# Patient Record
Sex: Male | Born: 1980 | Race: Black or African American | Hispanic: No | Marital: Single | Smoking: Former smoker
Health system: Southern US, Community
[De-identification: ages and names within clinical notes are randomized; demographics above are authoritative.]

## PROBLEM LIST (undated history)

## (undated) DIAGNOSIS — I119 Hypertensive heart disease without heart failure: Secondary | ICD-10-CM

## (undated) DIAGNOSIS — I10 Essential (primary) hypertension: Secondary | ICD-10-CM

## (undated) DIAGNOSIS — M549 Dorsalgia, unspecified: Secondary | ICD-10-CM

## (undated) DIAGNOSIS — Z9889 Other specified postprocedural states: Secondary | ICD-10-CM

## (undated) DIAGNOSIS — I428 Other cardiomyopathies: Secondary | ICD-10-CM

## (undated) HISTORY — DX: Other cardiomyopathies: I42.8

## (undated) HISTORY — DX: Other specified postprocedural states: Z98.890

## (undated) HISTORY — DX: Hypertensive heart disease without heart failure: I11.9

---

## 1999-03-25 ENCOUNTER — Emergency Department (HOSPITAL_COMMUNITY): Admission: EM | Admit: 1999-03-25 | Discharge: 1999-03-25 | Payer: Self-pay | Admitting: Emergency Medicine

## 2002-02-26 ENCOUNTER — Emergency Department (HOSPITAL_COMMUNITY): Admission: EM | Admit: 2002-02-26 | Discharge: 2002-02-26 | Payer: Self-pay | Admitting: Emergency Medicine

## 2002-02-26 ENCOUNTER — Encounter: Payer: Self-pay | Admitting: Emergency Medicine

## 2002-03-05 ENCOUNTER — Emergency Department (HOSPITAL_COMMUNITY): Admission: EM | Admit: 2002-03-05 | Discharge: 2002-03-05 | Payer: Self-pay | Admitting: *Deleted

## 2007-01-06 ENCOUNTER — Emergency Department (HOSPITAL_COMMUNITY): Admission: EM | Admit: 2007-01-06 | Discharge: 2007-01-06 | Payer: Self-pay | Admitting: Family Medicine

## 2013-07-09 ENCOUNTER — Encounter (HOSPITAL_COMMUNITY): Payer: Self-pay | Admitting: Emergency Medicine

## 2013-07-09 ENCOUNTER — Emergency Department (INDEPENDENT_AMBULATORY_CARE_PROVIDER_SITE_OTHER)
Admission: EM | Admit: 2013-07-09 | Discharge: 2013-07-09 | Disposition: A | Discharge status: Home / Self Care | Payer: Self-pay | Source: Home / Self Care

## 2013-07-09 DIAGNOSIS — B86 Scabies: Secondary | ICD-10-CM

## 2013-07-09 MED ORDER — PERMETHRIN 5 % EX CREA: TOPICAL_CREAM | CUTANEOUS | Status: DC

## 2013-07-09 NOTE — ED Provider Notes (Signed)
CSN: [id number]     Arrival date & time 07/09/13  1322 History   First MD Initiated Contact with Patient 07/09/13 1728     Chief Complaint  Patient presents with  . Rash   (Consider location/radiation/quality/duration/timing/severity/associated sxs/prior Treatment) HPI Comments: 32 year old male presents with complaints of an itchy rash for possibly 4-6 weeks. It starts out with vesiculopapular lesions in the interdigital web spaces of the hands and in the feet. There are lesions and papules in the hands along the waistband line of the abdomen and in the groin. 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   History reviewed. No pertinent past medical history. History reviewed. No pertinent past surgical history. No family history on file. History  Substance Use Topics  . Smoking status: Current Every Day Smoker  . Smokeless tobacco: Not on file  . Alcohol Use: Yes    Review of Systems  Constitutional: Negative.   Skin: Positive for rash.       As per history of present illness  All other systems reviewed and are negative.    Allergies  Penicillins  Home Medications   Current Outpatient Rx  Name  Route  Sig  Dispense  Refill  . permethrin (ELIMITE) 5 % cream      Apply from chin to feet, rinse off in 8 hours. May repeat in 1 w willeek prn.   1 g   1    BP 190/104  Pulse 102  Temp(Src) 98.7 F (37.1 C) (Oral)  Resp 16  SpO2 100% Physical Exam  Nursing note and vitals reviewed. Constitutional: He appears well-developed and well-nourished. No distress.  Neck: Normal range of motion. Neck supple.  Pulmonary/Chest: Effort normal. No respiratory distress.  Musculoskeletal: He exhibits no edema.  Neurological: He is alert. He exhibits normal muscle tone.  Skin: Skin is warm and dry. Rash noted.  Papulovesicular lesions in the interdigital web spaces of the hands, along the waist where the elastic band of  the underwear touches the skin and in the thighs and scrotum. No erythema. No drainage or bleeding.  Psychiatric: He has a normal mood and affect.    ED Course  Procedures (including critical care time) Labs Review Labs Reviewed - No data to display Imaging Review No results found.    MDM   1. Scabies   2. Scabies infestation      Elimite as dir Discussed  Cleaning and treating sheets, clothes, furniture, pillow , etc. Insturctions  Hayden Rasmussen, NP 07/09/13 1742

## 2013-07-09 NOTE — ED Notes (Signed)
Generalized rash including in between fingers.  Rash itches, worse at night.  Patient reports he has had this for a month.  Patient thinks this rash is scabies

## 2013-07-11 NOTE — ED Provider Notes (Signed)
Medical screening examination/treatment/procedure(s) were performed by a resident physician or non-physician practitioner and as the supervising physician I was immediately available for consultation/collaboration.  Clementeen Graham, MD    Rodolph Bong, MD 07/11/13 [phone]

## 2013-08-16 ENCOUNTER — Ambulatory Visit: Payer: Self-pay | Admitting: Internal Medicine

## 2013-09-27 ENCOUNTER — Ambulatory Visit: Payer: Self-pay | Admitting: Internal Medicine

## 2013-10-29 DIAGNOSIS — Z0000 Encounter for general adult medical examination without abnormal findings: Secondary | ICD-10-CM | POA: Insufficient documentation

## 2013-11-12 DIAGNOSIS — I10 Essential (primary) hypertension: Secondary | ICD-10-CM | POA: Insufficient documentation

## 2013-11-15 ENCOUNTER — Encounter: Payer: Self-pay | Admitting: Medical

## 2014-05-15 ENCOUNTER — Inpatient Hospital Stay (HOSPITAL_COMMUNITY)
Admission: EM | Admit: 2014-05-15 | Discharge: 2014-05-17 | DRG: 287 | Disposition: A | Discharge status: Home / Self Care | Payer: Self-pay | Attending: Cardiovascular Disease | Admitting: Cardiovascular Disease

## 2014-05-15 ENCOUNTER — Other Ambulatory Visit: Payer: Self-pay

## 2014-05-15 ENCOUNTER — Encounter (HOSPITAL_COMMUNITY): Payer: Self-pay | Admitting: Cardiology

## 2014-05-15 ENCOUNTER — Emergency Department (HOSPITAL_COMMUNITY): Payer: Self-pay

## 2014-05-15 DIAGNOSIS — I10 Essential (primary) hypertension: Secondary | ICD-10-CM

## 2014-05-15 DIAGNOSIS — R079 Chest pain, unspecified: Secondary | ICD-10-CM

## 2014-05-15 DIAGNOSIS — I2510 Atherosclerotic heart disease of native coronary artery without angina pectoris: Secondary | ICD-10-CM | POA: Diagnosis present

## 2014-05-15 DIAGNOSIS — E876 Hypokalemia: Secondary | ICD-10-CM | POA: Diagnosis present

## 2014-05-15 DIAGNOSIS — I517 Cardiomegaly: Secondary | ICD-10-CM

## 2014-05-15 DIAGNOSIS — I43 Cardiomyopathy in diseases classified elsewhere: Secondary | ICD-10-CM | POA: Diagnosis present

## 2014-05-15 DIAGNOSIS — I2102 ST elevation (STEMI) myocardial infarction involving left anterior descending coronary artery: Secondary | ICD-10-CM

## 2014-05-15 DIAGNOSIS — I119 Hypertensive heart disease without heart failure: Principal | ICD-10-CM | POA: Diagnosis present

## 2014-05-15 DIAGNOSIS — Z9119 Patient's noncompliance with other medical treatment and regimen: Secondary | ICD-10-CM | POA: Diagnosis present

## 2014-05-15 DIAGNOSIS — I77810 Thoracic aortic ectasia: Secondary | ICD-10-CM | POA: Diagnosis present

## 2014-05-15 DIAGNOSIS — I161 Hypertensive emergency: Secondary | ICD-10-CM | POA: Diagnosis present

## 2014-05-15 HISTORY — DX: Dorsalgia, unspecified: M54.9

## 2014-05-15 HISTORY — DX: Essential (primary) hypertension: I10

## 2014-05-15 LAB — COMPREHENSIVE METABOLIC PANEL
ALT: 17 U/L (ref 0–53)
AST: 29 U/L (ref 0–37)
Albumin: 4 g/dL (ref 3.5–5.2)
Alkaline Phosphatase: 77 U/L (ref 39–117)
Anion gap: 14 (ref 5–15)
BUN: 9 mg/dL (ref 6–23)
CHLORIDE: 95 meq/L — AB (ref 96–112)
CO2: 24 mEq/L (ref 19–32)
Calcium: 9.3 mg/dL (ref 8.4–10.5)
Creatinine, Ser: 0.84 mg/dL (ref 0.50–1.35)
GFR calc Af Amer: >90 mL/min (ref >90)
GFR calc non Af Amer: >90 mL/min (ref >90)
Glucose, Bld: 113 mg/dL — ABNORMAL HIGH (ref 70–99)
Potassium: 4.4 mEq/L (ref 3.7–5.3)
Sodium: 133 mEq/L — ABNORMAL LOW (ref 137–147)
Total Bilirubin: 1.3 mg/dL — ABNORMAL HIGH (ref 0.3–1.2)
Total Protein: 9.2 g/dL — ABNORMAL HIGH (ref 6.0–8.3)

## 2014-05-15 LAB — CBC
HCT: 41.3 % (ref 39.0–52.0)
HEMOGLOBIN: 14.7 g/dL (ref 13.0–17.0)
MCH: 30.6 pg (ref 26.0–34.0)
MCHC: 35.6 g/dL (ref 30.0–36.0)
MCV: 86 fL (ref 78.0–100.0)
Platelets: 322 10*3/uL (ref 150–400)
RBC: 4.8 MIL/uL (ref 4.22–5.81)
RDW: 14.7 % (ref 11.5–15.5)
WBC: 13.2 10*3/uL — ABNORMAL HIGH (ref 4.0–10.5)

## 2014-05-15 LAB — PROTIME-INR
INR: 1.08 (ref 0.00–1.49)
Prothrombin Time: 14.2 seconds (ref 11.6–15.2)

## 2014-05-15 LAB — APTT: APTT: 37 s (ref 24–37)

## 2014-05-15 LAB — I-STAT TROPONIN, ED: Troponin i, poc: 0.06 ng/mL (ref 0.00–0.08)

## 2014-05-15 LAB — MRSA PCR SCREENING: MRSA by PCR: NEGATIVE

## 2014-05-15 MED ORDER — HEPARIN SODIUM (PORCINE) 5000 UNIT/ML IJ SOLN
INTRAMUSCULAR | Status: AC
  Filled 2014-05-15: qty 1

## 2014-05-15 MED ORDER — ONDANSETRON HCL 4 MG/2ML IJ SOLN: 4.0000 mg | Freq: Four times a day (QID) | INTRAMUSCULAR | Status: DC | PRN

## 2014-05-15 MED ORDER — ONDANSETRON HCL 4 MG/2ML IJ SOLN
INTRAMUSCULAR | Status: AC
  Filled 2014-05-15: qty 2

## 2014-05-15 MED ORDER — LABETALOL HCL 5 MG/ML IV SOLN
INTRAVENOUS | Status: AC
  Filled 2014-05-15: qty 4

## 2014-05-15 MED ORDER — LIDOCAINE HCL (PF) 1 % IJ SOLN
INTRAMUSCULAR | Status: AC
  Filled 2014-05-15: qty 30

## 2014-05-15 MED ORDER — CETYLPYRIDINIUM CHLORIDE 0.05 % MT LIQD
7.0000 mL | Freq: Two times a day (BID) | OROMUCOSAL | Status: DC
  Administered 2014-05-15 – 2014-05-17 (×4): 7 mL via OROMUCOSAL

## 2014-05-15 MED ORDER — DIAZEPAM 5 MG PO TABS
5.0000 mg | ORAL_TABLET | Freq: Three times a day (TID) | ORAL | Status: DC | PRN
  Administered 2014-05-16 – 2014-05-17 (×3): 5 mg via ORAL
  Filled 2014-05-15 (×3): qty 1

## 2014-05-15 MED ORDER — CARVEDILOL 12.5 MG PO TABS
12.5000 mg | ORAL_TABLET | Freq: Two times a day (BID) | ORAL | Status: DC
  Administered 2014-05-15 – 2014-05-16 (×2): 12.5 mg via ORAL
  Filled 2014-05-15 (×4): qty 1

## 2014-05-15 MED ORDER — FENTANYL CITRATE 0.05 MG/ML IJ SOLN
INTRAMUSCULAR | Status: AC
  Filled 2014-05-15: qty 2

## 2014-05-15 MED ORDER — HEPARIN SODIUM (PORCINE) 5000 UNIT/ML IJ SOLN
5000.0000 [IU] | Freq: Three times a day (TID) | INTRAMUSCULAR | Status: DC
  Administered 2014-05-15 – 2014-05-17 (×5): 5000 [IU] via SUBCUTANEOUS
  Filled 2014-05-15 (×8): qty 1

## 2014-05-15 MED ORDER — PROMETHAZINE HCL 25 MG/ML IJ SOLN
25.0000 mg | Freq: Once | INTRAMUSCULAR | Status: AC
  Administered 2014-05-15: 25 mg via INTRAVENOUS
  Filled 2014-05-15: qty 1

## 2014-05-15 MED ORDER — METOPROLOL TARTRATE 1 MG/ML IV SOLN: 5.0000 mg | Freq: Once | INTRAVENOUS | Status: DC

## 2014-05-15 MED ORDER — HEPARIN SODIUM (PORCINE) 1000 UNIT/ML IJ SOLN
INTRAMUSCULAR | Status: AC
  Filled 2014-05-15: qty 1

## 2014-05-15 MED ORDER — ACETAMINOPHEN 325 MG PO TABS
650.0000 mg | ORAL_TABLET | ORAL | Status: DC | PRN
  Administered 2014-05-15 – 2014-05-16 (×2): 650 mg via ORAL
  Filled 2014-05-15 (×3): qty 2

## 2014-05-15 MED ORDER — HEPARIN (PORCINE) IN NACL 2-0.9 UNIT/ML-% IJ SOLN
INTRAMUSCULAR | Status: AC
  Filled 2014-05-15: qty 1500

## 2014-05-15 MED ORDER — VERAPAMIL HCL 2.5 MG/ML IV SOLN
INTRAVENOUS | Status: AC
  Filled 2014-05-15: qty 2

## 2014-05-15 MED ORDER — INFLUENZA VAC SPLIT QUAD 0.5 ML IM SUSY
0.5000 mL | PREFILLED_SYRINGE | INTRAMUSCULAR | Status: AC
  Administered 2014-05-17: 0.5 mL via INTRAMUSCULAR
  Filled 2014-05-15: qty 0.5

## 2014-05-15 MED ORDER — SODIUM CHLORIDE 0.9 % IV SOLN
INTRAVENOUS | Status: AC
  Administered 2014-05-15: 15:00:00 via INTRAVENOUS

## 2014-05-15 MED ORDER — ONDANSETRON HCL 4 MG/2ML IJ SOLN
4.0000 mg | Freq: Once | INTRAMUSCULAR | Status: AC
  Administered 2014-05-15: 4 mg via INTRAVENOUS

## 2014-05-15 MED ORDER — METOPROLOL TARTRATE 1 MG/ML IV SOLN
INTRAVENOUS | Status: AC
  Filled 2014-05-15: qty 10

## 2014-05-15 MED ORDER — NITROGLYCERIN IN D5W 200-5 MCG/ML-% IV SOLN
0.0000 ug/min | Freq: Once | INTRAVENOUS | Status: AC
  Administered 2014-05-15: 5 ug/min via INTRAVENOUS

## 2014-05-15 MED ORDER — HEPARIN SODIUM (PORCINE) 5000 UNIT/ML IJ SOLN: 4000.0000 [IU] | Freq: Once | INTRAMUSCULAR | Status: DC

## 2014-05-15 MED ORDER — MIDAZOLAM HCL 2 MG/2ML IJ SOLN
INTRAMUSCULAR | Status: AC
  Filled 2014-05-15: qty 2

## 2014-05-15 MED ORDER — NITROGLYCERIN IN D5W 200-5 MCG/ML-% IV SOLN
2.0000 ug/min | INTRAVENOUS | Status: DC
  Administered 2014-05-15: 40 ug/min via INTRAVENOUS
  Administered 2014-05-16: 50 ug/min via INTRAVENOUS
  Filled 2014-05-15: qty 250

## 2014-05-15 MED ORDER — HYDRALAZINE HCL 20 MG/ML IJ SOLN
INTRAMUSCULAR | Status: AC
  Filled 2014-05-15: qty 1

## 2014-05-15 MED ORDER — AMLODIPINE BESYLATE 10 MG PO TABS
10.0000 mg | ORAL_TABLET | Freq: Every day | ORAL | Status: DC
  Administered 2014-05-15 – 2014-05-17 (×3): 10 mg via ORAL
  Filled 2014-05-15 (×3): qty 1

## 2014-05-15 MED ORDER — NITROGLYCERIN IN D5W 200-5 MCG/ML-% IV SOLN
INTRAVENOUS | Status: AC
  Administered 2014-05-15: 50000 ug
  Filled 2014-05-15: qty 250

## 2014-05-15 MED ORDER — NITROGLYCERIN 1 MG/10 ML FOR IR/CATH LAB
INTRA_ARTERIAL | Status: AC
  Filled 2014-05-15: qty 10

## 2014-05-15 MED ORDER — METOPROLOL TARTRATE 1 MG/ML IV SOLN
5.0000 mg | Freq: Once | INTRAVENOUS | Status: AC
  Administered 2014-05-15: 5 mg via INTRAVENOUS

## 2014-05-15 NOTE — Progress Notes (Signed)
Right radial TR band removed, radial site level 0 & pulse +3.  Gauze dressing applied and patient given instructions on movement of right arm.  Will continue to monitor. [place], Mitzi Hansen

## 2014-05-15 NOTE — H&P (Signed)
   Chief Complaint:  ST elevation MI  HPI:  This is a 33 y.o. male with a past medical history significant for the absence of major medical illnesses. He suspectss he has HBP, never treated. Woke at [facility] with severe precordial heaviness, shooting down the left arm and unable to lie flat due to orthopnea. No such complaints in the past.   PMHx:  Past Medical History  Diagnosis Date  . Hypertension   . Back pain     History reviewed. No pertinent past surgical history.  FAMHx:  History reviewed. No pertinent family history. Grandfather with heart disease but not at young age  SOCHx:   reports that he has been smoking.  He does not have any smokeless tobacco history on file. He reports that he drinks alcohol. He reports that he uses illicit drugs (Marijuana).  ALLERGIES:  Allergies  Allergen Reactions  . Penicillins     ROS: Constitutional: negative for chills, fevers, sweats and weight loss Eyes: negative Ears, nose, mouth, throat, and face: negative Respiratory: positive for dyspnea on exertion, negative for cough, hemoptysis, sputum and wheezing Cardiovascular: positive for chest pain, dyspnea and orthopnea, negative for chest pain, dyspnea, orthopnea and paroxysmal nocturnal dyspnea Gastrointestinal: negative for abdominal pain, change in bowel habits, dysphagia, melena, nausea, reflux symptoms and vomiting Genitourinary:negative Integument/breast: negative Hematologic/lymphatic: negative for bleeding, easy bruising and petechiae Musculoskeletal:negative Neurological: negative Behavioral/Psych: negative Endocrine: negative for diabetic symptoms including polydipsia, polyphagia and polyuria Allergic/Immunologic: negative  HOME MEDS:  (Not in a hospital admission)  LABS/IMAGING: No results found for this or any previous visit (from the past 48 hour(s)). No results found.   CXR shows cardiomegaly and a mildly widened mediastinum  VITALS: Blood pressure 220/146,  pulse 108, temperature 98.9 F (37.2 C), resp. rate 18, SpO2 99 %. Equal BP left and right arms EXAM:  General: Alert, oriented x3, no distress Head: no evidence of trauma, PERRL, EOMI, no exophtalmos or lid lag, no myxedema, no xanthelasma; normal ears, nose and oropharynx Neck: normal jugular venous pulsations and no hepatojugular reflux; brisk carotid pulses without delay and no carotid bruits Chest: clear to auscultation, no signs of consolidation by percussion or palpation, normal fremitus, symmetrical and full respiratory excursions Cardiovascular: normal position and quality of the apical impulse, regular rhythm, normal first heart sound and normal second heart sounds, no rubs, loud S4, no murmur Abdomen: no tenderness or distention, no masses by palpation, no abnormal pulsatility or arterial bruits, normal bowel sounds, no hepatosplenomegaly Extremities: no clubbing, cyanosis or edema; 2+ radial, ulnar and brachial pulses bilaterally; 2+ right femoral, posterior tibial and dorsalis pedis pulses; 2+ left femoral, posterior tibial and dorsalis pedis pulses; no subclavian or femoral bruits Neurological: grossly nonfocal   IMPRESSION: Possible anteroseptal STEMI - symptoms consistent with unstable angina and secondary CHF, but ECG interpretation is not as confident due to marked LVH with prominent repol changes.  Hold off anticoagulants until we know he does not have a dissection  PLAN: Emergency cardiac cath. No anticoagulants until dissection ruled out.  IV beta blockers and NTG. ASA given en route. This procedure has been fully reviewed with the patient and written informed consent has been obtained.   Thurmon Fair, MD, [facility] [phone] office [phone] pager  05/15/2014, 11:24 AM

## 2014-05-15 NOTE — ED Notes (Signed)
Pt placed into gown and on monitor  Upon arrival to room. Pts EKG given to and signed by Dr. Jodi Mourning. Pt monitored by blood pressure, pulse ox, and 12 lead.

## 2014-05-15 NOTE — ED Notes (Signed)
Pt to department via EMS- pt reports that he started having left sided chest pain that started about 7 hours again. Started in the left arm and now in his chest. Pt reports some SOB and diaphoresis. 324 ASA, 3 SL nitro. 18 LAC. Pt reports that he vomited earlier and reports ETOH/[place]. Bp-210/147 HR-106

## 2014-05-15 NOTE — Plan of Care (Signed)
Problem: Consults Goal: Nutrition Consult-if indicated Outcome: Not Applicable Date Met:  05/15/14     

## 2014-05-15 NOTE — ED Notes (Addendum)
Provider at the bedside. Code Stemi called by Dr. Jodi Mourning. Zoll pads placed to patients chest.

## 2014-05-15 NOTE — Progress Notes (Signed)
I responded to STEMI page for 33 year-old patient who was taken to the Cath Lab.  I introduced myself to mother who was with patient's friend in the ED waiting room.  I then had to attend to another call. When I returned, I met family in Cath Lab waiting area.  I checked in with doctors and asked for a time frame for when an update would come.  I returned to family and offered prayer and emotional and spiritual support.  I waited with family until the doctor came to give a report. I then accompanied family to [facility] waiting room and checked in with nurse to inform her that family was waiting to see patient.  I checked in with patient and mom later in the day.  Mom was bedside with patient who was nauseous. Mother is nearly blind and depends heavily on son. She states that they are very close.  She has had a series of her own health issues in the past year, including hypertension (the same problem as her son is having), cervical cancer, which appears to be in remission, and glaucoma. I will recommend spiritual care follow up with mother.    Minus Liberty, chaplain [phone]

## 2014-05-15 NOTE — ED Notes (Signed)
Cardiology at the bedside.

## 2014-05-15 NOTE — CV Procedure (Signed)
    Cardiac Catheterization Procedure Note  Name: Jeremy [name] MRN: [id number] DOB: 1981-02-01  Procedure: Left Heart Cath, Selective Coronary Angiography, LV angiography, Aortic root angiography  Indication: Chest pain, EKG changes concerning for anterior injury (STEMI)   Procedural Details: The right wrist was prepped, draped, and anesthetized with 1% lidocaine. Using the modified Seldinger technique, a 5/6 French Slender sheath was introduced into the right radial artery. 3 mg of verapamil was administered through the sheath, weight-based unfractionated heparin was administered intravenously. Standard Judkins catheters were used for selective coronary angiography, aortic root angiography, and left ventriculography. Catheter exchanges were performed over an exchange length guidewire. There were no immediate procedural complications. A TR band was used for radial hemostasis at the completion of the procedure.  The patient was transferred to the post catheterization recovery area for further monitoring.  Procedural Findings: Hemodynamics: AO 182/119 LV 189/18  Coronary angiography: Coronary dominance: right  Left mainstem: widely patent vessel with mild irregularity, divides into the LAD and left circumflex.  Left anterior descending (LAD): very large vessel that reaches the left ventricular apex. The vessel is patent throughout. 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 There is 25-30% stenosis associated with this. The vessel appears widely patent during diastole. The diagonal branches are patent.  Left circumflex (LCx): very large vessel supplying 2 obtuse marginal branches. No obstructive disease is noted.  Right coronary artery (RCA): large, dominant vessel with minimal luminal irregularity. The acute marginal branch, PDA, and PLA branches are all patent.  Left ventriculography: there is mild diffuse global hypokinesis with severe hypokinesis of the inferior wall.  The LVEF is estimated at 45%.  Aortic root angiogram: The aortic root is dilated. There is no aortic insufficiency or evidence of aortic dissection. Using the radiographic measurement tool, the aortic root dimension at its largest appears to be 4.5 cm in diameter.  Estimated Blood Loss: minimal  Final Conclusions:   1. Widely patent coronary arteries with minimal diffuse irregularity 2. 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 Dilated ascending aorta  Recommendations: IV nitroglycerin and initiation of oral antihypertensive medications. Aim for blood pressure of 160-180 mmHg in this patient who presents with very severe hypertension. Check 2-D echocardiogram.  Tonny Bollman MD, [facility] 05/15/2014, 2:28 PM

## 2014-05-15 NOTE — ED Notes (Signed)
Transported to the cath lab.

## 2014-05-15 NOTE — ED Notes (Signed)
Lopressor 5mg  given. Cards at the bedside

## 2014-05-15 NOTE — ED Provider Notes (Signed)
CSN: [id number]     Arrival date & time 05/15/14  1105 History   First MD Initiated Contact with Patient 05/15/14 1105     Chief Complaint  Patient presents with  . Chest Pain    Patient is a 33 y.o. male presenting with chest pain. The history is provided by the patient and the EMS personnel.  Chest Pain Pain location:  Substernal area and L chest Pain quality: aching   Pain radiates to:  L arm Pain radiates to the back: no   Pain severity:  Severe Onset quality:  Sudden Duration:  2 hours Timing:  Constant Progression:  Improving (with nitro) Chronicity:  New Context: at rest   Relieved by:  Nitroglycerin and aspirin Worsened by:  Nothing tried Ineffective treatments:  None tried Associated symptoms: diaphoresis, nausea, shortness of breath and vomiting   Associated symptoms: no abdominal pain and no fever   Nausea:    Severity:  Mild   Onset quality:  Gradual   Duration:  1 day   Timing:  Constant   Progression:  Resolved Vomiting:    Quality:  Stomach contents   Severity:  Mild   Progression:  Resolved Risk factors: hypertension (uncontrolled), male sex and smoking   Risk factors: no coronary artery disease and no prior DVT/PE     Past Medical History  Diagnosis Date  . Hypertension   . Back pain    History reviewed. No pertinent past surgical history. History reviewed. No pertinent family history. History  Substance Use Topics  . Smoking status: Current Every Day Smoker  . Smokeless tobacco: Not on file  . Alcohol Use: Yes    Review of Systems  Constitutional: Positive for diaphoresis. Negative for fever.  Respiratory: Positive for shortness of breath.   Cardiovascular: Positive for chest pain.  Gastrointestinal: Positive for nausea and vomiting. Negative for abdominal pain.  Skin: Negative for rash.  All other systems reviewed and are negative.     Allergies  Penicillins  Home Medications   Prior to Admission medications   Medication Sig  Start Date End Date Taking? Authorizing Provider  permethrin (ELIMITE) 5 % cream Apply from chin to feet, rinse off in 8 hours. May repeat in 1 w willeek prn. 07/09/13   Hayden Rasmussenavid Mabe, NP   BP 220/146 mmHg  Pulse 108  Temp(Src) 98.9 F (37.2 C)  Resp 18  SpO2 99%   Physical Exam  Constitutional: He is oriented to person, place, and time. He appears well-developed and well-nourished. No distress.  HENT:  Head: Normocephalic and atraumatic.  Right Ear: External ear normal.  Left Ear: External ear normal.  Mouth/Throat: Oropharynx is clear and moist.  Eyes: EOM are normal. Pupils are equal, round, and reactive to light.  Neck: Normal range of motion and phonation normal.  Cardiovascular: Normal rate and regular rhythm.   Pulses:      Radial pulses are 2+ on the right side, and 2+ on the left side.       Dorsalis pedis pulses are 2+ on the right side, and 2+ on the left side.  Pulmonary/Chest: Effort normal and breath sounds normal. No respiratory distress. He has no wheezes. He has no rales.  Abdominal: Soft. He exhibits no distension. There is no tenderness. There is no rebound and no guarding.  Neurological: He is alert and oriented to person, place, and time.  Skin: Skin is warm and dry. No rash noted. He is not diaphoretic.  Psychiatric: He has a normal  mood and affect.  Vitals reviewed.   ED Course  Procedures  Labs Review Labs Reviewed  CBC - Abnormal; Notable for the following:    WBC 13.2 (*)    All other components within normal limits  APTT  PROTIME-INR  COMPREHENSIVE METABOLIC PANEL  I-STAT TROPOININ, ED    Imaging Review No results found.   EKG shows STEMI in anterior leads  MDM   Final diagnoses:  Chest pain    Code STEMI called after obtaining EKG on arrival due to chest pain, diaphoresis, N/V and ST elevation.  Cardiologist arrived at the bedside immediately. Given heparin bolus and started on a drip. Started on a nitro drip. Given aspirin en route.    He remained hemodynamically acceptable in the ED and was taken emergently to the Cath Lab.   This case managed in conjunction with my attending, Dr. Jodi Mourning.     Maxine Glenn, MD 05/15/14 [phone]

## 2014-05-16 DIAGNOSIS — I517 Cardiomegaly: Secondary | ICD-10-CM

## 2014-05-16 DIAGNOSIS — I429 Cardiomyopathy, unspecified: Secondary | ICD-10-CM

## 2014-05-16 LAB — BASIC METABOLIC PANEL
Anion gap: 13 (ref 5–15)
BUN: 11 mg/dL (ref 6–23)
CALCIUM: 9 mg/dL (ref 8.4–10.5)
CO2: 25 mEq/L (ref 19–32)
Chloride: 98 mEq/L (ref 96–112)
Creatinine, Ser: 1.11 mg/dL (ref 0.50–1.35)
GFR calc Af Amer: >90 mL/min (ref >90)
GFR calc non Af Amer: 85 mL/min — ABNORMAL LOW (ref >90)
GLUCOSE: 102 mg/dL — AB (ref 70–99)
Potassium: 3.5 mEq/L — ABNORMAL LOW (ref 3.7–5.3)
SODIUM: 136 meq/L — AB (ref 137–147)

## 2014-05-16 LAB — CBC
HCT: 38.9 % — ABNORMAL LOW (ref 39.0–52.0)
HEMOGLOBIN: 13.6 g/dL (ref 13.0–17.0)
MCH: 30 pg (ref 26.0–34.0)
MCHC: 35 g/dL (ref 30.0–36.0)
MCV: 85.9 fL (ref 78.0–100.0)
PLATELETS: 307 10*3/uL (ref 150–400)
RBC: 4.53 MIL/uL (ref 4.22–5.81)
RDW: 14.7 % (ref 11.5–15.5)
WBC: 10.3 10*3/uL (ref 4.0–10.5)

## 2014-05-16 LAB — POCT I-STAT, CHEM 8
BUN: 7 mg/dL (ref 6–23)
Calcium, Ion: 1.15 mmol/L (ref 1.12–1.23)
Chloride: 99 mEq/L (ref 96–112)
Creatinine, Ser: 0.9 mg/dL (ref 0.50–1.35)
Glucose, Bld: 119 mg/dL — ABNORMAL HIGH (ref 70–99)
HCT: 50 % (ref 39.0–52.0)
Hemoglobin: 17 g/dL (ref 13.0–17.0)
Potassium: 3.7 mEq/L (ref 3.7–5.3)
SODIUM: 136 meq/L — AB (ref 137–147)
TCO2: 24 mmol/L (ref 0–100)

## 2014-05-16 MED ORDER — LISINOPRIL 10 MG PO TABS
10.0000 mg | ORAL_TABLET | Freq: Every day | ORAL | Status: DC
  Administered 2014-05-16: 10 mg via ORAL
  Filled 2014-05-16 (×2): qty 1

## 2014-05-16 MED ORDER — CARVEDILOL 12.5 MG PO TABS
12.5000 mg | ORAL_TABLET | Freq: Once | ORAL | Status: AC
  Administered 2014-05-16: 12.5 mg via ORAL
  Filled 2014-05-16: qty 1

## 2014-05-16 MED ORDER — POTASSIUM CHLORIDE CRYS ER 20 MEQ PO TBCR
40.0000 meq | EXTENDED_RELEASE_TABLET | Freq: Once | ORAL | Status: AC
  Administered 2014-05-16: 40 meq via ORAL
  Filled 2014-05-16: qty 2

## 2014-05-16 MED ORDER — CARVEDILOL 25 MG PO TABS
25.0000 mg | ORAL_TABLET | Freq: Two times a day (BID) | ORAL | Status: DC
  Administered 2014-05-16 – 2014-05-17 (×2): 25 mg via ORAL
  Filled 2014-05-16 (×4): qty 1

## 2014-05-16 NOTE — Progress Notes (Signed)
SUBJECTIVE:  Pt seen and examined in AM. No acute events overnight. Blood pressure has improved to156/81 this AM. He denies chest pain, palpitations, or dyspnea.   OBJECTIVE:   Vitals:   Filed Vitals:   05/16/14 0731 05/16/14 0800 05/16/14 0807 05/16/14 0900  BP: 159/102  173/108 156/81  Pulse: 93  91   Temp: 98.5 F (36.9 C) 98.2 F (36.8 C)    TempSrc: Oral Oral    Resp: 19     Height:      Weight:      SpO2: 92% 94%     I&O's:   Intake/Output Summary (Last 24 hours) at 05/16/14 0928 Last data filed at 05/16/14 0900  Gross per 24 hour  Intake 1794.72 ml  Output    970 ml  Net 824.72 ml   TELEMETRY: Reviewed telemetry pt in normal sinus rhythm.     PHYSICAL EXAM General: Well developed, well nourished, in no acute distress Head:   Normal cephalic and atramatic  Lungs:  Clear bilaterally to auscultation. Heart:  HRRR S1 S2  No JVD.   Abdomen: Abdomen soft and non-tender Msk:  Back normal,  Normal strength and tone for age. Extremities:  No edema.   Neuro: Alert and oriented. Psych:  Normal affect, responds appropriately   LABS: Basic Metabolic Panel:  Recent Labs  16/04/9610/01/15 1114 05/16/14 0243  NA 133* 136*  K 4.4 3.5*  CL 95* 98  CO2 24 25  GLUCOSE 113* 102*  BUN 9 11  CREATININE 0.84 1.11  CALCIUM 9.3 9.0   Liver Function Tests:  Recent Labs  05/15/14 1114  AST 29  ALT 17  ALKPHOS 77  BILITOT 1.3*  PROT 9.2*  ALBUMIN 4.0   No results for input(s): LIPASE, AMYLASE in the last 72 hours. CBC:  Recent Labs  05/15/14 1114 05/16/14 0243  WBC 13.2* 10.3  HGB 14.7 13.6  HCT 41.3 38.9*  MCV 86.0 85.9  PLT 322 307   Cardiac Enzymes: No results for input(s): CKTOTAL, CKMB, CKMBINDEX, TROPONINI in the last 72 hours. BNP: Invalid input(s): POCBNP D-Dimer: No results for input(s): DDIMER in the last 72 hours. Hemoglobin A1C: No results for input(s): HGBA1C in the last 72 hours. Fasting Lipid Panel: No results for input(s): CHOL, HDL,  LDLCALC, TRIG, CHOLHDL, LDLDIRECT in the last 72 hours. Thyroid Function Tests: No results for input(s): TSH, T4TOTAL, T3FREE, THYROIDAB in the last 72 hours.  Invalid input(s): FREET3 Anemia Panel: No results for input(s): VITAMINB12, FOLATE, FERRITIN, TIBC, IRON, RETICCTPCT in the last 72 hours. Coag Panel:   Lab Results  Component Value Date   INR 1.08 05/15/2014    RADIOLOGY: Dg Chest Portable 1 View  05/15/2014   CLINICAL DATA:  Left-sided chest pain and difficulty breathing  EXAM: PORTABLE CHEST - 1 VIEW  COMPARISON:  None.  FINDINGS: There is no edema or consolidation. Heart is enlarged with pulmonary vascularity within normal limits. No adenopathy. No pneumothorax. No bone lesions.  IMPRESSION: Cardiomegaly no edema or consolidation.   Electronically Signed   By: Bretta BangWilliam  Woodruff M.D.   On: 05/15/2014 12:08      ASSESSMENT: Pt is a 33 yo man with pmh of hypertension non-compliant with anti-hypertensive therapy who presented on 11/1 with chest pain and hypertensive emergency s/p nonobstructive cardiac catherization on 11/1 revealing hypertenisve cardiomyopathy.   No further chest pain.  PLAN:    Hypertensive Emergency - Improved to 150's/80's from 220/146 on admission (30% decrease). Currently on amlodipine 10  mg daily, carvedilol 12.5 BID, IV metoprolol 5 mg, and nitroglycerin infusion. Will increase carvedilol to 25 mg BID (to receive additional 12.5 mg dose now) and add lisinopril 10 mg daily (renal function stable) with down titration of nitro drip. Can consider adding HCTZ to regimen if hypertensive off nitro drip. Pt was noncompliant with prinzide at home since May of this year. Case management consult for medication needs (to ensure he is able to afford them). 2D-echo pending.   Otis Brace, MD  PGY-II IMTS 05/16/2014  9:28 AM  I have examined the patient and reviewed assessment and plan and discussed with patient and house staff.  Agree with above as stated.  Transfer  out of ICU when iff NTG gtt.  Stressed importance of compliance to decrease likelihood of complications.  Jocelyne Reinertsen S.

## 2014-05-16 NOTE — Progress Notes (Signed)
Utilization review complete. Mykayla Brinton RN CCM Case Mgmt phone [phone] 

## 2014-05-16 NOTE — Progress Notes (Signed)
CARE MANAGEMENT NOTE 05/16/2014  Patient:  Jeremy [name], Jeremy [name]   Account Number:  [id number]  Date Initiated:  05/16/2014  Documentation initiated by:  [facility]  Subjective/Objective Assessment:   HTN emergency     Action/Plan:   mother lives home, [place] # [phone]   Anticipated DC Date:  05/17/2014   Anticipated DC Plan:  HOME/SELF CARE      DC Planning Services  CM consult  Medication Assistance  PCP issues      Choice offered to / List presented to:             Status of service:  Completed, signed off Medicare Important Message given?   (If response is "NO", the following Medicare IM given date fields will be blank) Date Medicare IM given:   Medicare IM given by:   Date Additional Medicare IM given:   Additional Medicare IM given by:    Discharge Disposition:  HOME/SELF CARE  Per UR Regulation:  Reviewed for med. necessity/level of care/duration of stay  If discussed at Long Length of Stay Meetings, dates discussed:    Comments:  05/16/2014  4:11 PM NCM spoke to pt and appt arranged May 18, 2014 at 0900 am at [facility]. Pt can pick up his Rx from [facility] pharmacy at discounted rate. Pt works part-time. Isidoro Donning RN Case Mgmt phone [phone]  05/16/2014 1110 UR completed. Isidoro Donning RN CCM Case Mgmt phone [phone]

## 2014-05-16 NOTE — Progress Notes (Signed)
NCM spoke to pt and appt arranged May 18, 2014 at 0900 am. Pt can pick up his Rx from [facility] pharmacy at discounted rate. Pt works part-time. Isidoro Donning RN Case Mgmt phone [phone]

## 2014-05-16 NOTE — Progress Notes (Signed)
Echocardiogram 2D Echocardiogram has been performed.  Jeremy [name] 05/16/2014, 10:47 AM

## 2014-05-17 ENCOUNTER — Telehealth: Payer: Self-pay | Admitting: Physician Assistant

## 2014-05-17 ENCOUNTER — Encounter (HOSPITAL_COMMUNITY): Payer: Self-pay | Admitting: Physician Assistant

## 2014-05-17 DIAGNOSIS — R072 Precordial pain: Secondary | ICD-10-CM

## 2014-05-17 DIAGNOSIS — I519 Heart disease, unspecified: Secondary | ICD-10-CM

## 2014-05-17 LAB — BASIC METABOLIC PANEL
Anion gap: 13 (ref 5–15)
BUN: 13 mg/dL (ref 6–23)
CO2: 25 mEq/L (ref 19–32)
Calcium: 8.8 mg/dL (ref 8.4–10.5)
Chloride: 97 mEq/L (ref 96–112)
Creatinine, Ser: 1.01 mg/dL (ref 0.50–1.35)
GFR calc Af Amer: >90 mL/min (ref >90)
GLUCOSE: 89 mg/dL (ref 70–99)
POTASSIUM: 3.6 meq/L — AB (ref 3.7–5.3)
Sodium: 135 mEq/L — ABNORMAL LOW (ref 137–147)

## 2014-05-17 MED ORDER — LISINOPRIL 20 MG PO TABS
20.0000 mg | ORAL_TABLET | Freq: Every day | ORAL | Status: DC
  Administered 2014-05-17: 20 mg via ORAL
  Filled 2014-05-17: qty 1

## 2014-05-17 MED ORDER — CARVEDILOL 25 MG PO TABS: 25.0000 mg | ORAL_TABLET | Freq: Two times a day (BID) | ORAL | Status: DC

## 2014-05-17 MED ORDER — POTASSIUM CHLORIDE CRYS ER 20 MEQ PO TBCR
40.0000 meq | EXTENDED_RELEASE_TABLET | Freq: Once | ORAL | Status: AC
  Administered 2014-05-17: 40 meq via ORAL
  Filled 2014-05-17: qty 2

## 2014-05-17 MED ORDER — AMLODIPINE BESYLATE 10 MG PO TABS: 10.0000 mg | ORAL_TABLET | Freq: Every day | ORAL | Status: DC

## 2014-05-17 MED ORDER — LISINOPRIL 20 MG PO TABS: 20.0000 mg | ORAL_TABLET | Freq: Every day | ORAL | Status: DC

## 2014-05-17 NOTE — Plan of Care (Signed)
Problem: Phase II Progression Outcomes Goal: Discharge plan in place and appropriate Outcome: Completed/Met Date Met:  05/17/14 Goal: Pain controlled with appropriate interventions Outcome: Completed/Met Date Met:  05/17/14 Goal: Hemodynamically stable Outcome: Progressing Goal: Ambulates up to 600 ft. in hall x 1 Outcome: Completed/Met Date Met:  59/16/38 Goal: Complications resolved/controlled Outcome: Progressing Goal: Tolerates diet Outcome: Completed/Met Date Met:  05/17/14 Goal: Activity appropriate for discharge plan Outcome: Completed/Met Date Met:  05/17/14 Goal: Vascular site scale level 0 - I Vascular Site Scale Level 0: No bruising/bleeding/hematoma Level I (Mild): Bruising/Ecchymosis, minimal bleeding/ooozing, palpable hematoma < 3 cm Level II (Moderate): Bleeding not affecting hemodynamic parameters, pseudoaneurysm, palpable hematoma > 3 cm Level III (Severe) Bleeding which affects hemodynamic parameters or retroperitoneal hemorrhage  Outcome: Completed/Met Date Met:  05/17/14 Goal: Other Discharge Outcomes/Goals Outcome: Completed/Met Date Met:  05/17/14

## 2014-05-17 NOTE — Discharge Summary (Signed)
Discharge Summary   Patient ID: Jeremy [name] MRN: [id number], DOB/AGE: 1980-10-06 33 y.o. Admit date: 05/15/2014 D/C date:     05/17/2014  Primary Cardiologist: Dr. Amanda Cockayne  Principal Problem:   Hypertensive emergency Active Problems:   Chest pain   LV dysfunction   CAD (coronary artery disease)   Hypertension    Admission Dates: 05/15/14-05/17/14 Discharge Diagnosis: Hypertensive emergency s/p LHC with non-obst CAD and hypertenisve cardiomyopathy  HPI: Jeremy [name] is a 33 y.o. male with a history of hypertension non-compliant with anti-hypertensive therapy who presented on 11/1 with chest pain and hypertensive emergency. Initially he was thought to have ST elevation on ECG and was taken back for emergent cardiac cath. He is now s/p LHC on 11/1 revealing nonobstructive CAD and hypertenisve cardiomyopathy.  Hospital Course  Hypertensive Emergency - 220/146 on admission.  -- Currently on amlodipine 10 mg daily, carvedilol 25mg  BID, and newly started lisinopril 10 mg daily which will increase 20 mg daily (normal renal function) prior to discharge.    -- Pt to obtain meds from [facility].  -- His BP has improved this AM to 166/111. Patient very eager to leave. Will have close follow up in the clinic next week.  Left Ventricular dysfunction with G1DD - EF 35 to 40% with no signs of heart failure most likely in setting of uncontrolled hypertension.  -- Continue ACE and BB  Mild Hypokalemia - 3.6 this AM, received kdur x1 this AM. BMET tomorrow at follow up.    The patient has had an uncomplicated hospital course and is recovering well. The radial catheter site is stable. He has been seen by Dr. Eldridge Dace today and deemed ready for discharge home. All follow-up appointments have been scheduled. Discharge medications are listed below.   Discharge Vitals: Blood pressure 172/102, pulse 86, temperature 98.3 F (36.8 C), temperature source Oral, resp. rate  16, height 6' (1.829 m), weight 229 lb 4.5 oz (104 kg), SpO2 93 %.  Labs: Lab Results  Component Value Date   WBC 10.3 05/16/2014   HGB 13.6 05/16/2014   HCT 38.9* 05/16/2014   MCV 85.9 05/16/2014   PLT 307 05/16/2014    Recent Labs Lab 05/15/14 1114  05/17/14 0240  NA 133*  < > 135*  K 4.4  < > 3.6*  CL 95*  < > 97  CO2 24  < > 25  BUN 9  < > 13  CREATININE 0.84  < > 1.01  CALCIUM 9.3  < > 8.8  PROT 9.2*  --   --   BILITOT 1.3*  --   --   ALKPHOS 77  --   --   ALT 17  --   --   AST 29  --   --   GLUCOSE 113*  < > 89  < > = values in this interval not displayed. No results for input(s): CKTOTAL, CKMB, TROPONINI in the last 72 hours.   Diagnostic Studies/Procedures   Dg Chest Portable 1 View  05/15/2014   CLINICAL DATA:  Left-sided chest pain and difficulty breathing  EXAM: PORTABLE CHEST - 1 VIEW  COMPARISON:  None.  FINDINGS: There is no edema or consolidation. Heart is enlarged with pulmonary vascularity within normal limits. No adenopathy. No pneumothorax. No bone lesions.  IMPRESSION: Cardiomegaly no edema or consolidation.   Electronically Signed   By: Bretta Bang M.D.   On: 05/15/2014 12:08       Cardiac Catheterization Procedure Note  Name: Jeremy [name] MRN: [id number] DOB: [birth year] Procedure: Left Heart Cath, Selective Coronary Angiography, LV angiography, Aortic root angiography Indication: Chest pain, EKG changes concerning for anterior injury (STEMI) Procedural Details: The right wrist was prepped, draped, and anesthetized with 1% lidocaine. Using the modified Seldinger technique, a 5/6 French Slender sheath was introduced into the right radial artery. 3 mg of verapamil was administered through the sheath, weight-based unfractionated heparin was administered intravenously. Standard Judkins catheters were used for selective coronary angiography, aortic root angiography, and left ventriculography. Catheter exchanges were  performed over an exchange length guidewire. There were no immediate procedural complications. A TR band was used for radial hemostasis at the completion of the procedure. The patient was transferred to the post catheterization recovery area for further monitoring.  Procedural Findings: Hemodynamics: AO 182/119 LV 189/18 Coronary angiography: Coronary dominance: right Left mainstem: widely patent vessel with mild irregularity, divides into the LAD and left circumflex. Left anterior descending (LAD): very large vessel that reaches the left ventricular apex. The vessel is patent throughout. 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 There is 25-30% stenosis associated with this. The vessel appears widely patent during diastole. The diagonal branches are patent. Left circumflex (LCx): very large vessel supplying 2 obtuse marginal branches. No obstructive disease is noted. Right coronary artery (RCA): large, dominant vessel with minimal luminal irregularity. The acute marginal branch, PDA, and PLA branches are all patent. Left ventriculography: there is mild diffuse global hypokinesis with severe hypokinesis of the inferior wall. The LVEF is estimated at 45%. Aortic root angiogram: The aortic root is dilated. There is no aortic insufficiency or evidence of aortic dissection. Using the radiographic measurement tool, the aortic root dimension at its largest appears to be 4.5 cm in diameter. Estimated Blood Loss: minimal Final Conclusions:  1. Widely patent coronary arteries with minimal diffuse irregularity 2. 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 Dilated ascending aorta Recommendations: IV nitroglycerin and initiation of oral antihypertensive medications. Aim for blood pressure of 160-180 mmHg in this patient who presents with very severe hypertension. Check 2-D echocardiogram.    Discharge Medications     Medication List      STOP taking these medications        ibuprofen 200 MG tablet  Commonly known as:  ADVIL,MOTRIN      TAKE these medications        acetaminophen 500 MG tablet  Commonly known as:  TYLENOL  Take 1,000 mg by mouth 3 (three) times daily as needed (pain).     amLODipine 10 MG tablet  Commonly known as:  NORVASC  Take 1 tablet (10 mg total) by mouth daily.     carvedilol 25 MG tablet  Commonly known as:  COREG  Take 1 tablet (25 mg total) by mouth 2 (two) times daily with a meal.     lisinopril 20 MG tablet  Commonly known as:  PRINIVIL,ZESTRIL  Take 1 tablet (20 mg total) by mouth daily.     permethrin 5 % cream  Commonly known as:  ELIMITE  Apply from chin to feet, rinse off in 8 hours. May repeat in 1 w willeek prn.        Disposition   The patient will be discharged in stable condition to home.  Follow-up Information    Follow up with [facility]    .   Why:  appointment May 18, 2014 at 0900 am, Wednesday  Contact information:   [address] [place] [address] [phone]      Follow up with Tereso Newcomer, PA-C On 05/24/2014.   Specialty:  Physician Assistant   Why:  @ 10 am   Contact information:   [address] [place] [address] [phone]         Duration of Discharge Encounter: Greater than 30 minutes including physician and PA time.  SignedCline Crock R PA-C 05/17/2014, 12:29 PM   I have examined the patient and reviewed assessment and plan and discussed with patient.  Agree with above as stated.  Patient seen and examined. BP better yesterday. Increase lisinopril to 20 mg daily. Continue amlodipine and Coreg. Plan d/c later today. F/u tomorrow. Meds to be abtained at the [facility] center.  Stressed importance of long term BP management to avoid renal, cardiac and ophthalmologic complications.  Kathrynn Backstrom S.

## 2014-05-17 NOTE — Progress Notes (Signed)
Patient seen and examined.  BP better yesterday.  Increase lisinopril to 20 mg daily.  Continue amlodipine and Coreg.  Plan d/c later today.  F/u tomorrow.  Meds to be abtained at the [facility] wellness center.

## 2014-05-17 NOTE — Plan of Care (Signed)
Problem: Phase I Progression Outcomes Goal: Voiding-avoid urinary catheter unless indicated Outcome: Completed/Met Date Met:  05/17/14

## 2014-05-17 NOTE — Progress Notes (Signed)
Discharge education done per protocol, IV removed, pt ride at hospital to pick him up. Pt dressed. Leaving with black bag, charger, and clothes.

## 2014-05-17 NOTE — Progress Notes (Signed)
SUBJECTIVE:  Pt seen and examined in AM. No acute events overnight. He is off nitro drip since yesterday afternoon. Blood pressure yesterday 140-150s, this AM 179/106 (due for AM meds). He denies chest pain, palpitations, dyspnea, headache, lightheadedness, or LE edema.   OBJECTIVE:   Vitals:   Filed Vitals:   05/16/14 2200 05/17/14 0000 05/17/14 0400 05/17/14 0600  BP: 159/101   179/106  Pulse:      Temp:  97.8 F (36.6 C) 99 F (37.2 C)   TempSrc:  Oral Oral   Resp:  16 20   Height:      Weight:      SpO2:  93%     I&O's:    Intake/Output Summary (Last 24 hours) at 05/17/14 0233 Last data filed at 05/17/14 0400  Gross per 24 hour  Intake   1581 ml  Output   1050 ml  Net    531 ml   TELEMETRY: Reviewed telemetry pt in normal sinus rhythm.     PHYSICAL EXAM General: Well developed, well nourished, in no acute distress Head:   Normal cephalic and atramatic  Lungs:  Clear bilaterally to auscultation. Heart:  HRRR S1 S2  No JVD.   Abdomen: Abdomen soft and non-tender Msk:  Back normal,  Normal strength and tone for age. Extremities:  No edema.   Neuro: Alert and oriented. Psych:  Normal affect, responds appropriately   LABS: Basic Metabolic Panel:  Recent Labs  43/56/86 0243 05/17/14 0240  NA 136* 135*  K 3.5* 3.6*  CL 98 97  CO2 25 25  GLUCOSE 102* 89  BUN 11 13  CREATININE 1.11 1.01  CALCIUM 9.0 8.8   Liver Function Tests:  Recent Labs  05/15/14 1114  AST 29  ALT 17  ALKPHOS 77  BILITOT 1.3*  PROT 9.2*  ALBUMIN 4.0   No results for input(s): LIPASE, AMYLASE in the last 72 hours. CBC:  Recent Labs  05/15/14 1114 05/15/14 1301 05/16/14 0243  WBC 13.2*  --  10.3  HGB 14.7 17.0 13.6  HCT 41.3 50.0 38.9*  MCV 86.0  --  85.9  PLT 322  --  307   Cardiac Enzymes: No results for input(s): CKTOTAL, CKMB, CKMBINDEX, TROPONINI in the last 72 hours. BNP: Invalid input(s): POCBNP D-Dimer: No results for input(s): DDIMER in the last 72  hours. Hemoglobin A1C: No results for input(s): HGBA1C in the last 72 hours. Fasting Lipid Panel: No results for input(s): CHOL, HDL, LDLCALC, TRIG, CHOLHDL, LDLDIRECT in the last 72 hours. Thyroid Function Tests: No results for input(s): TSH, T4TOTAL, T3FREE, THYROIDAB in the last 72 hours.  Invalid input(s): FREET3 Anemia Panel: No results for input(s): VITAMINB12, FOLATE, FERRITIN, TIBC, IRON, RETICCTPCT in the last 72 hours. Coag Panel:   Lab Results  Component Value Date   INR 1.08 05/15/2014    RADIOLOGY: Dg Chest Portable 1 View  05/15/2014   CLINICAL DATA:  Left-sided chest pain and difficulty breathing  EXAM: PORTABLE CHEST - 1 VIEW  COMPARISON:  None.  FINDINGS: There is no edema or consolidation. Heart is enlarged with pulmonary vascularity within normal limits. No adenopathy. No pneumothorax. No bone lesions.  IMPRESSION: Cardiomegaly no edema or consolidation.   Electronically Signed   By: Bretta Bang M.D.   On: 05/15/2014 12:08      ASSESSMENT: Pt is a 33 yo man with pmh of hypertension non-compliant with anti-hypertensive therapy who presented on 11/1 with chest pain and hypertensive emergency s/p nonobstructive  cardiac catherization on 11/1 revealing hypertenisve cardiomyopathy with no further episodes of chest pain since admission.  PLAN:    Hypertensive Emergency - Yesterday improved to 150's/80's from 220/146 on admission. Currently on amlodipine 10 mg daily, carvedilol 25mg  BID, and newly started lisinopril 10 mg daily which will increase 20 mg daily (normal renal function). Can consider adding HCTZ to regimen as outpatient in setting of left ventricular dysfunction. Pt has follow-up appointment tomorrow. Pt was noncompliant with prinzide at home since May of this year. [facility]. Plan to discharge today after AM meds given and improvement of blood pressure.  Left Ventricular dysfunction with grade 1 diastolic dysfunction -  EF 35 to 40% with no signs of heart failure most likely in setting of uncontrolled hypertension.   Mild Hypokalemia - 3.6 this AM, received kdur x1 this AM.    Otis BraceABBANI, MARJAN, MD  PGY-II IMTS 05/17/2014  8:21 AM  I have examined the patient and reviewed assessment and plan and discussed with patient.  Agree with above as stated.  Patient seen and examined. BP better yesterday. Increase lisinopril to 20 mg daily. Continue amlodipine and Coreg. Plan d/c later today. F/u tomorrow. Med[facility] to [facility]. Consider spironolactone if he can be compliant with the lab followup requirements.  Letisia Schwalb S.

## 2014-05-17 NOTE — Telephone Encounter (Signed)
Pt discharge today 05/17/14 from hospital.  Triage to follow-up with TCM call on 05/18/14

## 2014-05-17 NOTE — Telephone Encounter (Signed)
New message     TCM appt on 05-24-14 with Lorin Picket

## 2014-05-18 ENCOUNTER — Encounter: Payer: Self-pay | Admitting: Physician Assistant

## 2014-05-18 NOTE — Telephone Encounter (Signed)
Patient contacted regarding discharge from [facility] on 05/17/14 post catheterization.   Patient understands to follow up with Tereso Newcomer 05/24/14 at 10.00 am on  at [facility]. Office. Patient understands discharge instructions? yes Patient understands medications and regiment? yes Patient understands to bring all medications to this visit? Yes  Patient states his (R) arm site of cath does not have any swelling, redness or numbness.  Still some tenderness. Verbalizes understanding to call if any excessive redness or swelling develops.

## 2014-05-20 ENCOUNTER — Encounter: Payer: Self-pay | Admitting: *Deleted

## 2014-05-24 ENCOUNTER — Encounter: Payer: Self-pay | Admitting: Physician Assistant

## 2014-05-24 ENCOUNTER — Ambulatory Visit (INDEPENDENT_AMBULATORY_CARE_PROVIDER_SITE_OTHER): Payer: Self-pay | Admitting: Physician Assistant

## 2014-05-24 VITALS — BP 140/88 | HR 73 | Ht 72.0 in | Wt 236.0 lb

## 2014-05-24 DIAGNOSIS — I119 Hypertensive heart disease without heart failure: Secondary | ICD-10-CM

## 2014-05-24 DIAGNOSIS — I2510 Atherosclerotic heart disease of native coronary artery without angina pectoris: Secondary | ICD-10-CM

## 2014-05-24 DIAGNOSIS — I428 Other cardiomyopathies: Secondary | ICD-10-CM | POA: Insufficient documentation

## 2014-05-24 DIAGNOSIS — I429 Cardiomyopathy, unspecified: Secondary | ICD-10-CM

## 2014-05-24 LAB — BASIC METABOLIC PANEL
BUN: 13 mg/dL (ref 6–23)
CALCIUM: 8.6 mg/dL (ref 8.4–10.5)
CO2: 26 mEq/L (ref 19–32)
CREATININE: 1 mg/dL (ref 0.4–1.5)
Chloride: 105 mEq/L (ref 96–112)
GFR: 114.5 mL/min (ref >60.00)
Glucose, Bld: 87 mg/dL (ref 70–99)
Potassium: 4.3 mEq/L (ref 3.5–5.1)
SODIUM: 137 meq/L (ref 135–145)

## 2014-05-24 LAB — LIPID PANEL
CHOLESTEROL: 135 mg/dL (ref 0–200)
HDL: 31.8 mg/dL — AB (ref >39.00)
LDL Cholesterol: 89 mg/dL (ref 0–99)
NonHDL: 103.2
TRIGLYCERIDES: 73 mg/dL (ref 0.0–149.0)
Total CHOL/HDL Ratio: 4
VLDL: 14.6 mg/dL (ref 0.0–40.0)

## 2014-05-24 MED ORDER — LISINOPRIL 20 MG PO TABS: 20.0000 mg | ORAL_TABLET | Freq: Two times a day (BID) | ORAL | Status: DC

## 2014-05-24 NOTE — Patient Instructions (Signed)
LAB WORK TODAY; BMET  FASTING LIPID AND BMET 2 WEEKS  Your physician has requested that you have an echocardiogram. Echocardiography is a painless test that uses sound waves to create images of your heart. It provides your doctor with information about the size and shape of your heart and how well your heart's chambers and valves are working. This procedure takes approximately one hour. There are no restrictions for this procedure.  Your physician recommends that you schedule a follow-up appointment in: 1 MONTH WITH SCOTT WEAVER, [facility]  Your physician recommends that you schedule a follow-up appointment in: 3 MONTHS WITH DR. VARANASI AFTER YOUR ECHO HAS BEEN COMPLETED  INCREASE LISINOPRIL TO 20 MG TWICE DAILY

## 2014-05-24 NOTE — Progress Notes (Signed)
**Note Jeremy-Identified via Obfuscation** Cardiology Office Note   Date:  05/24/2014   ID:  Jeremy [name], DOB [birth year], MRN [id number]  PCP:  No primary care provider on file.  Cardiologist:  Dr. Everette RankJay Varanasi      History of Present Illness: Jeremy [name] is a 33 y.o. male with a hx of untreated hypertension. He was admitted 11/1-11/3 with hypertensive emergency.  He presented to the hospital with severe chest pain and ECG changes concerning for anteroseptal STEMI. Emergent cardiac catheterization demonstrated widely patent coronary arteries with minimal diffuse irregularity and mild to moderate segmental LV systolic dysfunction. There was a  suggestion of dilated ascending aorta.  However, 2-D echocardiogram demonstrated normal aortic root and normal ascending aorta. Blood pressure was 220/146 on admission. Medications were adjusted for blood pressure control. He returns for follow-up.  The patient denies any chest pain, significant dyspnea, syncope, orthopnea, PND, edema. He continues have occasional headaches. He denies unilateral weakness, facial droop or speech difficulty. Blood pressure at home is running 144/100. He has seen some readings of 120/80.  Studies:  - LHC (11/15):  Mid LAD with intramyocardial bridge with associated 25-30% stenosis, inferior HK, EF 45%, dilated aortic root approximately 4.5 cm  - Echo (11/15):  Moderate LVH, EF 35-40%, diffuse HK, grade 1 diastolic dysfunction, trivial AI, mild to moderately dilated LAE, mild RVE, aortic root and ascending aorta normal in size   Recent Labs/Images:  05/15/2014: ALT 17 05/16/2014: Hemoglobin 13.6 05/17/2014: BUN 13; Creatinine 1.01; Potassium 3.6*; Sodium 135*   Dg Chest Portable 1 View  05/15/2014   IMPRESSION: Cardiomegaly no edema or consolidation.   Electronically Signed   By: Bretta BangWilliam  Woodruff M.D.   On: 05/15/2014 12:08     Wt Readings from Last 3 Encounters:  05/24/14 236 lb (107.049 kg)  05/15/14 229 lb 4.5 oz (104 kg)     Past  Medical History  Diagnosis Date  . Hypertensive heart disease without congestive heart failure   . Back pain   . Hx of cardiac catheterization     a. 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 NICM (nonischemic cardiomyopathy)     a. EF 35 to 40% by LHC on 05/15/14 likely due to hypertensive CM    Current Outpatient Prescriptions  Medication Sig Dispense Refill  . acetaminophen (TYLENOL) 500 MG tablet Take 1,000 mg by mouth 3 (three) times daily as needed (pain).    Marland Kitchen. amLODipine (NORVASC) 10 MG tablet Take 1 tablet (10 mg total) by mouth daily. 30 tablet 11  . carvedilol (COREG) 25 MG tablet Take 1 tablet (25 mg total) by mouth 2 (two) times daily with a meal. 60 tablet 11  . lisinopril (PRINIVIL,ZESTRIL) 20 MG tablet Take 1 tablet (20 mg total) by mouth daily. 30 tablet 11   No current facility-administered medications for this visit.     Allergies:   Ampicillin and Penicillins   Social History:  The patient  reports that he has been smoking.  He does not have any smokeless tobacco history on file. He reports that he drinks alcohol. He reports that he uses illicit drugs (Marijuana).   Family History:  The patient's family history includes Cancer in his maternal grandmother and mother; Heart attack in his maternal grandfather and mother; Heart failure in his maternal grandfather. There is no history of Stroke.   ROS:  Please see the history of present illness.  All other systems reviewed and negative.    PHYSICAL EXAM: VS:  BP 140/88 mmHg  Pulse 73  Ht 6' (1.829 m)  Wt 236 lb (107.049 kg)  BMI 32.00 kg/m2 Well nourished, well developed, in no acute distress HEENT: normal Neck:  no JVD Cardiac:  normal S1, S2;  RRR; no murmur Lungs:   clear to auscultation bilaterally, no wheezing, rhonchi or rales Abd: soft, nontender, no hepatomegaly Ext:  right wrist without  hematoma or mass; no edema Skin: warm and dry Neuro:  CNs 2-12 intact, no focal abnormalities noted  EKG:  NSR, HR 73, LVH with repol abnormality       ASSESSMENT AND PLAN:  1.  Hypertensive heart disease without heart failure:  BP much better controlled. BPs at home remain above target.  K+ was a little low in the hospital.    -  Check BMET.    -  Increase Lisinopril to 20 mg bid.    -  Check BMET in 2 weeks.    -  He will call with a record of BPs in ~ 2 weeks.    -  Consider adding Spironolactone or BiDil if BP remains uncontrolled.    -  Arrange Lipids for health maintenance. 2.  NICM (nonischemic cardiomyopathy):  Probably related to HTN.  No significant CAD noted on cath.      -  Continue beta blocker, ACEI.    -  Adjust medications as needed for BP.       -  Arrange FU echo in 3 mos.  Disposition:   FU with me in 1 month and Dr. Everette Rank 3 mos (after echo).    Signed, Brynda Rim, MHS 05/24/2014 10:44 AM    [facility] [address] [place], [place], [place]  [address] Phone: [phone]; Fax: [phone]

## 2014-05-27 ENCOUNTER — Other Ambulatory Visit (HOSPITAL_COMMUNITY): Payer: Self-pay

## 2015-03-30 ENCOUNTER — Ambulatory Visit: Payer: Self-pay | Admitting: Internal Medicine

## 2015-04-27 ENCOUNTER — Encounter: Payer: Self-pay | Admitting: Internal Medicine

## 2015-04-27 ENCOUNTER — Ambulatory Visit: Payer: Self-pay | Attending: Internal Medicine | Admitting: Internal Medicine

## 2015-04-27 VITALS — BP 123/88 | HR 62 | Temp 98.0°F | Resp 18 | Ht 73.0 in | Wt 251.1 lb

## 2015-04-27 DIAGNOSIS — F17200 Nicotine dependence, unspecified, uncomplicated: Secondary | ICD-10-CM | POA: Insufficient documentation

## 2015-04-27 DIAGNOSIS — Z79899 Other long term (current) drug therapy: Secondary | ICD-10-CM | POA: Insufficient documentation

## 2015-04-27 DIAGNOSIS — Z880 Allergy status to penicillin: Secondary | ICD-10-CM | POA: Insufficient documentation

## 2015-04-27 DIAGNOSIS — H6123 Impacted cerumen, bilateral: Secondary | ICD-10-CM | POA: Insufficient documentation

## 2015-04-27 DIAGNOSIS — H6120 Impacted cerumen, unspecified ear: Secondary | ICD-10-CM | POA: Insufficient documentation

## 2015-04-27 DIAGNOSIS — I119 Hypertensive heart disease without heart failure: Secondary | ICD-10-CM | POA: Insufficient documentation

## 2015-04-27 DIAGNOSIS — M545 Low back pain: Secondary | ICD-10-CM | POA: Insufficient documentation

## 2015-04-27 DIAGNOSIS — I429 Cardiomyopathy, unspecified: Secondary | ICD-10-CM | POA: Insufficient documentation

## 2015-04-27 LAB — COMPLETE METABOLIC PANEL WITH GFR
ALBUMIN: 4 g/dL (ref 3.6–5.1)
ALT: 14 U/L (ref 9–46)
AST: 19 U/L (ref 10–40)
Alkaline Phosphatase: 69 U/L (ref 40–115)
BILIRUBIN TOTAL: 0.5 mg/dL (ref 0.2–1.2)
BUN: 15 mg/dL (ref 7–25)
CALCIUM: 8.8 mg/dL (ref 8.6–10.3)
CO2: 27 mmol/L (ref 20–31)
CREATININE: 0.98 mg/dL (ref 0.60–1.35)
Chloride: 104 mmol/L (ref 98–110)
GFR, Est African American: >89 mL/min (ref >=60)
GLUCOSE: 85 mg/dL (ref 65–99)
POTASSIUM: 4.3 mmol/L (ref 3.5–5.3)
SODIUM: 138 mmol/L (ref 135–146)
TOTAL PROTEIN: 7.8 g/dL (ref 6.1–8.1)

## 2015-04-27 LAB — CBC WITH DIFFERENTIAL/PLATELET
BASOS PCT: 0 % (ref 0–1)
Basophils Absolute: 0 10*3/uL (ref 0.0–0.1)
Eosinophils Absolute: 0.2 10*3/uL (ref 0.0–0.7)
Eosinophils Relative: 3 % (ref 0–5)
HCT: 42.2 % (ref 39.0–52.0)
HEMOGLOBIN: 14.2 g/dL (ref 13.0–17.0)
LYMPHS ABS: 3.5 10*3/uL (ref 0.7–4.0)
Lymphocytes Relative: 43 % (ref 12–46)
MCH: 29.9 pg (ref 26.0–34.0)
MCHC: 33.6 g/dL (ref 30.0–36.0)
MCV: 88.8 fL (ref 78.0–100.0)
MONO ABS: 0.7 10*3/uL (ref 0.1–1.0)
MONOS PCT: 9 % (ref 3–12)
MPV: 9.4 fL (ref 8.6–12.4)
NEUTROS ABS: 3.6 10*3/uL (ref 1.7–7.7)
NEUTROS PCT: 45 % (ref 43–77)
Platelets: 297 10*3/uL (ref 150–400)
RBC: 4.75 MIL/uL (ref 4.22–5.81)
RDW: 14.8 % (ref 11.5–15.5)
WBC: 8.1 10*3/uL (ref 4.0–10.5)

## 2015-04-27 LAB — LIPID PANEL
Cholesterol: 121 mg/dL — ABNORMAL LOW (ref 125–200)
HDL: 42 mg/dL (ref >=40)
LDL CALC: 64 mg/dL (ref <130)
TRIGLYCERIDES: 77 mg/dL (ref <150)
Total CHOL/HDL Ratio: 2.9 Ratio (ref <=5.0)
VLDL: 15 mg/dL (ref <30)

## 2015-04-27 LAB — POCT GLYCOSYLATED HEMOGLOBIN (HGB A1C): HEMOGLOBIN A1C: 5.1

## 2015-04-27 LAB — TSH: TSH: 1.142 u[IU]/mL (ref 0.350–4.500)

## 2015-04-27 MED ORDER — AMLODIPINE BESYLATE 10 MG PO TABS: 10.0000 mg | ORAL_TABLET | Freq: Every day | ORAL | Status: DC

## 2015-04-27 MED ORDER — CARBAMIDE PEROXIDE 6.5 % OT SOLN: 5.0000 [drp] | Freq: Two times a day (BID) | OTIC | Status: DC

## 2015-04-27 MED ORDER — CARVEDILOL 12.5 MG PO TABS: 12.5000 mg | ORAL_TABLET | Freq: Two times a day (BID) | ORAL | Status: DC

## 2015-04-27 MED ORDER — LISINOPRIL 20 MG PO TABS: 20.0000 mg | ORAL_TABLET | Freq: Every day | ORAL | Status: DC

## 2015-04-27 NOTE — Patient Instructions (Signed)
Cardiomyopathy  Cardiomyopathy is a long-term (chronic) disease of the heart muscle (myocardium). Over time, the heart becomes abnormally large, thick, or stiff. This makes it harder for the heart to pump blood and can lead to heart failure.  There are several types of cardiomyopathy:  · Dilated cardiomyopathy. This type causes the ventricles become large and weak.  · Hypertrophic cardiomyopathy. This type causes the heart muscle to thicken.  · Restrictive cardiomyopathy. This type causes the heart muscle to become rigid and less elastic.  · Ischemic cardiomyopathy. This type involves narrowing arteries that cause the walls of the heart get thinner.  · Peripartum cardiomyopathy. This type occurs during pregnancy or shortly after pregnancy.  CAUSES  The cause of cardiomyopathy is often not known. In some cases, it is passed down (inherited) from a family member who also had cardiomyopathy. The disease may develop as a complication of another medical condition. These conditions can include:  · Diabetes.  · High blood pressure.  · Viral infection of the heart.  · Heart attack.  · Coronary heart disease.  RISK FACTORS  You may be more likely to develop cardiomyopathy if you:  · Have a family history of cardiomyopathy or other heart problems.  · Are overweight or obese.  · Use illegal drugs.  · Abuse alcohol.  · Have diabetes.  · Have another disease that can cause cardiomyopathy as a complication.  SIGNS AND SYMPTOMS  Often, cardiomyopathy has no signs or symptoms. If you do have symptoms, they may include:  · Shortness of breath, especially during activity.  · Fatigue.  · An irregular heartbeat (arrhythmia).  · Dizziness, light-headedness, or fainting.  · Chest pain.  · Swelling in the lower leg or ankle.  DIAGNOSIS  Your health care provider may suspect cardiomyopathy based on your symptoms and medical history. Your health care provider will also do a physical exam. Other tests done may include:  · Blood  tests.  · Imaging studies of your heart. These may be done using:    X-rays to check if your heart is enlarged.    Echocardiogram to show the size of your heart and how well it pumps.    MRI.  · A test to record the electrical activity of your heart (electrocardiogram or ECG).  · A test in which you wear a portable device (event monitor) to record your heart's electrical activity while you go about your day.  · A test to monitor your heart's activity while you exercise (stress test).    · A procedure to check the blood pressure and blood flow in your heart (cardiac catheterization).  · Injection of dye into your arteries before imaging studies are taken (angiogram).  · Removal of a sample of heart tissue (biopsy). The sample is examined for problems.  TREATMENT  Treatment depends on the type of cardiomyopathy you have and the severity of your symptoms. If you are not having any symptoms, you might not need treatment. If you need treatment, it may include:  · Lifestyle changes.    Quit smoking, if you smoke.    Maintain a healthy weight. Lose weight if directed by your health care provider.    Eat a healthy diet. Include plenty of fruits, vegetables, and whole grains.    Get regular exercise. Ask your health care provider to suggest some activities that are good for you.  · Medicine. You may need to take medicine to:    Lower your blood pressure.    Slow down   your heart rate.    Keep your heart beating in a steady rhythm.    Clear excess fluids from your body.    Prevent blood clots.  · Surgery. You may need surgery to:    Repair a defect.    Remove thickened tissue.    Implant a device to treat serious heart rhythm problems (implantable cardioverter-defibrillator or ICD).    Replace your heart (heart transplant) if all other treatments have failed (end stage).  HOME CARE INSTRUCTIONS  · Take medicines only as directed by your health care provider.  · Eat a heart-healthy diet. Work with your health care provider or a  registered dietitian to learn about healthy eating options.  · Maintain a healthy weight and stay physically active.  · Do not use any tobacco products, including cigarettes, chewing tobacco, or electronic cigarettes. If you need help quitting, ask your health care provider.  · Work closely with your health care provider to manage chronic conditions, such as diabetes and high blood pressure.  · Limit alcohol intake to no more than one drink per day for nonpregnant women and no more than two drinks per day for men. One drink equals 12 ounces of beer, 5 ounces of wine, or 1½ ounces of hard liquor.  · Try to get at least 7 hours of sleep each night.  · Find ways to manage stress.  · Keep all follow-up visits as directed by your health care provider. This is important.  SEEK MEDICAL CARE IF:  · Your symptoms get worse, even after treatment.  · You have new symptoms.  SEEK IMMEDIATE MEDICAL CARE IF:  · You have severe chest pain.  · You have shortness of breath.  · You cough up pink, bubbly material.  · You have sudden sweating.  · You feel nauseous and vomit.  · You suddenly become light-headed or dizzy.  · You feel your heart beating very fast.  · It feels like your heart is skipping beats.  These symptoms may represent a serious problem that is an emergency. Do not wait to see if the symptoms will go away. Get medical help right away. Call your local emergency services (911 in the [place] Do not drive yourself to the hospital.     This information is not intended to replace advice given to you by your health care provider. Make sure you discuss any questions you have with your health care provider.     Document Released: 09/13/2004 Document Revised: 07/22/2014 Document Reviewed: 12/09/2013  Elsevier Interactive Patient Education ©2016 [employer]

## 2015-04-27 NOTE — Progress Notes (Signed)
Patient ID: Jeremy [name], male   DOB: 11-18-1980, 34 y.o.   MRN: [id number]   Jeremy [name], is a 34 y.o. male  [id number]  [id number]  DOB - 23-Nov-1980  CC:  Chief Complaint  Patient presents with  . Establish Care       HPI: Jeremy [name] is a 34 y.o. male here today to establish medical care. Patient has history of Hypertension and Nonischemic cardiomyopathy. 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 Patient has not followed up with Cardiology since 05/24/2014. Patient states he is taking his medications differently than prescribed. Patient is taking Coreg 25 mg once per day (prescribed BID). Patient is taking lisinopril 20 mg once per day (prescribed BID). Patient has taken the self altered doses for the last 6 to 8 months and states he feels much better at these levels.  Patient states when he "was taking medication as prescribed he experienced dizziness and weakness. Patient has No headache, No chest pain, No abdominal pain - No Nausea, No new weakness tingling or numbness, No Cough - SOB.  Allergies  Allergen Reactions  . Ampicillin Hives  . Penicillins Hives   Past Medical History  Diagnosis Date  . Hypertensive heart disease without congestive heart failure   . Back pain   . Hx of cardiac catheterization     a. 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 NICM (nonischemic cardiomyopathy) (HCC)     a. EF 35 to 40% by LHC on 05/15/14 likely due to hypertensive CM  . Hypertension    Current Outpatient Prescriptions on File Prior to Visit  Medication Sig Dispense Refill  . acetaminophen (TYLENOL) 500 MG tablet Take 1,000 mg by mouth 3 (three) times daily as needed (pain).     No current  facility-administered medications on file prior to visit.   Family History  Problem Relation Age of Onset  . Heart failure Maternal Grandfather   . Stroke Neg Hx   . Heart attack Mother   . Heart attack Maternal Grandfather   . Cancer Mother   . Cancer Maternal Grandmother    Social History   Social History  . Marital Status: Single    Spouse Name: N/A  . Number of Children: N/A  . Years of Education: N/A   Occupational History  . Not on file.   Social History Main Topics  . Smoking status: Current Every Day Smoker  . Smokeless tobacco: Not on file  . Alcohol Use: Yes  . Drug Use: Yes    Special: Marijuana  . Sexual Activity: Not on file   Other Topics Concern  . Not on file   Social History Narrative    Review of Systems  Constitutional: Negative.   HENT: Negative.   Eyes: Negative.   Respiratory: Negative.   Cardiovascular: Negative.   Gastrointestinal: Negative.   Genitourinary: Negative.   Musculoskeletal: Positive for back pain.       Low back pain  Skin: Negative.   Neurological: Negative.   Endo/Heme/Allergies: Negative.   Psychiatric/Behavioral: Negative.      Objective:   Filed Vitals:   04/27/15 1140  BP: 123/88  Pulse: 62  Temp: 98 F (36.7 C)  Resp: 18   Physical Exam  Constitutional: He is  oriented to person, place, and time. He appears well-developed and well-nourished.  HENT:  Head: Normocephalic and atraumatic.  Right Ear: External ear normal.  Left Ear: External ear normal.  Nose: Nose normal.  Mouth/Throat: Oropharynx is clear and moist.  Bilateral cerumen impaction  Eyes: Conjunctivae and EOM are normal. Pupils are equal, round, and reactive to light.  Neck: Normal range of motion. Neck supple.  Cardiovascular: Normal rate, regular rhythm, normal heart sounds and intact distal pulses.   Pulmonary/Chest: Effort normal and breath sounds normal.  Abdominal: Soft. Bowel sounds are normal.  Musculoskeletal: Normal range of  motion.  Neurological: He is alert and oriented to person, place, and time. He has normal reflexes.  Skin: Skin is warm and dry.  Psychiatric: He has a normal mood and affect. His behavior is normal. Judgment and thought content normal.  Nursing note and vitals reviewed.   Lab Results  Component Value Date   WBC 10.3 05/16/2014   HGB 13.6 05/16/2014   HCT 38.9* 05/16/2014   MCV 85.9 05/16/2014   PLT 307 05/16/2014   Lab Results  Component Value Date   CREATININE 1.0 05/24/2014   BUN 13 05/24/2014   NA 137 05/24/2014   K 4.3 05/24/2014   CL 105 05/24/2014   CO2 26 05/24/2014    Lab Results  Component Value Date   HGBA1C 5.10 04/27/2015   Lipid Panel     Component Value Date/Time   CHOL 135 05/24/2014 1137   TRIG 73.0 05/24/2014 1137   HDL 31.80* 05/24/2014 1137   CHOLHDL 4 05/24/2014 1137   VLDL 14.6 05/24/2014 1137   LDLCALC 89 05/24/2014 1137       Assessment and plan:   Jeremy [name] was seen today for establish care.  Diagnoses and all orders for this visit:  Hypertensive heart disease without heart failure  -     amLODipine (NORVASC) 10 MG tablet; Take 1 tablet (10 mg total) by mouth daily. -     carvedilol (COREG) 12.5 MG tablet; Take 1 tablet (12.5 mg total) by mouth 2 (two) times daily with a meal. -     lisinopril (PRINIVIL,ZESTRIL) 20 MG tablet; Take 1 tablet (20 mg total) by mouth daily. -     CBC with Differential/Platelet -     COMPLETE METABOLIC PANEL WITH GFR -     Lipid panel -     POCT glycosylated hemoglobin (Hb A1C) -     TSH  -     Echocardiogram; Future  We discussed target BP range and blood pressure goal. Patient was advised to check BP regularly and to call us back or report to clinic if the numbers are consistently higher than 140/90. We discussed the importance of compliance with medications and DASH diet. Patient verbalized understanding of all instructions and the consequences of uncontrolled hypertensions.  Excess ear wax,  bilateral  -     carbamide peroxide (EAR WAX REMOVAL DROPS) 6.5 % otic solution; Place 5 drops into both ears 2 (two) times daily.   Return in about 6 months (around 10/26/2015), or if symptoms worsen or fail to improve, for Heart Failure and Hypertension.  Patient's back pain is relieved by otc advil and patient refused further exam and diagnostic work up for back pain at this time. The patient was given clear instructions to go to ER or return to medical center if symptoms don't improve, worsen or new problems develop. The patient verbalized understanding. The patient was told to call to get  lab results if they haven't heard anything in the next week.     Stephanie Coup, RN, BSN, AGNP, APP Student [facility] [phone] 04/27/2015, 1:05 PM

## 2015-04-27 NOTE — Progress Notes (Signed)
Patient denies pain at this time. Patient here to Establish care.  Patient has taken medications this morning, patient has not eaten.

## 2015-05-02 ENCOUNTER — Telehealth: Payer: Self-pay | Admitting: *Deleted

## 2015-05-02 NOTE — Telephone Encounter (Signed)
Patient verified DOB Patient made aware of Lab results being within normal limit. Patient had no further questions.

## 2016-07-30 ENCOUNTER — Telehealth: Payer: Self-pay | Admitting: Internal Medicine

## 2016-07-30 NOTE — Telephone Encounter (Signed)
Pt. Mother called stating that pt. Has sore throat, cough and states that pt. Has no body pain but just feels sick. Please f/u with pt.

## 2016-08-02 NOTE — Telephone Encounter (Signed)
Office visit offered for today. Pt states he is dealing with the sx's: cough, cold sx's , feels like it is breaking up. Informed to call office if we could be of any assistance.

## 2016-08-05 ENCOUNTER — Other Ambulatory Visit: Payer: Self-pay | Admitting: Internal Medicine

## 2016-08-05 DIAGNOSIS — I119 Hypertensive heart disease without heart failure: Secondary | ICD-10-CM

## 2016-09-09 ENCOUNTER — Telehealth: Payer: Self-pay | Admitting: Internal Medicine

## 2016-09-09 NOTE — Telephone Encounter (Signed)
Patient doesn't have a PCP listed and has not been seen since October 2016 - will forward to Dr. Hyman Hopes.

## 2016-09-09 NOTE — Telephone Encounter (Signed)
Patient called the office to request a month supply of lisinopril (PRINIVIL,ZESTRIL) 20 MG tablet, carvedilol (COREG) 12.5 MG tablet, amLODipine (NORVASC) 10 MG tablet. Please send it to [facility] on [address].   Thank you.

## 2016-09-11 ENCOUNTER — Other Ambulatory Visit: Payer: Self-pay | Admitting: Internal Medicine

## 2016-09-11 DIAGNOSIS — I119 Hypertensive heart disease without heart failure: Secondary | ICD-10-CM

## 2016-09-11 NOTE — Telephone Encounter (Signed)
Patient mother called to check on the status of the request for lisinopril (PRINIVIL,ZESTRIL) 20 MG tablet, carvedilol (COREG) 12.5 MG tablet, amLODipine (NORVASC) 10 MG tablet. Please send it to [facility] on [address]

## 2016-09-11 NOTE — Telephone Encounter (Signed)
This patient has not been in the office since October of 2016.

## 2016-09-16 NOTE — Telephone Encounter (Signed)
Patient needs an appointment

## 2016-09-16 NOTE — Telephone Encounter (Signed)
Patient has an appointment this week with provider. Thank you.

## 2016-09-16 NOTE — Telephone Encounter (Signed)
Patient requires an appointment to receive refills.

## 2016-09-17 ENCOUNTER — Telehealth: Payer: Self-pay | Admitting: Internal Medicine

## 2016-09-17 NOTE — Telephone Encounter (Signed)
Patient's mother called the office to request medication refill for a second time. Due to son's work schedule his appt was cancelled. I informed mother that until son is seen by provider medication will not be refilled. Mother was upset. No appt available until 3/12. Pt is out of medication.  lisinopril (PRINIVIL,ZESTRIL) 20 MG tablet  amLODipine (NORVASC) 10 MG tablet  carvedilol (COREG) 12.5 MG tablet   Thank you.

## 2016-09-18 ENCOUNTER — Ambulatory Visit: Payer: Self-pay | Admitting: Internal Medicine

## 2016-10-02 ENCOUNTER — Encounter: Payer: Self-pay | Admitting: Internal Medicine

## 2016-10-02 ENCOUNTER — Ambulatory Visit: Payer: Self-pay | Attending: Internal Medicine | Admitting: Internal Medicine

## 2016-10-02 VITALS — BP 135/86 | HR 98 | Temp 98.7°F | Resp 18 | Ht 72.0 in | Wt 269.0 lb

## 2016-10-02 DIAGNOSIS — I110 Hypertensive heart disease with heart failure: Secondary | ICD-10-CM | POA: Insufficient documentation

## 2016-10-02 DIAGNOSIS — I119 Hypertensive heart disease without heart failure: Secondary | ICD-10-CM

## 2016-10-02 DIAGNOSIS — I429 Cardiomyopathy, unspecified: Secondary | ICD-10-CM | POA: Insufficient documentation

## 2016-10-02 DIAGNOSIS — Z880 Allergy status to penicillin: Secondary | ICD-10-CM | POA: Insufficient documentation

## 2016-10-02 DIAGNOSIS — I509 Heart failure, unspecified: Secondary | ICD-10-CM | POA: Insufficient documentation

## 2016-10-02 DIAGNOSIS — F17210 Nicotine dependence, cigarettes, uncomplicated: Secondary | ICD-10-CM | POA: Insufficient documentation

## 2016-10-02 DIAGNOSIS — R51 Headache: Secondary | ICD-10-CM | POA: Insufficient documentation

## 2016-10-02 LAB — COMPLETE METABOLIC PANEL WITH GFR
ALBUMIN: 4.4 g/dL (ref 3.6–5.1)
ALT: 22 U/L (ref 9–46)
AST: 21 U/L (ref 10–40)
Alkaline Phosphatase: 73 U/L (ref 40–115)
BILIRUBIN TOTAL: 0.3 mg/dL (ref 0.2–1.2)
BUN: 14 mg/dL (ref 7–25)
CO2: 28 mmol/L (ref 20–31)
Calcium: 9.3 mg/dL (ref 8.6–10.3)
Chloride: 103 mmol/L (ref 98–110)
Creat: 0.94 mg/dL (ref 0.60–1.35)
GFR, Est African American: >89 mL/min (ref >=60)
GFR, Est Non African American: >89 mL/min (ref >=60)
GLUCOSE: 93 mg/dL (ref 65–99)
Potassium: 4.2 mmol/L (ref 3.5–5.3)
SODIUM: 139 mmol/L (ref 135–146)
Total Protein: 8.2 g/dL — ABNORMAL HIGH (ref 6.1–8.1)

## 2016-10-02 LAB — CBC WITH DIFFERENTIAL/PLATELET
BASOS ABS: 0 {cells}/uL (ref 0–200)
Basophils Relative: 0 %
EOS PCT: 3 %
Eosinophils Absolute: 243 cells/uL (ref 15–500)
HCT: 43.7 % (ref 38.5–50.0)
HEMOGLOBIN: 14.9 g/dL (ref 13.2–17.1)
LYMPHS ABS: 3969 {cells}/uL — AB (ref 850–3900)
Lymphocytes Relative: 49 %
MCH: 29.4 pg (ref 27.0–33.0)
MCHC: 34.1 g/dL (ref 32.0–36.0)
MCV: 86.4 fL (ref 80.0–100.0)
MPV: 8.9 fL (ref 7.5–12.5)
Monocytes Absolute: 405 cells/uL (ref 200–950)
Monocytes Relative: 5 %
NEUTROS ABS: 3483 {cells}/uL (ref 1500–7800)
Neutrophils Relative %: 43 %
PLATELETS: 310 10*3/uL (ref 140–400)
RBC: 5.06 MIL/uL (ref 4.20–5.80)
RDW: 14.8 % (ref 11.0–15.0)
WBC: 8.1 10*3/uL (ref 3.8–10.8)

## 2016-10-02 LAB — TSH: TSH: 1.45 m[IU]/L (ref 0.40–4.50)

## 2016-10-02 LAB — LIPID PANEL
Cholesterol: 163 mg/dL (ref <200)
HDL: 35 mg/dL — ABNORMAL LOW (ref >40)
LDL CALC: 107 mg/dL — AB (ref <100)
Total CHOL/HDL Ratio: 4.7 Ratio (ref <5.0)
Triglycerides: 104 mg/dL (ref <150)
VLDL: 21 mg/dL (ref <30)

## 2016-10-02 MED ORDER — CARVEDILOL 12.5 MG PO TABS: 12.5000 mg | ORAL_TABLET | Freq: Two times a day (BID) | ORAL | 3 refills | Status: DC

## 2016-10-02 MED ORDER — LISINOPRIL 20 MG PO TABS: 20.0000 mg | ORAL_TABLET | Freq: Every day | ORAL | 3 refills | Status: DC

## 2016-10-02 NOTE — Patient Instructions (Signed)
DASH Eating Plan DASH stands for "Dietary Approaches to Stop Hypertension." The DASH eating plan is a healthy eating plan that has been shown to reduce high blood pressure (hypertension). It may also reduce your risk for type 2 diabetes, heart disease, and stroke. The DASH eating plan may also help with weight loss. What are tips for following this plan? General guidelines   Avoid eating more than 2,300 mg (milligrams) of salt (sodium) a day. If you have hypertension, you may need to reduce your sodium intake to 1,500 mg a day.  Limit alcohol intake to no more than 1 drink a day for nonpregnant women and 2 drinks a day for men. One drink equals 12 oz of beer, 5 oz of wine, or 1 oz of hard liquor.  Work with your health care provider to maintain a healthy body weight or to lose weight. Ask what an ideal weight is for you.  Get at least 30 minutes of exercise that causes your heart to beat faster (aerobic exercise) most days of the week. Activities may include walking, swimming, or biking.  Work with your health care provider or diet and nutrition specialist (dietitian) to adjust your eating plan to your individual calorie needs. Reading food labels   Check food labels for the amount of sodium per serving. Choose foods with less than 5 percent of the Daily Value of sodium. Generally, foods with less than 300 mg of sodium per serving fit into this eating plan.  To find whole grains, look for the word "whole" as the first word in the ingredient list. Shopping   Buy products labeled as "low-sodium" or "no salt added."  Buy fresh foods. Avoid canned foods and premade or frozen meals. Cooking   Avoid adding salt when cooking. Use salt-free seasonings or herbs instead of table salt or sea salt. Check with your health care provider or pharmacist before using salt substitutes.  Do not fry foods. Cook foods using healthy methods such as baking, boiling, grilling, and broiling instead.  Cook with  heart-healthy oils, such as olive, canola, soybean, or sunflower oil. Meal planning    Eat a balanced diet that includes:  5 or more servings of fruits and vegetables each day. At each meal, try to fill half of your plate with fruits and vegetables.  Up to 6-8 servings of whole grains each day.  Less than 6 oz of lean meat, poultry, or fish each day. A 3-oz serving of meat is about the same size as a deck of cards. One egg equals 1 oz.  2 servings of low-fat dairy each day.  A serving of nuts, seeds, or beans 5 times each week.  Heart-healthy fats. Healthy fats called Omega-3 fatty acids are found in foods such as flaxseeds and coldwater fish, like sardines, salmon, and mackerel.  Limit how much you eat of the following:  Canned or prepackaged foods.  Food that is high in trans fat, such as fried foods.  Food that is high in saturated fat, such as fatty meat.  Sweets, desserts, sugary drinks, and other foods with added sugar.  Full-fat dairy products.  Do not salt foods before eating.  Try to eat at least 2 vegetarian meals each week.  Eat more home-cooked food and less restaurant, buffet, and fast food.  When eating at a restaurant, ask that your food be prepared with less salt or no salt, if possible. What foods are recommended? The items listed may not be a complete list. Talk   with your dietitian about what dietary choices are best for you. Grains  Whole-grain or whole-wheat bread. Whole-grain or whole-wheat pasta. Brown rice. Oatmeal. Quinoa. Bulgur. Whole-grain and low-sodium cereals. Pita bread. Low-fat, low-sodium crackers. Whole-wheat flour tortillas. Vegetables  Fresh or frozen vegetables (raw, steamed, roasted, or grilled). Low-sodium or reduced-sodium tomato and vegetable juice. Low-sodium or reduced-sodium tomato sauce and tomato paste. Low-sodium or reduced-sodium canned vegetables. Fruits  All fresh, dried, or frozen fruit. Canned fruit in natural juice  (without added sugar). Meat and other protein foods  Skinless chicken or turkey. Ground chicken or turkey. Pork with fat trimmed off. Fish and seafood. Egg whites. Dried beans, peas, or lentils. Unsalted nuts, nut butters, and seeds. Unsalted canned beans. Lean cuts of beef with fat trimmed off. Low-sodium, lean deli meat. Dairy  Low-fat (1%) or fat-free (skim) milk. Fat-free, low-fat, or reduced-fat cheeses. Nonfat, low-sodium ricotta or cottage cheese. Low-fat or nonfat yogurt. Low-fat, low-sodium cheese. Fats and oils  Soft margarine without trans fats. Vegetable oil. Low-fat, reduced-fat, or light mayonnaise and salad dressings (reduced-sodium). Canola, safflower, olive, soybean, and sunflower oils. Avocado. Seasoning and other foods  Herbs. Spices. Seasoning mixes without salt. Unsalted popcorn and pretzels. Fat-free sweets. What foods are not recommended? The items listed may not be a complete list. Talk with your dietitian about what dietary choices are best for you. Grains  Baked goods made with fat, such as croissants, muffins, or some breads. Dry pasta or rice meal packs. Vegetables  Creamed or fried vegetables. Vegetables in a cheese sauce. Regular canned vegetables (not low-sodium or reduced-sodium). Regular canned tomato sauce and paste (not low-sodium or reduced-sodium). Regular tomato and vegetable juice (not low-sodium or reduced-sodium). Pickles. Olives. Fruits  Canned fruit in a light or heavy syrup. Fried fruit. Fruit in cream or butter sauce. Meat and other protein foods  Fatty cuts of meat. Ribs. Fried meat. Bacon. Sausage. Bologna and other processed lunch meats. Salami. Fatback. Hotdogs. Bratwurst. Salted nuts and seeds. Canned beans with added salt. Canned or smoked fish. Whole eggs or egg yolks. Chicken or turkey with skin. Dairy  Whole or 2% milk, cream, and half-and-half. Whole or full-fat cream cheese. Whole-fat or sweetened yogurt. Full-fat cheese. Nondairy creamers.  Whipped toppings. Processed cheese and cheese spreads. Fats and oils  Butter. Stick margarine. Lard. Shortening. Ghee. Bacon fat. Tropical oils, such as coconut, palm kernel, or palm oil. Seasoning and other foods  Salted popcorn and pretzels. Onion salt, garlic salt, seasoned salt, table salt, and sea salt. Worcestershire sauce. Tartar sauce. Barbecue sauce. Teriyaki sauce. Soy sauce, including reduced-sodium. Steak sauce. Canned and packaged gravies. Fish sauce. Oyster sauce. Cocktail sauce. Horseradish that you find on the shelf. Ketchup. Mustard. Meat flavorings and tenderizers. Bouillon cubes. Hot sauce and Tabasco sauce. Premade or packaged marinades. Premade or packaged taco seasonings. Relishes. Regular salad dressings. Where to find more information:  National Heart, Lung, and Blood Institute: www.nhlbi.nih.gov  American Heart Association: www.heart.org Summary  The DASH eating plan is a healthy eating plan that has been shown to reduce high blood pressure (hypertension). It may also reduce your risk for type 2 diabetes, heart disease, and stroke.  With the DASH eating plan, you should limit salt (sodium) intake to 2,300 mg a day. If you have hypertension, you may need to reduce your sodium intake to 1,500 mg a day.  When on the DASH eating plan, aim to eat more fresh fruits and vegetables, whole grains, lean proteins, low-fat dairy, and heart-healthy fats.  Work   with your health care provider or diet and nutrition specialist (dietitian) to adjust your eating plan to your individual calorie needs. This information is not intended to replace advice given to you by your health care provider. Make sure you discuss any questions you have with your health care provider. Document Released: 06/20/2011 Document Revised: 06/24/2016 Document Reviewed: 06/24/2016 Elsevier Interactive Patient Education  2017 Else[employer] Inc. Hypertension Hypertension, commonly called high blood pressure, is when  the force of blood pumping through the arteries is too strong. The arteries are the blood vessels that carry blood from the heart throughout the body. Hypertension forces the heart to work harder to pump blood and may cause arteries to become narrow or stiff. Having untreated or uncontrolled hypertension can cause heart attacks, strokes, kidney disease, and other problems. A blood pressure reading consists of a higher number over a lower number. Ideally, your blood pressure should be below 120/80. The first ("top") number is called the systolic pressure. It is a measure of the pressure in your arteries as your heart beats. The second ("bottom") number is called the diastolic pressure. It is a measure of the pressure in your arteries as the heart relaxes. What are the causes? The cause of this condition is not known. What increases the risk? Some risk factors for high blood pressure are under your control. Others are not. Factors you can change   Smoking.  Having type 2 diabetes mellitus, high cholesterol, or both.  Not getting enough exercise or physical activity.  Being overweight.  Having too much fat, sugar, calories, or salt (sodium) in your diet.  Drinking too much alcohol. Factors that are difficult or impossible to change   Having chronic kidney disease.  Having a family history of high blood pressure.  Age. Risk increases with age.  Race. You may be at higher risk if you are African-American.  Gender. Men are at higher risk than women before age 45. After age 65, women are at higher risk than men.  Having obstructive sleep apnea.  Stress. What are the signs or symptoms? Extremely high blood pressure (hypertensive crisis) may cause:  Headache.  Anxiety.  Shortness of breath.  Nosebleed.  Nausea and vomiting.  Severe chest pain.  Jerky movements you cannot control (seizures). How is this diagnosed? This condition is diagnosed by measuring your blood pressure  while you are seated, with your arm resting on a surface. The cuff of the blood pressure monitor will be placed directly against the skin of your upper arm at the level of your heart. It should be measured at least twice using the same arm. Certain conditions can cause a difference in blood pressure between your right and left arms. Certain factors can cause blood pressure readings to be lower or higher than normal (elevated) for a short period of time:  When your blood pressure is higher when you are in a health care provider's office than when you are at home, this is called white coat hypertension. Most people with this condition do not need medicines.  When your blood pressure is higher at home than when you are in a health care provider's office, this is called masked hypertension. Most people with this condition may need medicines to control blood pressure. If you have a high blood pressure reading during one visit or you have normal blood pressure with other risk factors:  You may be asked to return on a different day to have your blood pressure checked again.  You may   be asked to monitor your blood pressure at home for 1 week or longer. If you are diagnosed with hypertension, you may have other blood or imaging tests to help your health care provider understand your overall risk for other conditions. How is this treated? This condition is treated by making healthy lifestyle changes, such as eating healthy foods, exercising more, and reducing your alcohol intake. Your health care provider may prescribe medicine if lifestyle changes are not enough to get your blood pressure under control, and if:  Your systolic blood pressure is above 130.  Your diastolic blood pressure is above 80. Your personal target blood pressure may vary depending on your medical conditions, your age, and other factors. Follow these instructions at home: Eating and drinking   Eat a diet that is high in fiber and  potassium, and low in sodium, added sugar, and fat. An example eating plan is called the DASH (Dietary Approaches to Stop Hypertension) diet. To eat this way:  Eat plenty of fresh fruits and vegetables. Try to fill half of your plate at each meal with fruits and vegetables.  Eat whole grains, such as whole wheat pasta, brown rice, or whole grain bread. Fill about one quarter of your plate with whole grains.  Eat or drink low-fat dairy products, such as skim milk or low-fat yogurt.  Avoid fatty cuts of meat, processed or cured meats, and poultry with skin. Fill about one quarter of your plate with lean proteins, such as fish, chicken without skin, beans, eggs, and tofu.  Avoid premade and processed foods. These tend to be higher in sodium, added sugar, and fat.  Reduce your daily sodium intake. Most people with hypertension should eat less than 1,500 mg of sodium a day.  Limit alcohol intake to no more than 1 drink a day for nonpregnant women and 2 drinks a day for men. One drink equals 12 oz of beer, 5 oz of wine, or 1 oz of hard liquor. Lifestyle   Work with your health care provider to maintain a healthy body weight or to lose weight. Ask what an ideal weight is for you.  Get at least 30 minutes of exercise that causes your heart to beat faster (aerobic exercise) most days of the week. Activities may include walking, swimming, or biking.  Include exercise to strengthen your muscles (resistance exercise), such as pilates or lifting weights, as part of your weekly exercise routine. Try to do these types of exercises for 30 minutes at least 3 days a week.  Do not use any products that contain nicotine or tobacco, such as cigarettes and e-cigarettes. If you need help quitting, ask your health care provider.  Monitor your blood pressure at home as told by your health care provider.  Keep all follow-up visits as told by your health care provider. This is important. Medicines   Take  over-the-counter and prescription medicines only as told by your health care provider. Follow directions carefully. Blood pressure medicines must be taken as prescribed.  Do not skip doses of blood pressure medicine. Doing this puts you at risk for problems and can make the medicine less effective.  Ask your health care provider about side effects or reactions to medicines that you should watch for. Contact a health care provider if:  You think you are having a reaction to a medicine you are taking.  You have headaches that keep coming back (recurring).  You feel dizzy.  You have swelling in your ankles.  You   have trouble with your vision. Get help right away if:  You develop a severe headache or confusion.  You have unusual weakness or numbness.  You feel faint.  You have severe pain in your chest or abdomen.  You vomit repeatedly.  You have trouble breathing. Summary  Hypertension is when the force of blood pumping through your arteries is too strong. If this condition is not controlled, it may put you at risk for serious complications.  Your personal target blood pressure may vary depending on your medical conditions, your age, and other factors. For most people, a normal blood pressure is less than 120/80.  Hypertension is treated with lifestyle changes, medicines, or a combination of both. Lifestyle changes include weight loss, eating a healthy, low-sodium diet, exercising more, and limiting alcohol. This information is not intended to replace advice given to you by your health care provider. Make sure you discuss any questions you have with your health care provider. Document Released: 07/01/2005 Document Revised: 05/29/2016 Document Reviewed: 05/29/2016 Elsevier Interactive Patient Education  2017 Elsevier Inc.  

## 2016-10-02 NOTE — Progress Notes (Signed)
Subjective:    Patient ID: Jeremy [name], male    DOB: 15-Jun-1981, 36 y.o.   MRN: [id number]  HPI Patient is a 36 year old patient with a medical history significant for HTN and Heart failure (EF 35-40% in 2015), s/p cardiac cath in 2015, was last seen in this office 2 years ago, here today to reestablish care. He reports not being able to keep his follow up appointments due to his work schedule, reports running out of his blood pressure medications a week ago, he does not take his medication as prescribes, often skips doses. He denies chest pain or SOB but reports occasional headaches- which he says goes away with rest. He smokes 4-5 sticks of cigarette daily, drinks alcohol occasionally 2-3 times a month. Denies recreational drug use. He reports feeling fine, has no complaint today.  Past Medical History:  Diagnosis Date  . Back pain   . Hx of cardiac catheterization    a. 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 Hypertension   . Hypertensive heart disease without congestive heart failure   . NICM (nonischemic cardiomyopathy) (HCC)    a. EF 35 to 40% by LHC on 05/15/14 likely due to hypertensive CM   History reviewed. No pertinent surgical history. Current Outpatient Prescriptions on File Prior to Visit  Medication Sig Dispense Refill  . acetaminophen (TYLENOL) 500 MG tablet Take 1,000 mg by mouth 3 (three) times daily as needed (pain).    Marland Kitchen amLODipine (NORVASC) 10 MG tablet Take 1 tablet (10 mg total) by mouth daily. 90 tablet 3  . carbamide peroxide (EAR WAX REMOVAL DROPS) 6.5 % otic solution Place 5 drops into both ears 2 (two) times daily. 15 mL 0  . carvedilol (COREG) 12.5 MG tablet Take 1 tablet (12.5 mg total) by mouth 2 (two) times daily with a meal. 180 tablet 3  . lisinopril (PRINIVIL,ZESTRIL) 20 MG tablet Take 1 tablet (20 mg total) by mouth daily. 90 tablet 3   No  current facility-administered medications on file prior to visit.    Allergies  Allergen Reactions  . Ampicillin Hives  . Penicillins Hives    Review of Systems  Constitutional: Negative for chills, fatigue, fever and unexpected weight change.  HENT: Negative.   Eyes: Negative for photophobia and visual disturbance.  Respiratory: Negative for chest tightness and shortness of breath.   Cardiovascular: Negative for chest pain, palpitations and leg swelling.  Gastrointestinal: Negative for abdominal pain, anal bleeding and blood in stool.  Endocrine: Negative for polydipsia, polyphagia and polyuria.  Genitourinary: Negative.   Musculoskeletal: Negative.       Objective: BP 135/86 (BP Location: Left Arm, Patient Position: Sitting, Cuff Size: Large)   Pulse 98   Temp 98.7 F (37.1 C) (Oral)   Resp 18   Ht 6' (1.829 m)   Wt 269 lb (122 kg)   SpO2 97%   BMI 36.48 kg/m   Depression screen [facility] 2/9 10/02/2016 04/27/2015  Decreased Interest 0 0  Down, Depressed, Hopeless 0 0  PHQ - 2 Score 0 0       Physical Exam  Constitutional: He is oriented to person, place, and time. He appears well-nourished. No distress.  Look older than stated age  HENT:  Head: Normocephalic and atraumatic.  Right Ear: External ear normal.  Left Ear: External ear normal.  Mouth/Throat: Oropharynx is clear and moist.  Eyes:  Conjunctivae and EOM are normal. Pupils are equal, round, and reactive to light.  Neck: Normal range of motion. Neck supple. No thyromegaly present.  Cardiovascular: Normal rate, regular rhythm and normal heart sounds.   No murmur heard. Pulmonary/Chest: Effort normal and breath sounds normal. No respiratory distress.  Abdominal: Soft. Bowel sounds are normal. There is no tenderness.  Musculoskeletal: Normal range of motion. He exhibits no edema or tenderness.  Lymphadenopathy:    He has no cervical adenopathy.  Neurological: He is alert and oriented to person, place, and time.    Skin: Skin is warm and dry.   generalize acne craters on face, no redness  Psychiatric: He has a normal mood and affect. His behavior is normal.      Assessment & Plan:  1. Hypertensive heart disease without heart failure Stable despite not complaint with meds and being out of his BP meds for a week, will discontinue Amlodipine and reevaluate blood pressure at next visit. Will order an ECHO and refer to cardiology if indicated. - carvedilol (COREG) 12.5 MG tablet; Take 1 tablet (12.5 mg total) by mouth 2 (two) times daily with a meal.  Dispense: 180 tablet; Refill: 3 - lisinopril (PRINIVIL,ZESTRIL) 20 MG tablet; Take 1 tablet (20 mg total) by mouth daily.  Dispense: 90 tablet; Refill: 3 - COMPLETE METABOLIC PANEL WITH GFR - Lipid panel - TSH - CBC with Differential/Platelet - ECHOCARDIOGRAM COMPLETE; Future  2. Tobacco cessation  [name] was counseled on the dangers of tobacco use, and was advised to quit. Reviewed strategies to maximize success, including removing cigarettes and smoking materials from environment, stress management and support of family/friends.  Labs pending Health maintenance reviewed Diet and exercise encouraged Continue all meds Follow up  in 6 months   Jeremy Boom, RN, BSN, AGNP-Student  Evaluation and management procedures were performed by me with DNP Student in attendance, note written by DNP student under my supervision and collaboration. I have reviewed the note and I agree with the management and plan.   Jeremy Lewandowsky, MD, MHA, CPE, FACP, FAAP [facility] [place], [place] [phone]   10/05/2016, 5:17 PM

## 2016-10-10 ENCOUNTER — Telehealth: Payer: Self-pay | Admitting: *Deleted

## 2016-10-10 NOTE — Telephone Encounter (Signed)
Please inform patient of labs being normal. Medical Assistant left message on patient's home and cell voicemail. Voicemail states to give a call back to [place] with [facility] at [phone].

## 2016-10-10 NOTE — Telephone Encounter (Signed)
-----   Message from Quentin Angst, MD sent at 10/08/2016 11:03 AM EDT ----- Normal Labs

## 2017-11-14 ENCOUNTER — Ambulatory Visit: Payer: Self-pay | Admitting: Family Medicine

## 2018-04-15 ENCOUNTER — Ambulatory Visit: Payer: Self-pay | Admitting: Family Medicine

## 2018-07-02 ENCOUNTER — Ambulatory Visit: Payer: Self-pay | Attending: Family Medicine | Admitting: Family Medicine

## 2018-07-02 ENCOUNTER — Encounter: Payer: Self-pay | Admitting: Family Medicine

## 2018-07-02 VITALS — BP 129/87 | HR 83 | Temp 97.9°F | Ht 72.0 in | Wt 272.6 lb

## 2018-07-02 DIAGNOSIS — Z880 Allergy status to penicillin: Secondary | ICD-10-CM | POA: Insufficient documentation

## 2018-07-02 DIAGNOSIS — I110 Hypertensive heart disease with heart failure: Secondary | ICD-10-CM | POA: Insufficient documentation

## 2018-07-02 DIAGNOSIS — I5042 Chronic combined systolic (congestive) and diastolic (congestive) heart failure: Secondary | ICD-10-CM | POA: Insufficient documentation

## 2018-07-02 DIAGNOSIS — Z114 Encounter for screening for human immunodeficiency virus [HIV]: Secondary | ICD-10-CM | POA: Insufficient documentation

## 2018-07-02 DIAGNOSIS — Z79899 Other long term (current) drug therapy: Secondary | ICD-10-CM | POA: Insufficient documentation

## 2018-07-02 DIAGNOSIS — Z720 Tobacco use: Secondary | ICD-10-CM

## 2018-07-02 MED ORDER — BUPROPION HCL ER (SR) 150 MG PO TB12: 150.0000 mg | ORAL_TABLET | Freq: Two times a day (BID) | ORAL | 3 refills | Status: DC

## 2018-07-02 MED ORDER — CARVEDILOL 12.5 MG PO TABS: 12.5000 mg | ORAL_TABLET | Freq: Two times a day (BID) | ORAL | 1 refills | Status: DC

## 2018-07-02 MED ORDER — AMLODIPINE BESYLATE 5 MG PO TABS: 5.0000 mg | ORAL_TABLET | Freq: Every day | ORAL | 1 refills | Status: DC

## 2018-07-02 MED ORDER — LISINOPRIL 20 MG PO TABS: 20.0000 mg | ORAL_TABLET | Freq: Every day | ORAL | 1 refills | Status: DC

## 2018-07-02 NOTE — Progress Notes (Signed)
Subjective:  Patient ID: Jeremy [name], male    DOB: 1980-09-17  Age: 37 y.o. MRN: [id number]  CC: Hypertension   HPI Jeremy [name] is a 37 year old male with a history of congestive heart failure with reduced ejection fraction (EF 35 to 40% from echo 05/2014), hypertension, tobacco abuse who was last seen in the clinic in 09/2016 and presents today to establish care with me. During this timeframe he ran out of his medications and has been taking his mother's medications. His chart reveals he should be on carvedilol and lisinopril however he informs me he has been taking metoprolol, lisinopril and amlodipine from his mother.  He states his blood pressure is uncontrolled on just 2 antihypertensives. Denies shortness of breath, chest pain, weight gain, pedal edema. He smokes 2 packs of cigarettes per week and is interested in quitting. Declines flu shot today and has no acute concerns today.  Past Medical History:  Diagnosis Date  . Back pain   . Hx of cardiac catheterization    a. 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 Hypertension   . Hypertensive heart disease without congestive heart failure   . NICM (nonischemic cardiomyopathy) ([place])    a. EF 35 to 40% by LHC on 05/15/14 likely due to hypertensive CM    History reviewed. No pertinent surgical history.  Allergies  Allergen Reactions  . Ampicillin Hives  . Penicillins Hives     Outpatient Medications Prior to Visit  Medication Sig Dispense Refill  . carvedilol (COREG) 12.5 MG tablet Take 1 tablet (12.5 mg total) by mouth 2 (two) times daily with a meal. 180 tablet 3  . lisinopril (PRINIVIL,ZESTRIL) 20 MG tablet Take 1 tablet (20 mg total) by mouth daily. 90 tablet 3  . metoprolol tartrate (LOPRESSOR) 25 MG tablet Take 25 mg by mouth daily.     No facility-administered medications prior to visit.      ROS Review of Systems  Constitutional: Negative for activity change and appetite change.  HENT: Negative for sinus pressure and sore throat.   Eyes: Negative for visual disturbance.  Respiratory: Negative for cough, chest tightness and shortness of breath.   Cardiovascular: Negative for chest pain and leg swelling.  Gastrointestinal: Negative for abdominal distention, abdominal pain, constipation and diarrhea.  Endocrine: Negative.   Genitourinary: Negative for dysuria.  Musculoskeletal: Negative for joint swelling and myalgias.  Skin: Negative for rash.  Allergic/Immunologic: Negative.   Neurological: Negative for weakness, light-headedness and numbness.  Psychiatric/Behavioral: Negative for dysphoric mood and suicidal ideas.    Objective:  BP 129/87   Pulse 83   Temp 97.9 F (36.6 C) (Oral)   Ht 6' (1.829 m)   Wt 272 lb 9.6 oz (123.7 kg)   SpO2 99%   BMI 36.97 kg/m   BP/Weight 07/02/2018 10/02/2016 37/04/6268  Systolic BP 485 462 703  Diastolic BP 87 86 88  Wt. (Lbs) 272.6 269 251.1  BMI 36.97 36.48 33.14      Physical Exam Constitutional:      Appearance: He is well-developed.  Cardiovascular:     Rate and Rhythm: Normal rate.     Heart sounds: Normal heart sounds. No murmur.  Pulmonary:     Effort: Pulmonary effort is normal.     Breath sounds: Normal breath sounds. No wheezing or rales.  Chest:     Chest wall: No tenderness.  Abdominal:  General: Bowel sounds are normal. There is no distension.     Palpations: Abdomen is soft. There is no mass.     Tenderness: There is no abdominal tenderness.  Musculoskeletal: Normal range of motion.  Neurological:     Mental Status: He is alert and oriented to person, place, and time.  Psychiatric:        Mood and Affect: Mood normal.        Behavior: Behavior normal.      CMP Latest Ref Rng & Units 10/02/2016 04/27/2015 05/24/2014  Glucose 65 - 99 mg/dL 93 85 87  BUN 7 - 25 mg/dL _0 Creatinine  0.60 - 1.35 mg/dL 0.94 0.98 1.0  Sodium 135 - 146 mmol/L 139 138 137  Potassium 3.5 - 5.3 mmol/L 4.2 4.3 4.3  Chloride 98 - 110 mmol/L 103 104 105  CO2 20 - 31 mmol/L _1 Calcium 8.6 - 10.3 mg/dL 9.3 8.8 8.6  Total Protein 6.1 - 8.1 g/dL 8.2(H) 7.8 -  Total Bilirubin 0.2 - 1.2 mg/dL 0.3 0.5 -  Alkaline Phos 40 - 115 U/L 73 69 -  AST 10 - 40 U/L 21 19 -  ALT 9 - 46 U/L 22 14 -    Lipid Panel     Component Value Date/Time   CHOL 163 10/02/2016 1159   TRIG 104 10/02/2016 1159   HDL 35 (L) 10/02/2016 1159   CHOLHDL 4.7 10/02/2016 1159   VLDL 21 10/02/2016 1159   LDLCALC 107 (H) 10/02/2016 1159    CMP Latest Ref Rng & Units 10/02/2016 04/27/2015 05/24/2014  Glucose 65 - 99 mg/dL 93 85 87  BUN 7 - 25 mg/dL _2 Creatinine 0.60 - 1.35 mg/dL 0.94 0.98 1.0  Sodium 135 - 146 mmol/L 139 138 137  Potassium 3.5 - 5.3 mmol/L 4.2 4.3 4.3  Chloride 98 - 110 mmol/L 103 104 105  CO2 20 - 31 mmol/L _3 Calcium 8.6 - 10.3 mg/dL 9.3 8.8 8.6  Total Protein 6.1 - 8.1 g/dL 8.2(H) 7.8 -  Total Bilirubin 0.2 - 1.2 mg/dL 0.3 0.5 -  Alkaline Phos 40 - 115 U/L 73 69 -  AST 10 - 40 U/L 21 19 -  ALT 9 - 46 U/L 22 14 -     Assessment & Plan:   1. Hypertensive heart disease with chronic combined systolic and diastolic congestive heart failure (HCC) EF 35 to 40% from echo of 05/2014 Euvolemic Referred for repeat echocardiogram on different occasions which he has not been compliant with He has no medical coverage and has been advised to apply for the [place] financial discount to facilitate this process Continue medications, fluid restriction, daily weights - carvedilol (COREG) 12.5 MG tablet; Take 1 tablet (12.5 mg total) by mouth 2 (two) times daily with a meal.  Dispense: 180 tablet; Refill: 1 - lisinopril (PRINIVIL,ZESTRIL) 20 MG tablet; Take 1 tablet (20 mg total) by mouth daily.  Dispense: 90 tablet; Refill: 1 - amLODipine (NORVASC) 5 MG tablet; Take 1 tablet (5 mg total) by  mouth daily.  Dispense: 90 tablet; Refill: 1 - CMP14+EGFR - Lipid panel   2. Screening for HIV (human immunodeficiency virus) - HIV Antibody (routine testing w rflx)  3. Tobacco abuse Spent 3 minutes counseling on cessation and he is willing to work on quitting - buPROPion (WELLBUTRIN SR) 150 MG 12 hr tablet; Take 1 tablet (150 mg total) by mouth 2 (two) times daily.  Dispense: 60 tablet; Refill: 3   Meds ordered this encounter  Medications  . carvedilol (COREG) 12.5 MG tablet    Sig: Take 1 tablet (12.5 mg total) by mouth 2 (two) times daily with a meal.    Dispense:  180 tablet    Refill:  1  . lisinopril (PRINIVIL,ZESTRIL) 20 MG tablet    Sig: Take 1 tablet (20 mg total) by mouth daily.    Dispense:  90 tablet    Refill:  1  . amLODipine (NORVASC) 5 MG tablet    Sig: Take 1 tablet (5 mg total) by mouth daily.    Dispense:  90 tablet    Refill:  1  . buPROPion (WELLBUTRIN SR) 150 MG 12 hr tablet    Sig: Take 1 tablet (150 mg total) by mouth 2 (two) times daily.    Dispense:  60 tablet    Refill:  3    Follow-up: Return in about 6 months (around 01/01/2019) for Follow-up of chronic medical conditions.   Charlott Rakes MD

## 2018-07-02 NOTE — Patient Instructions (Signed)
Steps to Quit Smoking    Smoking tobacco can be bad for your health. It can also affect almost every organ in your body. Smoking puts you and people around you at risk for many serious long-lasting (chronic) diseases. Quitting smoking is hard, but it is one of the best things that you can do for your health. It is never too late to quit.  What are the benefits of quitting smoking?  When you quit smoking, you lower your risk for getting serious diseases and conditions. They can include:  · Lung cancer or lung disease.  · Heart disease.  · Stroke.  · Heart attack.  · Not being able to have children (infertility).  · Weak bones (osteoporosis) and broken bones (fractures).  If you have coughing, wheezing, and shortness of breath, those symptoms may get better when you quit. You may also get sick less often. If you are pregnant, quitting smoking can help to lower your chances of having a baby of low birth weight.  What can I do to help me quit smoking?  Talk with your doctor about what can help you quit smoking. Some things you can do (strategies) include:  · Quitting smoking totally, instead of slowly cutting back how much you smoke over a period of time.  · Going to in-person counseling. You are more likely to quit if you go to many counseling sessions.  · Using resources and support systems, such as:  ? Online chats with a counselor.  ? Phone quitlines.  ? Printed self-help materials.  ? Support groups or group counseling.  ? Text messaging programs.  ? Mobile phone apps or applications.  · Taking medicines. Some of these medicines may have nicotine in them. If you are pregnant or breastfeeding, do not take any medicines to quit smoking unless your doctor says it is okay. Talk with your doctor about counseling or other things that can help you.  Talk with your doctor about using more than one strategy at the same time, such as taking medicines while you are also going to in-person counseling. This can help make  quitting easier.  What things can I do to make it easier to quit?  Quitting smoking might feel very hard at first, but there is a lot that you can do to make it easier. Take these steps:  · Talk to your family and friends. Ask them to support and encourage you.  · Call phone quitlines, reach out to support groups, or work with a counselor.  · Ask people who smoke to not smoke around you.  · Avoid places that make you want (trigger) to smoke, such as:  ? Bars.  ? Parties.  ? Smoke-break areas at work.  · Spend time with people who do not smoke.  · Lower the stress in your life. Stress can make you want to smoke. Try these things to help your stress:  ? Getting regular exercise.  ? Deep-breathing exercises.  ? Yoga.  ? Meditating.  ? Doing a body scan. To do this, close your eyes, focus on one area of your body at a time from head to toe, and notice which parts of your body are tense. Try to relax the muscles in those areas.  · Download or buy apps on your mobile phone or tablet that can help you stick to your quit plan. There are many free apps, such as QuitGuide from the CDC (Centers for Disease Control and Prevention). You can find more   support from smokefree.gov and other websites.  This information is not intended to replace advice given to you by your health care provider. Make sure you discuss any questions you have with your health care provider.  Document Released: 04/27/2009 Document Revised: 02/27/2016 Document Reviewed: 11/15/2014  Elsevier Interactive Patient Education © 2019 Elsevier Inc.

## 2018-07-03 LAB — HIV ANTIBODY (ROUTINE TESTING W REFLEX): HIV Screen 4th Generation wRfx: NONREACTIVE

## 2018-07-03 LAB — CMP14+EGFR
ALK PHOS: 78 IU/L (ref 39–117)
ALT: 10 IU/L (ref 0–44)
AST: 16 IU/L (ref 0–40)
Albumin/Globulin Ratio: 1.2 (ref 1.2–2.2)
Albumin: 4.6 g/dL (ref 3.5–5.5)
BUN/Creatinine Ratio: 10 (ref 9–20)
BUN: 10 mg/dL (ref 6–20)
Bilirubin Total: 0.3 mg/dL (ref 0.0–1.2)
CO2: 22 mmol/L (ref 20–29)
Calcium: 9.7 mg/dL (ref 8.7–10.2)
Chloride: 99 mmol/L (ref 96–106)
Creatinine, Ser: 1.03 mg/dL (ref 0.76–1.27)
GFR calc Af Amer: 105 mL/min/{1.73_m2} (ref >59)
GFR calc non Af Amer: 91 mL/min/{1.73_m2} (ref >59)
GLOBULIN, TOTAL: 3.7 g/dL (ref 1.5–4.5)
Glucose: 89 mg/dL (ref 65–99)
POTASSIUM: 4 mmol/L (ref 3.5–5.2)
SODIUM: 138 mmol/L (ref 134–144)
Total Protein: 8.3 g/dL (ref 6.0–8.5)

## 2018-07-03 LAB — LIPID PANEL
CHOL/HDL RATIO: 4.4 ratio (ref 0.0–5.0)
Cholesterol, Total: 175 mg/dL (ref 100–199)
HDL: 40 mg/dL (ref >39)
LDL Calculated: 112 mg/dL — ABNORMAL HIGH (ref 0–99)
TRIGLYCERIDES: 115 mg/dL (ref 0–149)
VLDL Cholesterol Cal: 23 mg/dL (ref 5–40)

## 2018-07-06 ENCOUNTER — Telehealth: Payer: Self-pay

## 2018-07-06 NOTE — Telephone Encounter (Signed)
-----   Message from Hoy Register, MD sent at 07/03/2018  1:39 PM EST ----- Kidney and liver functions are normal.  LDL (bad LDL) is slightly elevated.  Please encourage compliance with a low-cholesterol diet and if it remains elevated with his next set of labs, I will commence a cholesterol medication.

## 2018-07-06 NOTE — Telephone Encounter (Signed)
Patient was called and informed of lab results. 

## 2019-02-03 ENCOUNTER — Ambulatory Visit: Payer: Self-pay | Attending: Family Medicine | Admitting: Family Medicine

## 2019-02-03 ENCOUNTER — Other Ambulatory Visit: Payer: Self-pay

## 2019-02-03 DIAGNOSIS — I5042 Chronic combined systolic (congestive) and diastolic (congestive) heart failure: Secondary | ICD-10-CM

## 2019-02-03 DIAGNOSIS — I110 Hypertensive heart disease with heart failure: Secondary | ICD-10-CM

## 2019-02-03 DIAGNOSIS — M546 Pain in thoracic spine: Secondary | ICD-10-CM

## 2019-02-03 MED ORDER — CARVEDILOL 12.5 MG PO TABS: 12.5000 mg | ORAL_TABLET | Freq: Two times a day (BID) | ORAL | 1 refills | Status: DC

## 2019-02-03 MED ORDER — LISINOPRIL 20 MG PO TABS: 20.0000 mg | ORAL_TABLET | Freq: Every day | ORAL | 1 refills | Status: DC

## 2019-02-03 MED ORDER — AMLODIPINE BESYLATE 5 MG PO TABS: 5.0000 mg | ORAL_TABLET | Freq: Every day | ORAL | 1 refills | Status: DC

## 2019-02-03 MED ORDER — METHOCARBAMOL 500 MG PO TABS: 1000.0000 mg | ORAL_TABLET | Freq: Two times a day (BID) | ORAL | 1 refills | Status: DC | PRN

## 2019-02-03 MED ORDER — TRAMADOL HCL 50 MG PO TABS: 50.0000 mg | ORAL_TABLET | Freq: Two times a day (BID) | ORAL | 0 refills | Status: DC | PRN

## 2019-02-03 NOTE — Progress Notes (Signed)
Patient has been called and DOB has been verified. Patient has been screened and transferred to PCP to start phone visit.  Patient is having pain in his back around his shoulder blades.

## 2019-02-03 NOTE — Progress Notes (Signed)
Virtual Visit via Telephone Note  I connected with Jeremy [name], on 02/03/2019 at 9:46 AM by telephone due to the COVID-19 pandemic and verified that I am speaking with the correct person using two identifiers.   Consent: I discussed the limitations, risks, security and privacy concerns of performing an evaluation and management service by telephone and the availability of in person appointments. I also discussed with the patient that there may be a patient responsible charge related to this service. The patient expressed understanding and agreed to proceed.   Location of Patient: Home  Location of Provider: Clinic   Persons participating in Telemedicine visit: Draper Gallon Farrington-CMA Dr. Felecia Shelling     History of Present Illness: Jeremy [name] is a 38 year old male with a history of congestive heart failure with reduced ejection fraction (EF 35 to 40% from echo 05/2014), hypertension, tobacco abuse seen today for an acute visit. He complains of upper back, 10/10 radiating around his shoulder blades to his chest wall anteriorly and he had called EMS due to its severity. He has been using 800 mg of ibuprofen twice daily which relieves the pain but not the spasms. He has had similar pain in the past as he previously worked with a [employer], broadcasting/film/video.  Denies numbness, tingling or decreasing hand strength or grip. Pain is increased with movement and deep breaths, decreased with sitting.  Today he requests a refill of his chronic medications.   Past Medical History:  Diagnosis Date  . Back pain   . Hx of cardiac catheterization    a. 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 Hypertension   . Hypertensive heart disease without congestive heart failure   . NICM (nonischemic cardiomyopathy) ([place])    a. EF 35 to 40% by LHC on 05/15/14 likely due to  hypertensive CM   Allergies  Allergen Reactions  . Ampicillin Hives  . Penicillins Hives    Current Outpatient Medications on File Prior to Visit  Medication Sig Dispense Refill  . amLODipine (NORVASC) 5 MG tablet Take 1 tablet (5 mg total) by mouth daily. 90 tablet 1  . buPROPion (WELLBUTRIN SR) 150 MG 12 hr tablet Take 1 tablet (150 mg total) by mouth 2 (two) times daily. 60 tablet 3  . carvedilol (COREG) 12.5 MG tablet Take 1 tablet (12.5 mg total) by mouth 2 (two) times daily with a meal. 180 tablet 1  . lisinopril (PRINIVIL,ZESTRIL) 20 MG tablet Take 1 tablet (20 mg total) by mouth daily. 90 tablet 1   No current facility-administered medications on file prior to visit.     Observations/Objective: Awake, alert, oriented x3 In acute pain  Assessment and Plan: 1. Acute midline thoracic back pain Unknown etiology Advised to apply heat If symptoms worsen, consider imaging vs Ortho - traMADol (ULTRAM) 50 MG tablet; Take 1 tablet (50 mg total) by mouth every 12 (twelve) hours as needed.  Dispense: 30 tablet; Refill: 0 - methocarbamol (ROBAXIN) 500 MG tablet; Take 2 tablets (1,000 mg total) by mouth 2 (two) times daily as needed for muscle spasms.  Dispense: 60 tablet; Refill: 1  2. Hypertensive heart disease with chronic combined systolic and diastolic congestive heart failure (HCC) EF 35-40%, Euvolemic Due for an echo - will order at next visit - lisinopril (ZESTRIL) 20 MG tablet; Take 1 tablet (20 mg total) by mouth daily.  Dispense: 90 tablet; Refill: 1 -  carvedilol (COREG) 12.5 MG tablet; Take 1 tablet (12.5 mg total) by mouth 2 (two) times daily with a meal.  Dispense: 180 tablet; Refill: 1 - amLODipine (NORVASC) 5 MG tablet; Take 1 tablet (5 mg total) by mouth daily.  Dispense: 90 tablet; Refill: 1 - CMP14+EGFR; Future - Lipid panel; Future   Follow Up Instructions: Return if symptoms worsen or fail to improve.    I discussed the assessment and treatment plan with the  patient. The patient was provided an opportunity to ask questions and all were answered. The patient agreed with the plan and demonstrated an understanding of the instructions.   The patient was advised to call back or seek an in-person evaluation if the symptoms worsen or if the condition fails to improve as anticipated.     I provided 12 minutes total of non-face-to-face time during this encounter including median intraservice time, reviewing previous notes, labs, imaging, medications, management and patient verbalized understanding.     Charlott Rakes, MD, FAAFP. [facility] [place], [place]   02/03/2019, 9:46 AM

## 2019-02-17 ENCOUNTER — Other Ambulatory Visit: Payer: Self-pay

## 2019-07-23 ENCOUNTER — Other Ambulatory Visit: Payer: Self-pay | Admitting: Family Medicine

## 2019-07-23 DIAGNOSIS — I110 Hypertensive heart disease with heart failure: Secondary | ICD-10-CM

## 2019-09-13 ENCOUNTER — Telehealth: Payer: Self-pay | Admitting: Internal Medicine

## 2019-09-13 NOTE — Telephone Encounter (Signed)
Patient has an appointment with a provider to get refills. Patient has not been seen in over 6 months.

## 2019-09-13 NOTE — Telephone Encounter (Signed)
NEED REFILL FOR THE FOLLOWING MEDICATIONS: Lisinopril (Tab)  Carvedilol (Tab) amLODIPine Besylate (Tab)  HAS AN UP COMING APPT WITH AMY ON Thursday BUT NEEDS MEDICATION TO HOLD HIM OVER UNTIL THEN

## 2019-09-16 ENCOUNTER — Other Ambulatory Visit: Payer: Self-pay

## 2019-09-16 ENCOUNTER — Encounter: Payer: Self-pay | Admitting: Family

## 2019-09-16 ENCOUNTER — Ambulatory Visit: Payer: Self-pay | Attending: Family | Admitting: Family

## 2019-09-16 DIAGNOSIS — I5042 Chronic combined systolic (congestive) and diastolic (congestive) heart failure: Secondary | ICD-10-CM

## 2019-09-16 DIAGNOSIS — I110 Hypertensive heart disease with heart failure: Secondary | ICD-10-CM

## 2019-09-16 MED ORDER — AMLODIPINE BESYLATE 5 MG PO TABS: 5.0000 mg | ORAL_TABLET | Freq: Every day | ORAL | 2 refills | Status: DC

## 2019-09-16 MED ORDER — CARVEDILOL 12.5 MG PO TABS: 12.5000 mg | ORAL_TABLET | Freq: Two times a day (BID) | ORAL | 2 refills | Status: DC

## 2019-09-16 MED ORDER — LISINOPRIL 20 MG PO TABS: 20.0000 mg | ORAL_TABLET | Freq: Every day | ORAL | 2 refills | Status: DC

## 2019-09-16 NOTE — Patient Instructions (Addendum)
Continue blood pressure and heart failure medications. Follow-up in 2 weeks with clinical pharmacist for blood pressure check. Follow-up in 3 months with attending physician for management of chronic conditions. Apply for [facility] discount/orange card and appointment with financial counselor. Hypertension, Adult Hypertension is another name for high blood pressure. High blood pressure forces your heart to work harder to pump blood. This can cause problems over time. There are two numbers in a blood pressure reading. There is a top number (systolic) over a bottom number (diastolic). It is best to have a blood pressure that is below 120/80. Healthy choices can help lower your blood pressure, or you may need medicine to help lower it. What are the causes? The cause of this condition is not known. Some conditions may be related to high blood pressure. What increases the risk?  Smoking.  Having type 2 diabetes mellitus, high cholesterol, or both.  Not getting enough exercise or physical activity.  Being overweight.  Having too much fat, sugar, calories, or salt (sodium) in your diet.  Drinking too much alcohol.  Having long-term (chronic) kidney disease.  Having a family history of high blood pressure.  Age. Risk increases with age.  Race. You may be at higher risk if you are African American.  Gender. Men are at higher risk than women before age 47. After age 61, women are at higher risk than men.  Having obstructive sleep apnea.  Stress. What are the signs or symptoms?  High blood pressure may not cause symptoms. Very high blood pressure (hypertensive crisis) may cause: ? Headache. ? Feelings of worry or nervousness (anxiety). ? Shortness of breath. ? Nosebleed. ? A feeling of being sick to your stomach (nausea). ? Throwing up (vomiting). ? Changes in how you see. ? Very bad chest pain. ? Seizures. How is this treated?  This condition is treated by making healthy  lifestyle changes, such as: ? Eating healthy foods. ? Exercising more. ? Drinking less alcohol.  Your health care provider may prescribe medicine if lifestyle changes are not enough to get your blood pressure under control, and if: ? Your top number is above 130. ? Your bottom number is above 80.  Your personal target blood pressure may vary. Follow these instructions at home: Eating and drinking   If told, follow the DASH eating plan. To follow this plan: ? Fill one half of your plate at each meal with fruits and vegetables. ? Fill one fourth of your plate at each meal with whole grains. Whole grains include whole-wheat pasta, brown rice, and whole-grain bread. ? Eat or drink low-fat dairy products, such as skim milk or low-fat yogurt. ? Fill one fourth of your plate at each meal with low-fat (lean) proteins. Low-fat proteins include fish, chicken without skin, eggs, beans, and tofu. ? Avoid fatty meat, cured and processed meat, or chicken with skin. ? Avoid pre-made or processed food.  Eat less than 1,500 mg of salt each day.  Do not drink alcohol if: ? Your doctor tells you not to drink. ? You are pregnant, may be pregnant, or are planning to become pregnant.  If you drink alcohol: ? Limit how much you use to:  0-1 drink a day for women.  0-2 drinks a day for men. ? Be aware of how much alcohol is in your drink. In the [place] one drink equals one 12 oz bottle of beer (355 mL), one 5 oz glass of wine (148 mL), or one 1 oz glass  of hard liquor (44 mL). Lifestyle   Work with your doctor to stay at a healthy weight or to lose weight. Ask your doctor what the best weight is for you.  Get at least 30 minutes of exercise most days of the week. This may include walking, swimming, or biking.  Get at least 30 minutes of exercise that strengthens your muscles (resistance exercise) at least 3 days a week. This may include lifting weights or doing Pilates.  Do not use any products  that contain nicotine or tobacco, such as cigarettes, e-cigarettes, and chewing tobacco. If you need help quitting, ask your doctor.  Check your blood pressure at home as told by your doctor.  Keep all follow-up visits as told by your doctor. This is important. Medicines  Take over-the-counter and prescription medicines only as told by your doctor. Follow directions carefully.  Do not skip doses of blood pressure medicine. The medicine does not work as well if you skip doses. Skipping doses also puts you at risk for problems.  Ask your doctor about side effects or reactions to medicines that you should watch for. Contact a doctor if you:  Think you are having a reaction to the medicine you are taking.  Have headaches that keep coming back (recurring).  Feel dizzy.  Have swelling in your ankles.  Have trouble with your vision. Get help right away if you:  Get a very bad headache.  Start to feel mixed up (confused).  Feel weak or numb.  Feel faint.  Have very bad pain in your: ? Chest. ? Belly (abdomen).  Throw up more than once.  Have trouble breathing. Summary  Hypertension is another name for high blood pressure.  High blood pressure forces your heart to work harder to pump blood.  For most people, a normal blood pressure is less than 120/80.  Making healthy choices can help lower blood pressure. If your blood pressure does not get lower with healthy choices, you may need to take medicine. This information is not intended to replace advice given to you by your health care provider. Make sure you discuss any questions you have with your health care provider. Document Revised: 03/11/2018 Document Reviewed: 03/11/2018 Elsevier Patient Education  2020 [employer].

## 2019-09-16 NOTE — Progress Notes (Signed)
Patient ID: Jeremy [name], male    DOB: [birth year]  MRN: [id number]  CC: Hypertensive heart disease with chronic combined systolic and diastolic congestive heart failure follow-up  Subjective: Jeremy [name] is a 39 y.o. male  With history of hypertensive heart disease without congestive heart failure and nonischemic cardiomyopathy who presents for medication management and follow-up.  1. HYPERTENSION FOLLOW-UP:   Currently taking: see medication list Med Adherence: _0  Yes    _1  No ran out of medication 2 days ago Medication side effects: _2  Yes    _3  No Adherence with salt restriction: _4  Yes    _5  No Exercise: Yes _6  No _7   Back injury prevents physical activity Home Monitoring?: _8  Yes    _9  No Monitoring Frequency: _10  Yes, 2 times per week Home BP results range: _11  Yes 120/80 to 139/90 SOB? _12  Yes    _13  No Chest Pain?: _14  Yes    _15  No Leg swelling?: _16  Yes    _17  No Headaches?: _18  Yes, migraine history, not frequent Dizziness? _19  Yes    _20  No Comments: None  2. HEART FAILURE FOLLOW-UP:  Currently taking:  See med list Taking ACEI/ARB?  yes Taking beta blocker?  yes Med Adherence:  No, ran out of meds 2 days ago Medication side effects:  none Adherence with salt restriction:  Denies Shortness of breath?  no Leg swelling?  no Monitoring weight at home?  no denies  Weight change?  Yes, last visit December 2019 weight 272 pounds. Today's visit 286 pounds. Last echocardiogram: November 2015. No current plans to get echo as he does not have health insurance.  Last ejection fraction:  35 to 40% Followed by Cardiology?  Denies. No health insurance. Date last seen by Cardiology:  2015   Patient Active Problem List   Diagnosis Date Noted  . Tobacco abuse 07/02/2018  . Hypertensive heart disease without heart failure 04/27/2015  . Excess ear wax 04/27/2015  . Hypertensive heart disease without congestive heart failure   . NICM (nonischemic  cardiomyopathy) ([place])      Current Outpatient Medications on File Prior to Visit  Medication Sig Dispense Refill  . amLODipine (NORVASC) 5 MG tablet Take 1 tablet (5 mg total) by mouth daily. 90 tablet 1  . buPROPion (WELLBUTRIN SR) 150 MG 12 hr tablet Take 1 tablet (150 mg total) by mouth 2 (two) times daily. 60 tablet 3  . carvedilol (COREG) 12.5 MG tablet Take 1 tablet (12.5 mg total) by mouth 2 (two) times daily with a meal. 180 tablet 1  . lisinopril (ZESTRIL) 20 MG tablet Take 1 tablet (20 mg total) by mouth daily. 90 tablet 1  . methocarbamol (ROBAXIN) 500 MG tablet Take 2 tablets (1,000 mg total) by mouth 2 (two) times daily as needed for muscle spasms. 60 tablet 1  . traMADol (ULTRAM) 50 MG tablet Take 1 tablet (50 mg total) by mouth every 12 (twelve) hours as needed. 30 tablet 0   No current facility-administered medications on file prior to visit.    Allergies  Allergen Reactions  . Ampicillin Hives  . Penicillins Hives    Social History   Socioeconomic History  . Marital status: Single    Spouse name: Not on file  . Number of children: Not on file  . Years of education: Not on file  . Highest education level: Not on file  Occupational History  . Not on file  Tobacco Use  . Smoking status: Current Every Day Smoker  .  Smokeless tobacco: Current User  Substance and Sexual Activity  . Alcohol use: Yes  . Drug use: Yes    Types: Marijuana  . Sexual activity: Not on file  Other Topics Concern  . Not on file  Social History Narrative  . Not on file   Social Determinants of Health   Financial Resource Strain:   . Difficulty of Paying Living Expenses: Not on file  Food Insecurity:   . Worried About Charity fundraiser in the Last Year: Not on file  . Ran Out of Food in the Last Year: Not on file  Transportation Needs:   . Lack of Transportation (Medical): Not on file  . Lack of Transportation (Non-Medical): Not on file  Physical Activity:   . Days of Exercise  per Week: Not on file  . Minutes of Exercise per Session: Not on file  Stress:   . Feeling of Stress : Not on file  Social Connections:   . Frequency of Communication with Friends and Family: Not on file  . Frequency of Social Gatherings with Friends and Family: Not on file  . Attends Religious Services: Not on file  . Active Member of Clubs or Organizations: Not on file  . Attends Archivist Meetings: Not on file  . Marital Status: Not on file  Intimate Partner Violence:   . Fear of Current or Ex-Partner: Not on file  . Emotionally Abused: Not on file  . Physically Abused: Not on file  . Sexually Abused: Not on file    Family History  Problem Relation Age of Onset  . Heart attack Mother   . Cancer Mother   . Heart failure Maternal Grandfather   . Heart attack Maternal Grandfather   . Cancer Maternal Grandmother   . Stroke Neg Hx     No past surgical history on file.  ROS: Review of Systems  Respiratory: Negative for cough, shortness of breath and wheezing.   Cardiovascular: Negative for chest pain, palpitations and leg swelling.  Gastrointestinal: Negative for heartburn, nausea and vomiting.  Negative except as stated above  PHYSICAL EXAM: Vitals with BMI 09/16/2019 07/02/2018 10/02/2016  Height _0  _1  _2   Weight 286 lbs 13 oz 272 lbs 10 oz 269 lbs  BMI 38.89 34.91 79.1  Systolic 505 697 948  Diastolic 92 87 86  Pulse 71 83 98   Physical Exam General appearance - alert, well appearing, and in no distress, oriented to person, place, and time and overweight Mental status - alert, oriented to person, place, and time, normal mood, behavior, speech, dress, motor activity, and thought processes Chest - clear to auscultation, no wheezes, rales or rhonchi, symmetric air entry, no tachypnea, retractions or cyanosis Heart - normal rate, regular rhythm, normal S1, S2, no murmurs, rubs, clicks or gallops Neurological - alert, oriented, normal speech, no focal  findings or movement disorder noted, neck supple without rigidity, cranial nerves II through XII intact, funduscopic exam normal, discs flat and sharp, DTR's normal and symmetric, normal muscle tone, no tremors, strength 5/5, Romberg sign negative, normal gait and station Musculoskeletal - no joint tenderness, deformity or swelling Extremities - peripheral pulses normal, no pedal edema, no clubbing or cyanosis, no pedal edema noted  CMP Latest Ref Rng & Units 07/02/2018 10/02/2016 04/27/2015  Glucose 65 - 99 mg/dL 89 93 85  BUN 6 - 20 mg/dL _3 Creatinine 0.76 - 1.27 mg/dL 1.03 0.94 0.98  Sodium 134 - 144 mmol/L  138 139 138  Potassium 3.5 - 5.2 mmol/L 4.0 4.2 4.3  Chloride 96 - 106 mmol/L 99 103 104  CO2 20 - 29 mmol/L _0 Calcium 8.7 - 10.2 mg/dL 9.7 9.3 8.8  Total Protein 6.0 - 8.5 g/dL 8.3 8.2(H) 7.8  Total Bilirubin 0.0 - 1.2 mg/dL 0.3 0.3 0.5  Alkaline Phos 39 - 117 IU/L 78 73 69  AST 0 - 40 IU/L _1 ALT 0 - 44 IU/L _2 Lipid Panel     Component Value Date/Time   CHOL 175 07/02/2018 1139   TRIG 115 07/02/2018 1139   HDL 40 07/02/2018 1139   CHOLHDL 4.4 07/02/2018 1139   CHOLHDL 4.7 10/02/2016 1159   VLDL 21 10/02/2016 1159   LDLCALC 112 (H) 07/02/2018 1139    CBC    Component Value Date/Time   WBC 8.1 10/02/2016 1159   RBC 5.06 10/02/2016 1159   HGB 14.9 10/02/2016 1159   HCT 43.7 10/02/2016 1159   PLT 310 10/02/2016 1159   MCV 86.4 10/02/2016 1159   MCH 29.4 10/02/2016 1159   MCHC 34.1 10/02/2016 1159   RDW 14.8 10/02/2016 1159   LYMPHSABS 3,969 (H) 10/02/2016 1159   MONOABS 405 10/02/2016 1159   EOSABS 243 10/02/2016 1159   BASOSABS 0 10/02/2016 1159    ASSESSMENT AND PLAN: 1. Hypertensive heart disease with chronic combined systolic and diastolic congestive heart failure ([place]): - Lipid Panel - CBC With Differential - CMP14+EGFR - amLODipine (NORVASC) 5 MG tablet; Take 1 tablet (5 mg total) by mouth daily.  Dispense: 30 tablet;  Refill: 2 - lisinopril (ZESTRIL) 20 MG tablet; Take 1 tablet (20 mg total) by mouth daily.  Dispense: 30 tablet; Refill: 2 - carvedilol (COREG) 12.5 MG tablet; Take 1 tablet (12.5 mg total) by mouth 2 (two) times daily with a meal.  Dispense: 60 tablet; Refill: 2 -Continue medications, fluid restriction, daily weights -Check blood pressure at least once daily for the next 2 weeks. -Follow-up with clinical pharmacist in 2 weeks for blood pressure check. Brig blood pressure readings to this visit. -Follow-up with attending physician in 3 months for management of chronic disease.  -Apply for [facility] discount/orange card. -Referral for repeat echocardiogram still needed pending acquiring health insurance. -Counseled on blood pressure goal of less than 130/80, low-sodium, DASH diet, medication compliance, 150 minutes of moderate intensity exercise per week. Discussed medication compliance, adverse effects. . -Follow a Healthy Eating Plan - You can do it! . Limit sugary drinks.  Avoid sodas, sweet tea, sport or energy drinks, or fruit drinks.  Drink water, lo-fat milk, or diet drinks. . Limit snack foods.   Cut back on candy, cake, cookies, chips, ice cream.  These are a special treat, only in small amounts. . Eat plenty of vegetables.  Especially dark green, red, and orange vegetables. Aim for at least 3 servings a day. More is better! Include fruit in your daily diet.  Whole fruit is much healthier than fruit juice! . Limit "white" bread, "white" pasta, "white" rice.   Choose "100% whole grain" products, brown or wild rice. Marland Kitchen Avoid fatty meats. Try "Meatless Monday" and choose eggs or beans one day a week.  When eating meat, choose lean meats like chicken, [place], and fish.  Grill, broil, or bake meats instead of frying, and eat poultry without the skin. . Eat less salt.  Avoid frozen pizzas, frozen dinners and salty foods.  Use seasonings other than  salt in cooking.  This can help blood pressure  and keep you from swelling . Beer, wine and liquor have calories.  If you can safely drink alcohol, limit to 1 drink per day for women, 2 drinks for men  Patient was given the opportunity to ask questions.  Patient verbalized understanding of the plan and was able to repeat key elements of the plan. Patient was given clear instructions to go to Emergency Department or return to medical center if symptoms don't improve, worsen, or new problems develop.The patient verbalized understanding.   Requested Prescriptions    No prescriptions requested or ordered in this encounter    Brittnie Lewey Zachery Dauer, NP

## 2019-09-17 ENCOUNTER — Telehealth: Payer: Self-pay

## 2019-09-17 LAB — CBC WITH DIFFERENTIAL
Basophils Absolute: 0.1 10*3/uL (ref 0.0–0.2)
Basos: 1 %
EOS (ABSOLUTE): 0.2 10*3/uL (ref 0.0–0.4)
Eos: 3 %
Hematocrit: 43.8 % (ref 37.5–51.0)
Hemoglobin: 15 g/dL (ref 13.0–17.7)
Immature Grans (Abs): 0 10*3/uL (ref 0.0–0.1)
Immature Granulocytes: 0 %
Lymphocytes Absolute: 3.1 10*3/uL (ref 0.7–3.1)
Lymphs: 41 %
MCH: 29.4 pg (ref 26.6–33.0)
MCHC: 34.2 g/dL (ref 31.5–35.7)
MCV: 86 fL (ref 79–97)
Monocytes Absolute: 0.3 10*3/uL (ref 0.1–0.9)
Monocytes: 5 %
Neutrophils Absolute: 3.8 10*3/uL (ref 1.4–7.0)
Neutrophils: 50 %
RBC: 5.11 x10E6/uL (ref 4.14–5.80)
RDW: 14 % (ref 11.6–15.4)
WBC: 7.5 10*3/uL (ref 3.4–10.8)

## 2019-09-17 LAB — CMP14+EGFR
ALT: 22 IU/L (ref 0–44)
AST: 21 IU/L (ref 0–40)
Albumin/Globulin Ratio: 1.3 (ref 1.2–2.2)
Albumin: 4.5 g/dL (ref 4.0–5.0)
Alkaline Phosphatase: 85 IU/L (ref 39–117)
BUN/Creatinine Ratio: 9 (ref 9–20)
BUN: 9 mg/dL (ref 6–24)
Bilirubin Total: 0.3 mg/dL (ref 0.0–1.2)
CO2: 21 mmol/L (ref 20–29)
Calcium: 9.1 mg/dL (ref 8.7–10.2)
Chloride: 104 mmol/L (ref 96–106)
Creatinine, Ser: 0.95 mg/dL (ref 0.76–1.27)
GFR calc Af Amer: 115 mL/min/{1.73_m2} (ref >59)
GFR calc non Af Amer: 100 mL/min/{1.73_m2} (ref >59)
Globulin, Total: 3.4 g/dL (ref 1.5–4.5)
Glucose: 88 mg/dL (ref 65–99)
Potassium: 4.2 mmol/L (ref 3.5–5.2)
Sodium: 141 mmol/L (ref 134–144)
Total Protein: 7.9 g/dL (ref 6.0–8.5)

## 2019-09-17 LAB — LIPID PANEL
Chol/HDL Ratio: 4.3 ratio (ref 0.0–5.0)
Cholesterol, Total: 162 mg/dL (ref 100–199)
HDL: 38 mg/dL — ABNORMAL LOW (ref >39)
LDL Chol Calc (NIH): 103 mg/dL — ABNORMAL HIGH (ref 0–99)
Triglycerides: 113 mg/dL (ref 0–149)
VLDL Cholesterol Cal: 21 mg/dL (ref 5–40)

## 2019-09-17 NOTE — Progress Notes (Signed)
Please call patient with update. CBC and CMP normal. Lipid panel has slightly low HDL which is "good cholesterol" and slightly high LDL which is "bad cholesterol".   Counseled patient on importance of low-sodium/low-fat diet and at least 150 minutes moderate intensity exercise weekly. Follow-up with clinical pharmacist in 2 weeks for blood pressure check and attending physician in about 3 months for chronic disease management.

## 2019-09-17 NOTE — Progress Notes (Signed)
I called patient with update of lab results, CBC/CMP/lipid panel.   CBC and CMP normal.   Lipid panel has slightly low HDL which is "good cholesterol" and slightly high LDL which is "bad cholesterol". Counseled patient on importance of low-sodium/low-fat diet and at least 150 minutes moderate intensity exercise weekly. Follow-up with clinical pharmacist in 2 weeks for blood pressure check and attending physician in about 3 months.

## 2019-09-17 NOTE — Telephone Encounter (Signed)
Patient name and DOB has been verified Patient was informed of lab results. Patient had no questions.  

## 2019-09-17 NOTE — Telephone Encounter (Signed)
-----   Message from Rema Fendt, NP sent at 09/17/2019  8:02 AM EST ----- Please call patient with update. CBC and CMP normal. Lipid panel has slightly low HDL which is "good cholesterol" and slightly high LDL which is "bad cholesterol".   Counseled patient on importance of low-sodium/low-fat diet and at least 150 minutes moderate intensity exercise weekly. Follow-up with clinical pharmacist in 2 weeks for blood pressure check and attending physician in about 3 months for chronic disea se management.

## 2019-10-07 ENCOUNTER — Ambulatory Visit: Payer: Self-pay | Attending: Internal Medicine

## 2019-10-07 DIAGNOSIS — Z23 Encounter for immunization: Secondary | ICD-10-CM

## 2019-10-07 NOTE — Progress Notes (Signed)
   Covid-19 Vaccination Clinic  Name:  Jeremy [name]    MRN: [id number] DOB: 08-15-1980  10/07/2019  [name] was observed post Covid-19 immunization for 15 minutes without incident. He was provided with Vaccine Information Sheet and instruction to access the V-Safe system.   [name] was instructed to call 911 with any severe reactions post vaccine: Marland Kitchen Difficulty breathing  . Swelling of face and throat  . A fast heartbeat  . A bad rash all over body  . Dizziness and weakness   Immunizations Administered    Name Date Dose VIS Date Route   Pfizer COVID-19 Vaccine 10/07/2019 11:47 AM 0.3 mL 06/25/2019 Intramuscular   Manufacturer: [employer], [employer]   Lot: [id number]   NDC: [address]-6

## 2019-11-01 ENCOUNTER — Ambulatory Visit: Payer: Self-pay | Attending: Internal Medicine

## 2019-11-01 DIAGNOSIS — Z23 Encounter for immunization: Secondary | ICD-10-CM

## 2019-11-01 NOTE — Progress Notes (Signed)
   Covid-19 Vaccination Clinic  Name:  JLYNN LANGILLE    MRN: [id number] DOB: 1981-03-04  11/01/2019  Mr. Boyett was observed post Covid-19 immunization for 15 minutes without incident. He was provided with Vaccine Information Sheet and instruction to access the V-Safe system.   Mr. Wacha was instructed to call 911 with any severe reactions post vaccine: Marland Kitchen Difficulty breathing  . Swelling of face and throat  . A fast heartbeat  . A bad rash all over body  . Dizziness and weakness   Immunizations Administered    Name Date Dose VIS Date Route   Pfizer COVID-19 Vaccine 11/01/2019 10:51 AM 0.3 mL 09/08/2018 Intramuscular   Manufacturer: [employer], [employer]   Lot: [id number]   NDC: [address]-3

## 2019-12-24 ENCOUNTER — Telehealth: Payer: Self-pay | Admitting: Family Medicine

## 2019-12-24 DIAGNOSIS — M546 Pain in thoracic spine: Secondary | ICD-10-CM

## 2019-12-24 MED ORDER — METHOCARBAMOL 500 MG PO TABS: 1000.0000 mg | ORAL_TABLET | Freq: Two times a day (BID) | ORAL | 1 refills | Status: DC | PRN

## 2019-12-24 MED ORDER — TRAMADOL HCL 50 MG PO TABS: 50.0000 mg | ORAL_TABLET | Freq: Two times a day (BID) | ORAL | 0 refills | Status: DC | PRN

## 2019-12-24 NOTE — Telephone Encounter (Signed)
Patient was called, verified by 2 patient identifiers, informed of prescriptions being refilled, verbalized understanding and had no further questions or concerns at this time.

## 2019-12-24 NOTE — Telephone Encounter (Signed)
Patient called and requested for listed medications to be refilled and sent to [facility]. methocarbamol (ROBAXIN) 500 MG tablet [[id number]] traMADol (ULTRAM) 50 MG tablet [[id number]]

## 2019-12-24 NOTE — Telephone Encounter (Signed)
Refilled

## 2020-06-20 ENCOUNTER — Inpatient Hospital Stay: Payer: Self-pay | Attending: Hematology and Oncology

## 2020-06-20 ENCOUNTER — Other Ambulatory Visit: Payer: Self-pay

## 2020-06-20 DIAGNOSIS — Z23 Encounter for immunization: Secondary | ICD-10-CM | POA: Insufficient documentation

## 2020-06-20 NOTE — Progress Notes (Signed)
   Covid-19 Vaccination Clinic  Name:  Jeremy [name]    MRN: [id number] DOB: [birth year]  06/20/2020  Jeremy [name] was observed post Covid-19 immunization for 30 minutes without incident. He was provided with Vaccine Information Sheet and instruction to access the V-Safe system.   Jeremy [name] was instructed to call 911 with any severe reactions post vaccine: Marland Kitchen Difficulty breathing  . Swelling of face and throat  . A fast heartbeat  . A bad rash all over body  . Dizziness and weakness   Immunizations Administered    Name Date Dose VIS Date Route   Pfizer COVID-19 Vaccine 06/20/2020 12:19 PM 0.3 mL 05/03/2020 Intramuscular   Manufacturer: [employer], [employer]   Lot: [id number]   NDC: [address]-3

## 2021-01-05 ENCOUNTER — Encounter (HOSPITAL_BASED_OUTPATIENT_CLINIC_OR_DEPARTMENT_OTHER): Payer: Self-pay

## 2021-01-05 ENCOUNTER — Other Ambulatory Visit: Payer: Self-pay

## 2021-01-05 ENCOUNTER — Emergency Department (HOSPITAL_BASED_OUTPATIENT_CLINIC_OR_DEPARTMENT_OTHER): Payer: Self-pay

## 2021-01-05 ENCOUNTER — Emergency Department (HOSPITAL_BASED_OUTPATIENT_CLINIC_OR_DEPARTMENT_OTHER)
Admission: EM | Admit: 2021-01-05 | Discharge: 2021-01-05 | Disposition: A | Discharge status: Home / Self Care | Payer: Self-pay | Attending: Emergency Medicine | Admitting: Emergency Medicine

## 2021-01-05 DIAGNOSIS — Z79899 Other long term (current) drug therapy: Secondary | ICD-10-CM | POA: Insufficient documentation

## 2021-01-05 DIAGNOSIS — F17200 Nicotine dependence, unspecified, uncomplicated: Secondary | ICD-10-CM | POA: Insufficient documentation

## 2021-01-05 DIAGNOSIS — I10 Essential (primary) hypertension: Secondary | ICD-10-CM

## 2021-01-05 DIAGNOSIS — I712 Thoracic aortic aneurysm, without rupture, unspecified: Secondary | ICD-10-CM

## 2021-01-05 DIAGNOSIS — I119 Hypertensive heart disease without heart failure: Secondary | ICD-10-CM | POA: Insufficient documentation

## 2021-01-05 LAB — COMPREHENSIVE METABOLIC PANEL
ALT: 11 U/L (ref 0–44)
AST: 15 U/L (ref 15–41)
Albumin: 4 g/dL (ref 3.5–5.0)
Alkaline Phosphatase: 65 U/L (ref 38–126)
Anion gap: 9 (ref 5–15)
BUN: 14 mg/dL (ref 6–20)
CO2: 25 mmol/L (ref 22–32)
Calcium: 9.2 mg/dL (ref 8.9–10.3)
Chloride: 107 mmol/L (ref 98–111)
Creatinine, Ser: 0.9 mg/dL (ref 0.61–1.24)
GFR, Estimated: >60 mL/min (ref >60)
Glucose, Bld: 86 mg/dL (ref 70–99)
Potassium: 3.4 mmol/L — ABNORMAL LOW (ref 3.5–5.1)
Sodium: 141 mmol/L (ref 135–145)
Total Bilirubin: 0.4 mg/dL (ref 0.3–1.2)
Total Protein: 7.1 g/dL (ref 6.5–8.1)

## 2021-01-05 LAB — CBC WITH DIFFERENTIAL/PLATELET
Abs Immature Granulocytes: 0.02 10*3/uL (ref 0.00–0.07)
Basophils Absolute: 0.1 10*3/uL (ref 0.0–0.1)
Basophils Relative: 1 %
Eosinophils Absolute: 0.3 10*3/uL (ref 0.0–0.5)
Eosinophils Relative: 3 %
HCT: 39 % (ref 39.0–52.0)
Hemoglobin: 13.7 g/dL (ref 13.0–17.0)
Immature Granulocytes: 0 %
Lymphocytes Relative: 51 %
Lymphs Abs: 4.9 10*3/uL — ABNORMAL HIGH (ref 0.7–4.0)
MCH: 29.9 pg (ref 26.0–34.0)
MCHC: 35.1 g/dL (ref 30.0–36.0)
MCV: 85.2 fL (ref 80.0–100.0)
Monocytes Absolute: 0.6 10*3/uL (ref 0.1–1.0)
Monocytes Relative: 6 %
Neutro Abs: 3.8 10*3/uL (ref 1.7–7.7)
Neutrophils Relative %: 39 %
Platelets: 248 10*3/uL (ref 150–400)
RBC: 4.58 MIL/uL (ref 4.22–5.81)
RDW: 15.9 % — ABNORMAL HIGH (ref 11.5–15.5)
WBC: 9.7 10*3/uL (ref 4.0–10.5)
nRBC: 0 % (ref 0.0–0.2)

## 2021-01-05 LAB — TROPONIN I (HIGH SENSITIVITY)
Troponin I (High Sensitivity): 44 ng/L — ABNORMAL HIGH (ref <18)
Troponin I (High Sensitivity): 45 ng/L — ABNORMAL HIGH (ref <18)

## 2021-01-05 MED ORDER — KETOROLAC TROMETHAMINE 15 MG/ML IJ SOLN
15.0000 mg | Freq: Once | INTRAMUSCULAR | Status: AC
  Administered 2021-01-05: 15 mg via INTRAVENOUS
  Filled 2021-01-05: qty 1

## 2021-01-05 MED ORDER — CYCLOBENZAPRINE HCL 5 MG PO TABS
5.0000 mg | ORAL_TABLET | Freq: Once | ORAL | Status: AC
  Administered 2021-01-05: 5 mg via ORAL
  Filled 2021-01-05: qty 1

## 2021-01-05 MED ORDER — METHOCARBAMOL 500 MG PO TABS
500.0000 mg | ORAL_TABLET | Freq: Once | ORAL | Status: AC
  Administered 2021-01-05: 500 mg via ORAL
  Filled 2021-01-05: qty 1

## 2021-01-05 MED ORDER — ACETAMINOPHEN 500 MG PO TABS
1000.0000 mg | ORAL_TABLET | Freq: Once | ORAL | Status: AC
  Administered 2021-01-05: 1000 mg via ORAL
  Filled 2021-01-05: qty 2

## 2021-01-05 MED ORDER — TIZANIDINE HCL 4 MG PO TABS: 4.0000 mg | ORAL_TABLET | Freq: Four times a day (QID) | ORAL | 0 refills | Status: DC | PRN

## 2021-01-05 MED ORDER — IOHEXOL 350 MG/ML SOLN
100.0000 mL | Freq: Once | INTRAVENOUS | Status: AC | PRN
  Administered 2021-01-05: 100 mL via INTRAVENOUS

## 2021-01-05 NOTE — ED Provider Notes (Signed)
[facility] EMERGENCY DEPT Provider Note   CSN: [id number] Arrival date & time: 01/05/21  0209     History Chief Complaint  Patient presents with   Back Pain   Chest Pain    Jeremy [name] is a 40 y.o. male.   Back Pain Location:  Generalized Quality:  Aching Radiates to:  Does not radiate Pain severity:  Moderate Duration:  3 hours Timing:  Constant Chronicity:  New Relieved by:  None tried Worsened by:  Nothing Ineffective treatments:  None tried Associated symptoms: chest pain and tingling (left leg a few days ago)   Associated symptoms: no abdominal pain, no fever and no pelvic pain   Chest Pain Associated symptoms: back pain   Associated symptoms: no abdominal pain and no fever       Past Medical History:  Diagnosis Date   Back pain    Hx of cardiac catheterization    a. 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 EF 35 to 40% by LHC on 05/15/14 likely due to hypertensive CM    Patient Active Problem List   Diagnosis Date Noted   Tobacco abuse 07/02/2018   Hypertensive heart disease without heart failure 04/27/2015   Excess ear wax 04/27/2015   Hypertensive heart disease without congestive heart failure    NICM (nonischemic cardiomyopathy) (HCC)     No past surgical history on file.     Family History  Problem Relation Age of Onset   Heart attack Mother    Cancer Mother    Heart failure Maternal Grandfather    Heart attack Maternal Grandfather    Cancer Maternal Grandmother    Stroke Neg Hx     Social History   Tobacco Use   Smoking status: Every Day    Pack years: 0.00   Smokeless tobacco: Current  Substance Use Topics   Alcohol use: Yes   Drug use: Yes    Types: Marijuana    Home  Medications Prior to Admission medications   Medication Sig Start Date End Date Taking? Authorizing Provider  tiZANidine (ZANAFLEX) 4 MG tablet Take 1 tablet (4 mg total) by mouth every 6 (six) hours as needed for muscle spasms. 01/05/21  Yes Enisa Runyan, Barbara Cower, MD  amLODipine (NORVASC) 5 MG tablet Take 1 tablet (5 mg total) by mouth daily. 09/16/19   Rema Fendt, NP  carvedilol (COREG) 12.5 MG tablet Take 1 tablet (12.5 mg total) by mouth 2 (two) times daily with a meal. 09/16/19   Zonia Kief, Amy J, NP  lisinopril (ZESTRIL) 20 MG tablet Take 1 tablet (20 mg total) by mouth daily. 09/16/19   Rema Fendt, NP  traMADol (ULTRAM) 50 MG tablet Take 1 tablet (50 mg total) by mouth every 12 (twelve) hours as needed. 12/24/19   Hoy Register, MD    Allergies    Ampicillin and Penicillins  Review of Systems   Review of Systems  Constitutional:  Negative for fever.  Cardiovascular:  Positive for chest pain.  Gastrointestinal:  Negative for abdominal pain.  Genitourinary:  Negative for pelvic pain.  Musculoskeletal:  Positive for back pain.  Neurological:  Positive for tingling (left leg a few days ago).  All other systems reviewed and are negative.  Physical Exam Updated Vital Signs BP Marland Kitchen)  184/93   Pulse 61   Temp 98.1 F (36.7 C) (Oral)   Resp 16   Ht 6' (1.829 m)   Wt 117.9 kg   SpO2 98%   BMI 35.26 kg/m   Physical Exam Vitals and nursing note reviewed.  Constitutional:      Appearance: He is well-developed.  HENT:     Head: Normocephalic and atraumatic.     Nose: No congestion or rhinorrhea.     Mouth/Throat:     Mouth: Mucous membranes are moist.     Pharynx: Oropharynx is clear.  Eyes:     Pupils: Pupils are equal, round, and reactive to light.  Cardiovascular:     Rate and Rhythm: Normal rate.  Pulmonary:     Effort: Pulmonary effort is normal. No respiratory distress.  Abdominal:     General: There is no distension.  Musculoskeletal:        General: Normal range of  motion.     Cervical back: Normal range of motion.  Skin:    General: Skin is warm and dry.     Coloration: Skin is not jaundiced or pale.  Neurological:     General: No focal deficit present.     Mental Status: He is alert.    ED Results / Procedures / Treatments   Labs (all labs ordered are listed, but only abnormal results are displayed) Labs Reviewed  CBC WITH DIFFERENTIAL/PLATELET - Abnormal; Notable for the following components:      Result Value   RDW 15.9 (*)    Lymphs Abs 4.9 (*)    All other components within normal limits  COMPREHENSIVE METABOLIC PANEL - Abnormal; Notable for the following components:   Potassium 3.4 (*)    All other components within normal limits  TROPONIN I (HIGH SENSITIVITY) - Abnormal; Notable for the following components:   Troponin I (High Sensitivity) 44 (*)    All other components within normal limits  TROPONIN I (HIGH SENSITIVITY) - Abnormal; Notable for the following components:   Troponin I (High Sensitivity) 45 (*)    All other components within normal limits    EKG EKG Interpretation  Date/Time:  Friday January 05 2021 02:29:55 EDT Ventricular Rate:  78 PR Interval:  188 QRS Duration: 112 QT Interval:  376 QTC Calculation: 429 R Axis:   74 Text Interpretation: Sinus rhythm Left ventricular hypertrophy Nonspecific T abnormalities, inferior leads ST elevation, consider anterolateral injury Baseline wander in lead(s) V3 Confirmed by Marily Memos [phone]) on 01/05/2021 2:35:04 AM  Radiology CT Angio Chest/Abd/Pel for Dissection W and/or Wo Contrast  Result Date: 01/05/2021 CLINICAL DATA:  mid back pain that radiates on his chest - onset yesterday EXAM: CT ANGIOGRAPHY CHEST, ABDOMEN AND PELVIS TECHNIQUE: Non-contrast CT of the chest was initially obtained. Multidetector CT imaging through the chest, abdomen and pelvis was performed using the standard protocol during bolus administration of intravenous contrast. Multiplanar reconstructed  images and MIPs were obtained and reviewed to evaluate the vascular anatomy. CONTRAST:  OMNIPAQUE IOHEXOL 350 MG/ML SOLN COMPARISON:  None. FINDINGS: CTA CHEST FINDINGS Cardiovascular: Preferential opacification of the thoracic aorta. The ascending aorta measures enlarged in caliber on axial and coronal imaging: up to 4.6 cm. No definite fat stranding surrounding the aorta. No evidence of thoracic aortic dissection. Cardiomegaly. No pericardial effusion. The main pulmonary embolus is normal in caliber. Mediastinum/Nodes: No enlarged mediastinal, hilar, or axillary lymph nodes. Thyroid gland, trachea, and esophagus demonstrate no significant findings. Tiny hiatal hernia. Lungs/Pleura: Paraseptal emphysematous  changes at the right apex. Left base atelectasis. No pulmonary nodule. No pulmonary mass. No pleural effusion. No pneumothorax. Musculoskeletal: No chest wall abnormality. No suspicious lytic or blastic osseous lesions. No acute displaced fracture. Multilevel degenerative changes of the spine. Degenerative changes of the right sternoclavicular joint. Review of the MIP images confirms the above findings. CTA ABDOMEN AND PELVIS FINDINGS VASCULAR Aorta: Mild atherosclerotic plaque. Normal caliber aorta without aneurysm, dissection, vasculitis or significant stenosis. Celiac: Patent without evidence of aneurysm, dissection, vasculitis or significant stenosis. SMA: Patent without evidence of aneurysm, dissection, vasculitis or significant stenosis. 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 Both renal arteries are patent without evidence of aneurysm, dissection, vasculitis, fibromuscular dysplasia or significant stenosis. IMA: Patent without evidence of aneurysm, dissection, vasculitis or significant stenosis. Inflow: Mild atherosclerotic plaque. Patent without evidence of aneurysm, dissection, vasculitis or significant stenosis. Veins: No obvious venous abnormality within the  limitations of this arterial phase study. Review of the MIP images confirms the above findings. NON-VASCULAR Hepatobiliary: No focal liver abnormality. No gallstones, gallbladder wall thickening, or pericholecystic fluid. No biliary dilatation. Pancreas: No focal lesion. Normal pancreatic contour. No surrounding inflammatory changes. No main pancreatic ductal dilatation. Spleen: Normal in size without focal abnormality. Adrenals/Urinary Tract: No adrenal nodule bilaterally. Bilateral kidneys enhance symmetrically. No hydronephrosis. No hydroureter. The urinary bladder is unremarkable. Stomach/Bowel: Stomach is within normal limits. No evidence of bowel wall thickening or dilatation. Few scattered colonic diverticulosis. Appendix appears normal. Lymphatic: No lymphadenopathy. Reproductive: Prostate is unremarkable. Other: No intraperitoneal free fluid. No intraperitoneal free gas. No organized fluid collection. Musculoskeletal: No abdominal wall hernia or abnormality. No suspicious lytic or blastic osseous lesions. No acute displaced fracture. Multilevel degenerative changes of the spine. Review of the MIP images confirms the above findings. IMPRESSION: 1. Aneurysmal ascending thoracic aorta. Ascending thoracic aortic aneurysm. Recommend semi-annual imaging followup by CTA or MRA and referral to cardiothoracic surgery if not already obtained. This recommendation follows 2010 ACCF/AHA/AATS/ACR/ASA/SCA/SCAI/SIR/STS/SVM Guidelines for the Diagnosis and Management of Patients With Thoracic Aortic Disease. Circulation. 2010; 121: R945-O592. Aortic aneurysm NOS (ICD10-I71.9) 2. Aortic Atherosclerosis (ICD10-I70.0) and Emphysema (ICD10-J43.9). Mild. 3. Cardiomegaly. 4. Otherwise no acute intrathoracic abnormality. 5. No acute intra-abdominal or intrapelvic abnormality. 6. Scattered colonic diverticulosis with no acute diverticulitis. Electronically Signed   By: Tish Frederickson M.D.   On: 01/05/2021 04:20     Procedures Procedures   Medications Ordered in ED Medications  ketorolac (TORADOL) 15 MG/ML injection 15 mg (has no administration in time range)  methocarbamol (ROBAXIN) tablet 500 mg (has no administration in time range)  acetaminophen (TYLENOL) tablet 1,000 mg (1,000 mg Oral Given 01/05/21 0316)  cyclobenzaprine (FLEXERIL) tablet 5 mg (5 mg Oral Given 01/05/21 0317)  iohexol (OMNIPAQUE) 350 MG/ML injection 100 mL (100 mLs Intravenous Contrast Given 01/05/21 0355)    ED Course  I have reviewed the triage vital signs and the nursing notes.  Pertinent labs & imaging results that were available during my care of the patient were reviewed by me and considered in my medical decision making (see chart for details).    MDM Rules/Calculators/A&P                          No aortic dissection but has an aneurysm, will continue to follow with PCP. No pneumonia. Troponins slightly elevated but flat and likely related to HTN rather than ACS event. Patient with improved pain. Still some back pain but this is without red flags.  Plan for dc on symptomatic care with PCP follow up.  Final Clinical Impression(s) / ED Diagnoses Final diagnoses:  Hypertension, unspecified type  Thoracic aortic aneurysm without rupture (HCC)    Rx / DC Orders ED Discharge Orders          Ordered    tiZANidine (ZANAFLEX) 4 MG tablet  Every 6 hours PRN        01/05/21 0529             Gaynor Ferreras, Barbara Cower, MD 01/05/21 [phone]

## 2021-01-05 NOTE — ED Triage Notes (Signed)
Pt reports mid back pain that radiates on his chest - onset yesterday   PMX - HTN

## 2021-01-10 ENCOUNTER — Ambulatory Visit: Payer: Self-pay

## 2021-01-22 ENCOUNTER — Ambulatory Visit: Payer: Self-pay

## 2021-07-18 ENCOUNTER — Emergency Department (HOSPITAL_BASED_OUTPATIENT_CLINIC_OR_DEPARTMENT_OTHER): Payer: Self-pay | Admitting: Radiology

## 2021-07-18 ENCOUNTER — Emergency Department (HOSPITAL_BASED_OUTPATIENT_CLINIC_OR_DEPARTMENT_OTHER)
Admission: EM | Admit: 2021-07-18 | Discharge: 2021-07-18 | Disposition: A | Discharge status: Home / Self Care | Payer: Self-pay | Attending: Emergency Medicine | Admitting: Emergency Medicine

## 2021-07-18 ENCOUNTER — Encounter (HOSPITAL_BASED_OUTPATIENT_CLINIC_OR_DEPARTMENT_OTHER): Payer: Self-pay

## 2021-07-18 ENCOUNTER — Other Ambulatory Visit: Payer: Self-pay

## 2021-07-18 DIAGNOSIS — M899 Disorder of bone, unspecified: Secondary | ICD-10-CM | POA: Insufficient documentation

## 2021-07-18 DIAGNOSIS — S9032XA Contusion of left foot, initial encounter: Secondary | ICD-10-CM | POA: Insufficient documentation

## 2021-07-18 DIAGNOSIS — W208XXA Other cause of strike by thrown, projected or falling object, initial encounter: Secondary | ICD-10-CM | POA: Insufficient documentation

## 2021-07-18 NOTE — Discharge Instructions (Addendum)
As we discussed there is no evidence of fracture on your x-ray. Please use Tylenol or ibuprofen for pain.  You may use 600 mg ibuprofen every 6 hours or 1000 mg of Tylenol every 6 hours.  You may choose to alternate between the 2.  This would be most effective.  Not to exceed 4 g of Tylenol within 24 hours.  Not to exceed 3200 mg ibuprofen 24 hours.  Additionally I recommend that you use some compression such as an Ace wrap, keep it elevated, he can apply ice to the affected area and rest the affected foot.  If it is starting to feel better I do feel comfortable with you putting some weight on it, you can start with a postop shoe, and proceed to normal walking as you can tolerate.  The x-ray of your tibia showed:  Expansile lytic lesion in the distal tibia metaphysis with multiple  calcified internal septations. Differential considerations include  aneurysmal bone cyst, eosinophilic granuloma, and giant cell tumor,  Because it is difficult to make a diagnosis with an x-ray alone and I do recommend that you follow-up at your earliest convenience with orthopedics for further evaluation.

## 2021-07-18 NOTE — ED Triage Notes (Signed)
Pt was outside when a tree limb fell onto his L foot. Pt with pain and swelling.

## 2021-07-18 NOTE — ED Provider Notes (Signed)
[facility] EMERGENCY DEPT Provider Note   CSN: [id number] Arrival date & time: 07/18/21  1443     History  Chief Complaint  Patient presents with   Foot Injury    Jeremy [name] is a 41 y.o. male with no significant past medical history presents for evaluation of left foot pain after a tree branch fell on his left foot while he was taking the trash today.  Patient reports it is difficult to walk on the foot, there is pain and swelling around his fourth and fifth toes.  Patient has not taken anything for pain prior to arrival.  Patient denies any numbness or tingling of the affected foot.   Foot Injury     Home Medications Prior to Admission medications   Medication Sig Start Date End Date Taking? Authorizing Provider  amLODipine (NORVASC) 5 MG tablet Take 1 tablet (5 mg total) by mouth daily. 09/16/19   Rema Fendt, NP  carvedilol (COREG) 12.5 MG tablet Take 1 tablet (12.5 mg total) by mouth 2 (two) times daily with a meal. 09/16/19   Zonia Kief, Amy J, NP  lisinopril (ZESTRIL) 20 MG tablet Take 1 tablet (20 mg total) by mouth daily. 09/16/19   Rema Fendt, NP  tiZANidine (ZANAFLEX) 4 MG tablet Take 1 tablet (4 mg total) by mouth every 6 (six) hours as needed for muscle spasms. 01/05/21   Mesner, Barbara Cower, MD  traMADol (ULTRAM) 50 MG tablet Take 1 tablet (50 mg total) by mouth every 12 (twelve) hours as needed. 12/24/19   Hoy Register, MD      Allergies    Ampicillin and Penicillins    Review of Systems   Review of Systems  Musculoskeletal:  Positive for gait problem and joint swelling.  All other systems reviewed and are negative.  Physical Exam Updated Vital Signs BP (!) 149/77 (BP Location: Right Arm)    Pulse 64    Temp 97.9 F (36.6 C) (Oral)    Resp 17    Ht 6\' 1"  (1.854 m)    Wt 117.9 kg    SpO2 98%    BMI 34.30 kg/m  Physical Exam Vitals and nursing note reviewed.  Constitutional:      General: He is not in acute distress.    Appearance:  Normal appearance.  HENT:     Head: Normocephalic and atraumatic.  Eyes:     General:        Right eye: No discharge.        Left eye: No discharge.  Cardiovascular:     Rate and Rhythm: Normal rate and regular rhythm.     Pulses: Normal pulses.  Pulmonary:     Effort: Pulmonary effort is normal. No respiratory distress.  Musculoskeletal:        General: Swelling present. No deformity.     Comments: He has some significant focal tenderness and swelling, as well as a visible contusion overlying the fourth and fifth MTP on the left foot.  Patient has intact range of motion, and strength to flexion extension at the ankle, and of all toes.  Patient has some pain with weightbearing.  I do not palpate any step-off or deformity.  Physical exam does reveal a slight prominence of the tibia on the right, however it is difficult to appreciate from the outside.  Skin:    General: Skin is warm and dry.     Capillary Refill: Capillary refill takes less than 2 seconds.  Neurological:  Mental Status: He is alert and oriented to person, place, and time.  Psychiatric:        Mood and Affect: Mood normal.        Behavior: Behavior normal.    ED Results / Procedures / Treatments   Labs (all labs ordered are listed, but only abnormal results are displayed) Labs Reviewed - No data to display  EKG None  Radiology DG Tibia/Fibula Left  Result Date: 07/18/2021 CLINICAL DATA:  Distal tibial bone lesion on foot radiograph. EXAM: LEFT TIBIA AND FIBULA - 2 VIEW COMPARISON:  Foot radiograph earlier today. FINDINGS: Expansile lytic lesion in the distal tibia metaphysis with multiple calcified internal septations. Lesion measures approximately 6.2 x 3.7 x 5.1 cm. There is medial and lateral cortical thickening proximal to the lesion. No Alesia Banda in a transition. No definite periosteal reaction or bony destruction. No visualized soft tissue component. Lesion does not extend to the tibial plafond. No evidence of  pathologic fracture. Normal appearance of the proximal tibia and fibula. There is mild soft tissue edema of the lower leg. IMPRESSION: Expansile lytic lesion in the distal tibia metaphysis with multiple calcified internal septations. Differential considerations include aneurysmal bone cyst, eosinophilic granuloma, and giant cell tumor, amongst others. Recommend orthopedic referral. Further assessment with MRI (without and with IV contrast) can be performed on an elective basis. Electronically Signed   By: Narda Rutherford M.D.   On: 07/18/2021 20:19   DG Foot Complete Left  Result Date: 07/18/2021 CLINICAL DATA:  blunt trauma, Tree limb fell on pts foot, pain and swelling around entire foot EXAM: LEFT FOOT - COMPLETE 3+ VIEW COMPARISON:  None. FINDINGS: There is no evidence of fracture or dislocation. There is no evidence of arthropathy or other focal bone abnormality. Heterogeneous expansive lesion of the distal fibula. Mild subcutaneus soft tissue edema of the dorsal forefoot. IMPRESSION: 1. No acute displaced fracture or dislocation. 2. Heterogeneous expansive lesion of the distal fibula. Recommend dedicated left tibial fibular radiograph. Electronically Signed   By: Tish Frederickson M.D.   On: 07/18/2021 16:02    Procedures Procedures    Medications Ordered in ED Medications - No data to display  ED Course/ Medical Decision Making/ A&P                           Medical Decision Making    This is an overall well-appearing patient who presents for mechanical injury of the left foot after a tree branch fell onto it.  Patient reports that he is having difficulty walking on it but denies any numbness or tingling.  He has neurovascularly intact left lower extremity.  He has intact DP, PT pulses, intact motion of the toes and ankle.  He has intact distal capillary refill from the injury.  Will obtain radiographic imaging of the left foot to evaluate for fracture.  Radiographic imaging of the left  foot shows a questionable osseous lesion of the left tibia, no evidence of acute fracture in the left foot at the location of the injury.  I agree with radiologist interpretation.  Because of the questionable lesion on the tibia we obtained a dedicated tibia/fibula film.  Tibia/fibula film shows an expansile lytic lesion in the distal tibia metaphysis with multiple calcified internal septations.  They provided differential including aneurysmal bone cyst, eosinophilic granuloma, giant cell tumor, amongst others.  The recommendation was for further orthopedic evaluation.  I agree with their interpretation.  In context of  no visible fracture, but significant pain discussed with patient that injuries of the midfoot can sometimes involve occult fractures that do not present on initial radiographic imaging encouraged follow-up with orthopedics if pain does not improve despite 1 to 2 weeks of RICE, and pain control.  Will provide postop shoe, and crutches so that patient can stay off the foot for a day or 2, but encouraged weightbearing as tolerated.  Discussed with patient the recommendations for further orthopedic evaluation of the lesion in his tibia, and patient discharged in stable condition at this time. Final Clinical Impression(s) / ED Diagnoses Final diagnoses:  Contusion of left foot, initial encounter  Lytic bone lesions on xray    Rx / DC Orders ED Discharge Orders     None         [place] 07/18/21 2216    Pollyann Savoy, MD 07/18/21 2300

## 2022-09-12 IMAGING — DX DG FOOT COMPLETE 3+V*L*
3 series · 3 of 3 positions shown · non-contrast
Comparison: None.

CLINICAL DATA: blunt trauma, Tree limb fell on pts foot, pain and
swelling around entire foot

EXAM:
LEFT FOOT - COMPLETE 3+ VIEW

[foot ap]
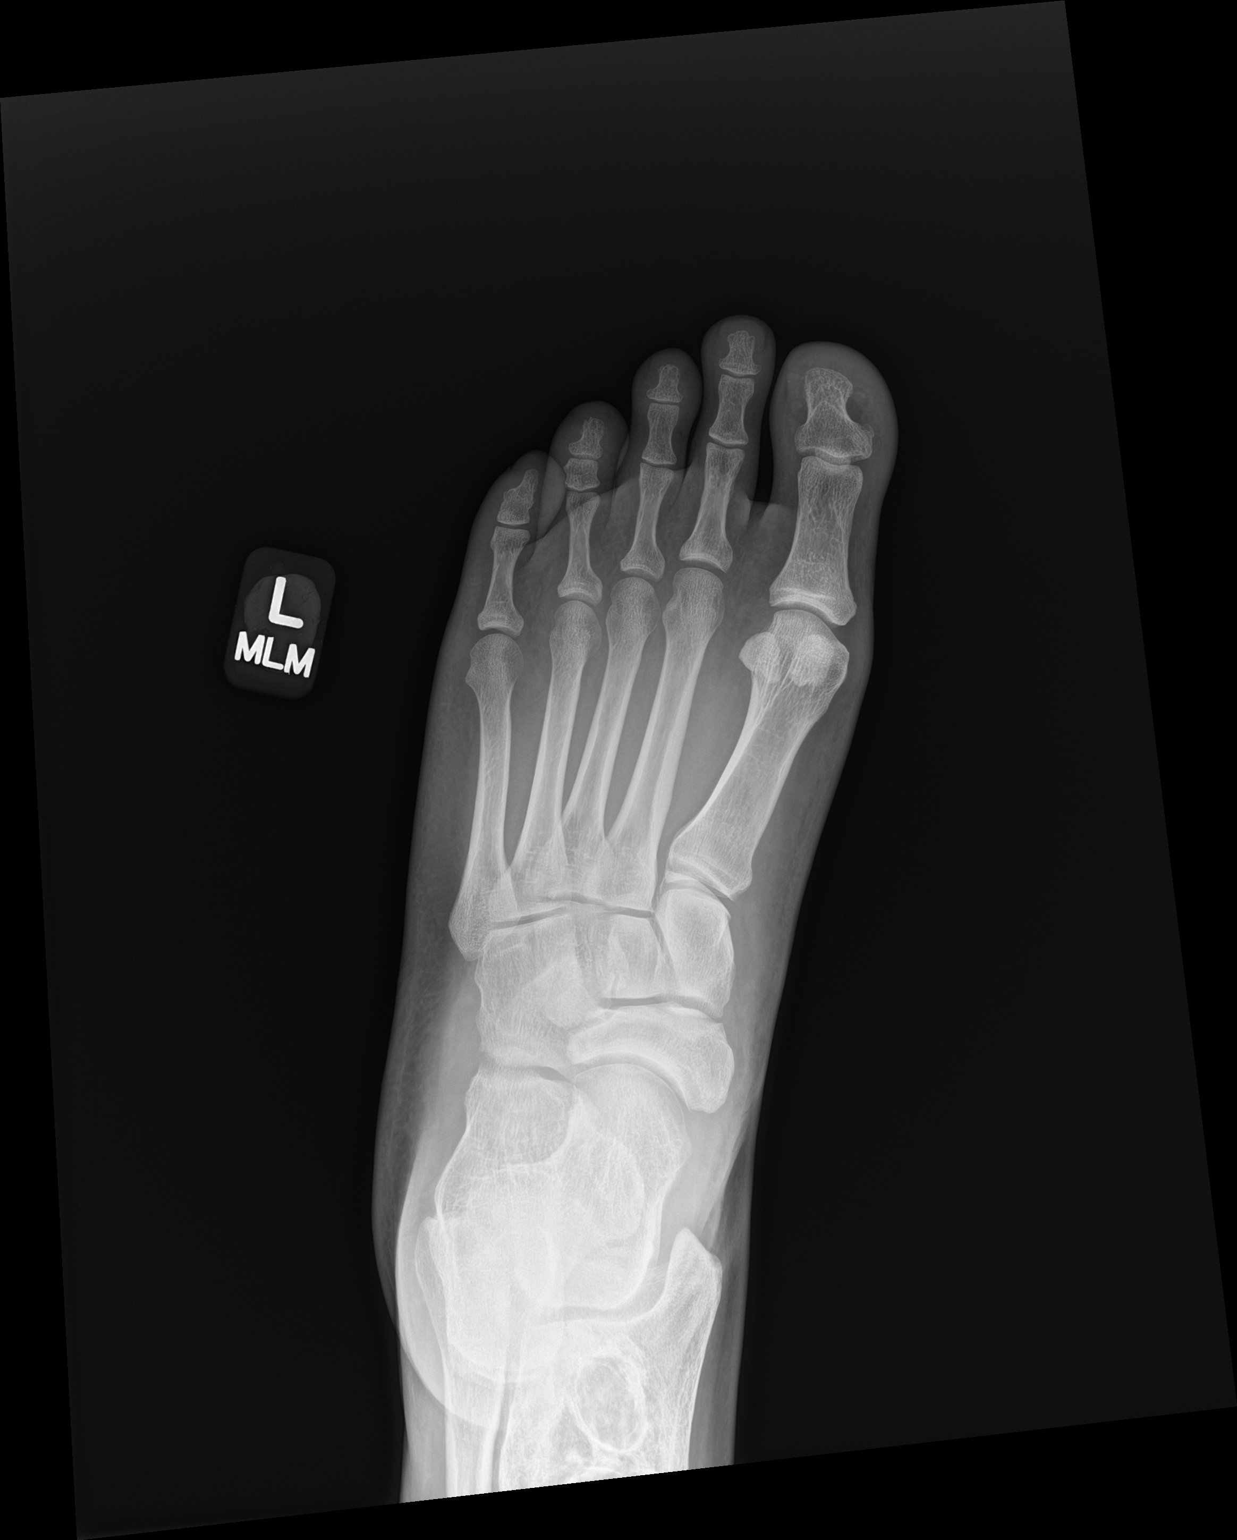

[foot obl]
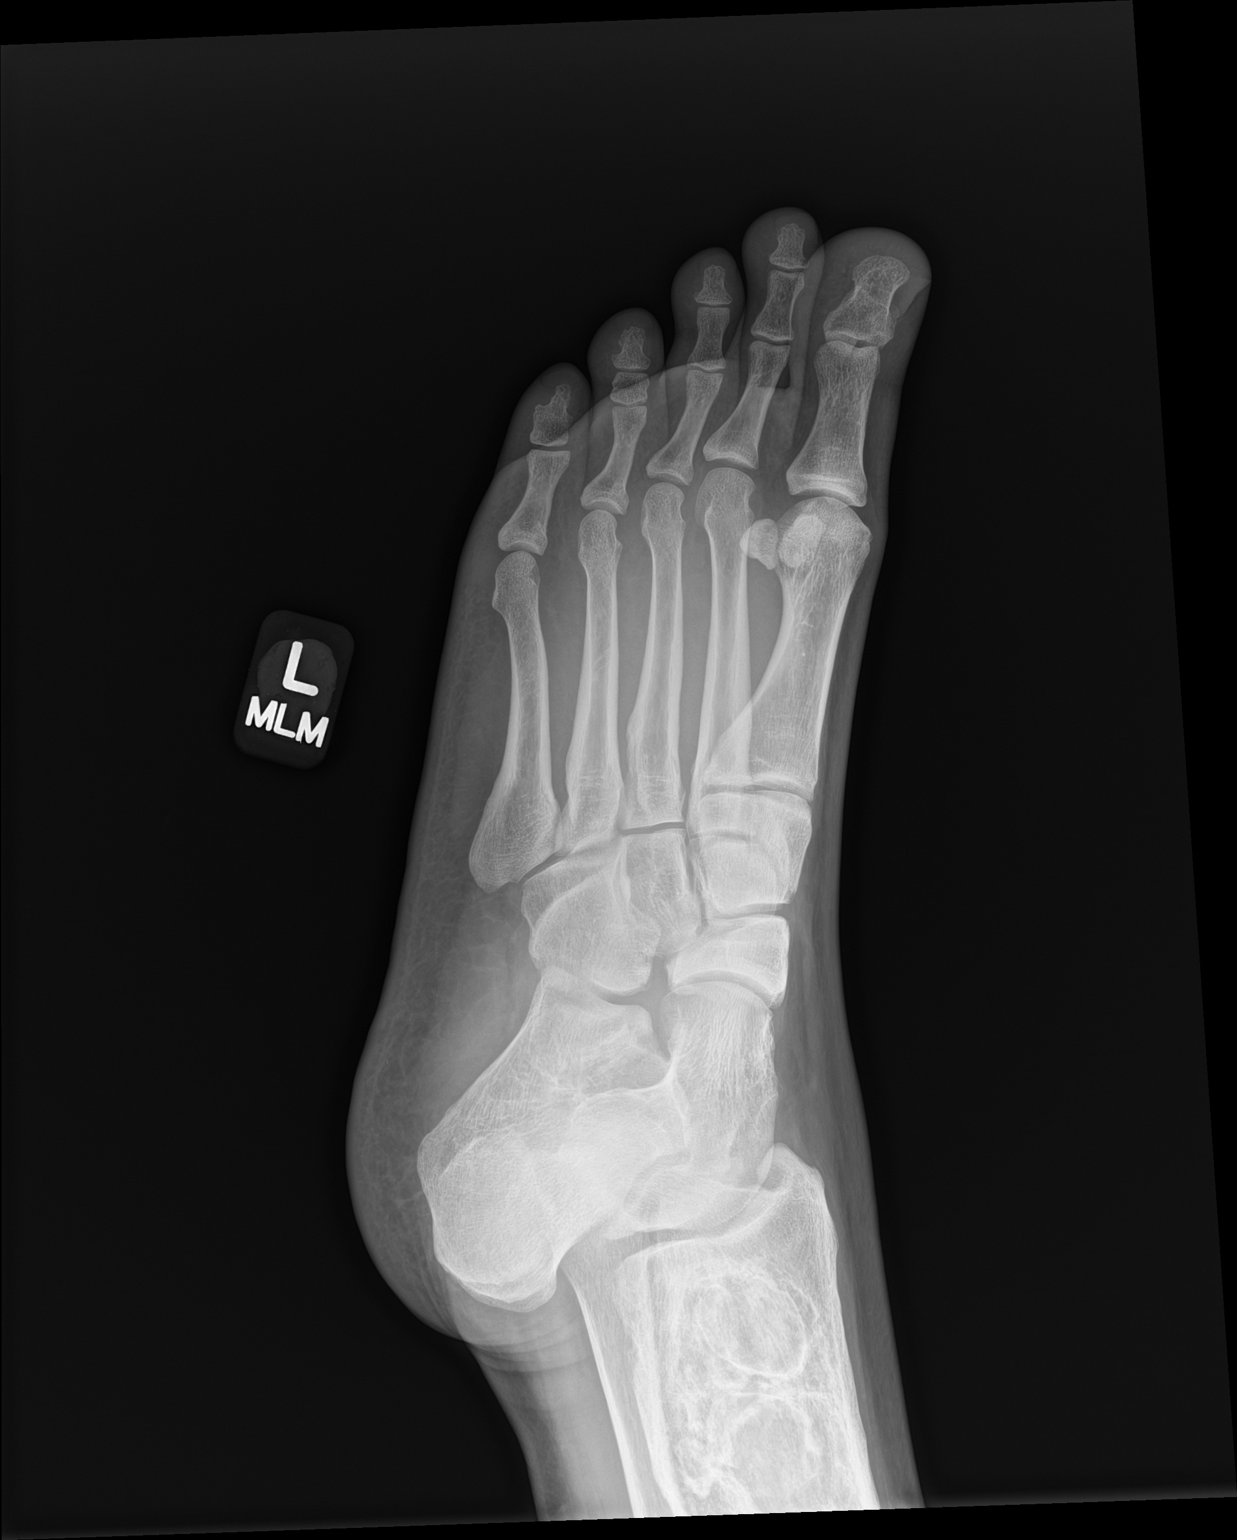

[foot lat]
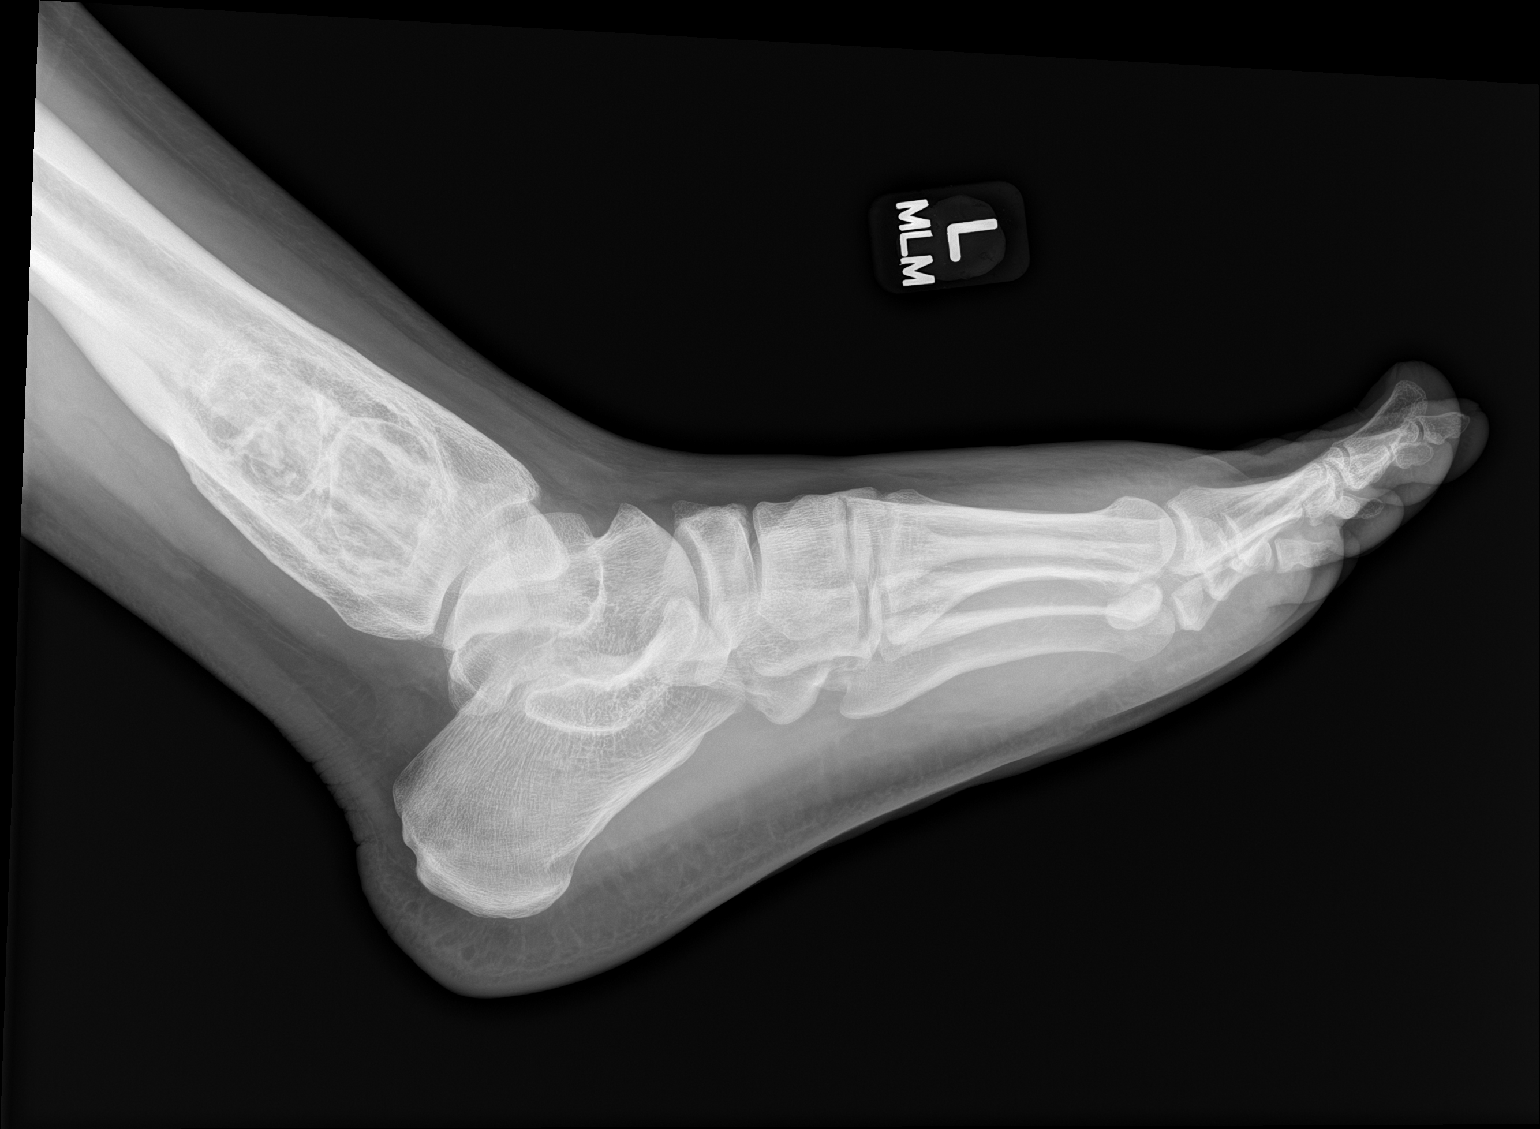

[3 of 3 positions shown; findings below may reference images not displayed]

FINDINGS: There is no evidence of fracture or dislocation. There is no
evidence of arthropathy or other focal bone abnormality.
Heterogeneous expansive lesion of the distal fibula. Mild
subcutaneus soft tissue edema of the dorsal forefoot.
IMPRESSION: 1. No acute displaced fracture or dislocation.
2. Heterogeneous expansive lesion of the distal fibula. Recommend
dedicated left tibial fibular radiograph.

## 2023-09-02 ENCOUNTER — Ambulatory Visit: Payer: Medicaid Other | Attending: Cardiology | Admitting: Cardiology

## 2023-09-03 ENCOUNTER — Encounter: Payer: Self-pay | Admitting: Cardiology

## 2023-09-29 ENCOUNTER — Emergency Department (HOSPITAL_BASED_OUTPATIENT_CLINIC_OR_DEPARTMENT_OTHER)
Admission: EM | Admit: 2023-09-29 | Discharge: 2023-09-29 | Discharge status: Against Medical Advice | Attending: Emergency Medicine | Admitting: Emergency Medicine

## 2023-09-29 ENCOUNTER — Other Ambulatory Visit: Payer: Self-pay

## 2023-09-29 DIAGNOSIS — M62830 Muscle spasm of back: Secondary | ICD-10-CM | POA: Insufficient documentation

## 2023-09-29 DIAGNOSIS — Z5321 Procedure and treatment not carried out due to patient leaving prior to being seen by health care provider: Secondary | ICD-10-CM | POA: Diagnosis not present

## 2023-09-29 NOTE — ED Triage Notes (Signed)
 Pt POV reporting upper back spasms, takes med for it daily but unaware of name. NAD noted at this time

## 2023-12-29 ENCOUNTER — Ambulatory Visit: Attending: Cardiology | Admitting: Cardiology

## 2023-12-30 ENCOUNTER — Encounter: Payer: Self-pay | Admitting: Cardiology

## 2023-12-31 NOTE — Progress Notes (Unsigned)
  Cardiology Office Note:  .   Date:  12/31/2023  ID:  Jeremy [name], DOB 06/09/1981, MRN [id number] PCP: Joaquin Mulberry, MD  [facility] Providers Cardiologist:  None { Click to update primary MD,subspecialty MD or APP then REFRESH:1}    No chief complaint on file.   Patient Profile: .     Jeremy [name] is a *** 43 y.o. male *** with a PMH notable for *** who presents here for *** at the request of Fulp, Cammie, MD.  {There is no content from the last Narrative History section.}      Jeremy [name] was last seen on ***  Subjective  Discussed the use of AI scribe software for clinical note transcription with the patient, who gave verbal consent to proceed.  History of Present Illness      Cardiovascular ROS: {roscv:310661}  ROS:  Review of Systems - {ros master:310782}    Objective    Studies Reviewed: Aaron Aas        ECHO: *** CATH: *** MONITOR: *** CT: ***  Risk Assessment/Calculations:   {Does this patient have ATRIAL FIBRILLATION?:[phone]} No BP recorded.  {Refresh Note OR Click here to enter BP  :1}***         Physical Exam:   VS:  There were no vitals taken for this visit.   Wt Readings from Last 3 Encounters:  09/29/23 260 lb (117.9 kg)  01/05/21 260 lb (117.9 kg)  09/16/19 286 lb 12.8 oz (130.1 kg)    GEN: Well nourished, well developed in no acute distress; *** NECK: No JVD; No carotid bruits CARDIAC: Normal S1, S2; RRR, no murmurs, rubs, gallops RESPIRATORY:  Clear to auscultation without rales, wheezing or rhonchi ; nonlabored, good air movement. ABDOMEN: Soft, non-tender, non-distended EXTREMITIES:  No edema; No deformity      ASSESSMENT AND PLAN: .    Problem List Items Addressed This Visit   None   Assessment and Plan Assessment & Plan        {Are you ordering a CV Procedure (e.g. stress test, cath, DCCV, TEE, etc)?   Press F2        :[id number]}   Follow-Up: No follow-ups on file.  Total time  spent: *** min spent with patient + *** min spent charting = *** min    Signed, Arleen Lacer, MD, MS Randene Bustard, M.D., M.S. Interventional Chartered certified accountant  Pager # [phone]

## 2024-01-07 ENCOUNTER — Other Ambulatory Visit: Payer: Self-pay

## 2024-01-07 ENCOUNTER — Emergency Department (HOSPITAL_BASED_OUTPATIENT_CLINIC_OR_DEPARTMENT_OTHER)

## 2024-01-07 ENCOUNTER — Emergency Department (HOSPITAL_BASED_OUTPATIENT_CLINIC_OR_DEPARTMENT_OTHER): Admitting: Radiology

## 2024-01-07 ENCOUNTER — Inpatient Hospital Stay (HOSPITAL_BASED_OUTPATIENT_CLINIC_OR_DEPARTMENT_OTHER)
Admission: EM | Admit: 2024-01-07 | Discharge: 2024-01-14 | DRG: 219 | Discharge status: Against Medical Advice | Attending: Thoracic Surgery (Cardiothoracic Vascular Surgery) | Admitting: Thoracic Surgery (Cardiothoracic Vascular Surgery)

## 2024-01-07 DIAGNOSIS — K5730 Diverticulosis of large intestine without perforation or abscess without bleeding: Secondary | ICD-10-CM | POA: Diagnosis not present

## 2024-01-07 DIAGNOSIS — I5023 Acute on chronic systolic (congestive) heart failure: Secondary | ICD-10-CM | POA: Diagnosis not present

## 2024-01-07 DIAGNOSIS — I517 Cardiomegaly: Secondary | ICD-10-CM | POA: Diagnosis not present

## 2024-01-07 DIAGNOSIS — I169 Hypertensive crisis, unspecified: Secondary | ICD-10-CM | POA: Diagnosis present

## 2024-01-07 DIAGNOSIS — R443 Hallucinations, unspecified: Secondary | ICD-10-CM | POA: Diagnosis not present

## 2024-01-07 DIAGNOSIS — R059 Cough, unspecified: Secondary | ICD-10-CM | POA: Diagnosis not present

## 2024-01-07 DIAGNOSIS — E870 Hyperosmolality and hypernatremia: Secondary | ICD-10-CM | POA: Diagnosis present

## 2024-01-07 DIAGNOSIS — J9811 Atelectasis: Secondary | ICD-10-CM | POA: Diagnosis not present

## 2024-01-07 DIAGNOSIS — I3139 Other pericardial effusion (noninflammatory): Secondary | ICD-10-CM | POA: Diagnosis present

## 2024-01-07 DIAGNOSIS — Z880 Allergy status to penicillin: Secondary | ICD-10-CM

## 2024-01-07 DIAGNOSIS — D62 Acute posthemorrhagic anemia: Secondary | ICD-10-CM | POA: Diagnosis not present

## 2024-01-07 DIAGNOSIS — D696 Thrombocytopenia, unspecified: Secondary | ICD-10-CM | POA: Diagnosis not present

## 2024-01-07 DIAGNOSIS — D72829 Elevated white blood cell count, unspecified: Secondary | ICD-10-CM | POA: Diagnosis not present

## 2024-01-07 DIAGNOSIS — N170 Acute kidney failure with tubular necrosis: Secondary | ICD-10-CM | POA: Diagnosis not present

## 2024-01-07 DIAGNOSIS — I110 Hypertensive heart disease with heart failure: Secondary | ICD-10-CM | POA: Diagnosis present

## 2024-01-07 DIAGNOSIS — E66811 Obesity, class 1: Secondary | ICD-10-CM | POA: Diagnosis present

## 2024-01-07 DIAGNOSIS — E871 Hypo-osmolality and hyponatremia: Secondary | ICD-10-CM | POA: Diagnosis not present

## 2024-01-07 DIAGNOSIS — I713 Abdominal aortic aneurysm, ruptured, unspecified: Secondary | ICD-10-CM | POA: Diagnosis present

## 2024-01-07 DIAGNOSIS — Z79899 Other long term (current) drug therapy: Secondary | ICD-10-CM

## 2024-01-07 DIAGNOSIS — D689 Coagulation defect, unspecified: Secondary | ICD-10-CM | POA: Diagnosis present

## 2024-01-07 DIAGNOSIS — I97618 Postprocedural hemorrhage and hematoma of a circulatory system organ or structure following other circulatory system procedure: Secondary | ICD-10-CM | POA: Diagnosis not present

## 2024-01-07 DIAGNOSIS — I7772 Dissection of iliac artery: Secondary | ICD-10-CM | POA: Diagnosis not present

## 2024-01-07 DIAGNOSIS — I509 Heart failure, unspecified: Secondary | ICD-10-CM | POA: Diagnosis not present

## 2024-01-07 DIAGNOSIS — E876 Hypokalemia: Secondary | ICD-10-CM | POA: Diagnosis not present

## 2024-01-07 DIAGNOSIS — I7410 Embolism and thrombosis of unspecified parts of aorta: Principal | ICD-10-CM | POA: Diagnosis present

## 2024-01-07 DIAGNOSIS — F1910 Other psychoactive substance abuse, uncomplicated: Secondary | ICD-10-CM | POA: Diagnosis present

## 2024-01-07 DIAGNOSIS — I7101 Dissection of ascending aorta: Secondary | ICD-10-CM | POA: Diagnosis present

## 2024-01-07 DIAGNOSIS — F1410 Cocaine abuse, uncomplicated: Secondary | ICD-10-CM | POA: Diagnosis present

## 2024-01-07 DIAGNOSIS — I7121 Aneurysm of the ascending aorta, without rupture: Secondary | ICD-10-CM | POA: Diagnosis not present

## 2024-01-07 DIAGNOSIS — F17210 Nicotine dependence, cigarettes, uncomplicated: Secondary | ICD-10-CM | POA: Diagnosis not present

## 2024-01-07 DIAGNOSIS — I9762 Postprocedural hemorrhage of a circulatory system organ or structure following other procedure: Secondary | ICD-10-CM | POA: Diagnosis not present

## 2024-01-07 DIAGNOSIS — I351 Nonrheumatic aortic (valve) insufficiency: Secondary | ICD-10-CM | POA: Diagnosis present

## 2024-01-07 DIAGNOSIS — J9601 Acute respiratory failure with hypoxia: Secondary | ICD-10-CM | POA: Diagnosis not present

## 2024-01-07 DIAGNOSIS — E278 Other specified disorders of adrenal gland: Secondary | ICD-10-CM | POA: Diagnosis not present

## 2024-01-07 DIAGNOSIS — R579 Shock, unspecified: Secondary | ICD-10-CM | POA: Diagnosis not present

## 2024-01-07 DIAGNOSIS — Z95828 Presence of other vascular implants and grafts: Secondary | ICD-10-CM

## 2024-01-07 DIAGNOSIS — Z6836 Body mass index (BMI) 36.0-36.9, adult: Secondary | ICD-10-CM

## 2024-01-07 DIAGNOSIS — J9602 Acute respiratory failure with hypercapnia: Secondary | ICD-10-CM | POA: Diagnosis not present

## 2024-01-07 DIAGNOSIS — I428 Other cardiomyopathies: Secondary | ICD-10-CM | POA: Diagnosis present

## 2024-01-07 DIAGNOSIS — Q2543 Congenital aneurysm of aorta: Secondary | ICD-10-CM | POA: Diagnosis not present

## 2024-01-07 DIAGNOSIS — I959 Hypotension, unspecified: Secondary | ICD-10-CM | POA: Diagnosis not present

## 2024-01-07 DIAGNOSIS — R000 Tachycardia, unspecified: Secondary | ICD-10-CM | POA: Diagnosis not present

## 2024-01-07 DIAGNOSIS — N179 Acute kidney failure, unspecified: Secondary | ICD-10-CM | POA: Diagnosis not present

## 2024-01-07 DIAGNOSIS — R570 Cardiogenic shock: Secondary | ICD-10-CM | POA: Diagnosis not present

## 2024-01-07 DIAGNOSIS — R0602 Shortness of breath: Secondary | ICD-10-CM | POA: Diagnosis not present

## 2024-01-07 DIAGNOSIS — L8990 Pressure ulcer of unspecified site, unspecified stage: Secondary | ICD-10-CM | POA: Insufficient documentation

## 2024-01-07 DIAGNOSIS — Y832 Surgical operation with anastomosis, bypass or graft as the cause of abnormal reaction of the patient, or of later complication, without mention of misadventure at the time of the procedure: Secondary | ICD-10-CM | POA: Diagnosis not present

## 2024-01-07 DIAGNOSIS — I71019 Dissection of thoracic aorta, unspecified: Secondary | ICD-10-CM | POA: Diagnosis present

## 2024-01-07 DIAGNOSIS — R160 Hepatomegaly, not elsewhere classified: Secondary | ICD-10-CM | POA: Diagnosis not present

## 2024-01-07 DIAGNOSIS — Z8249 Family history of ischemic heart disease and other diseases of the circulatory system: Secondary | ICD-10-CM

## 2024-01-07 DIAGNOSIS — R079 Chest pain, unspecified: Secondary | ICD-10-CM | POA: Diagnosis not present

## 2024-01-07 DIAGNOSIS — F17290 Nicotine dependence, other tobacco product, uncomplicated: Secondary | ICD-10-CM | POA: Diagnosis present

## 2024-01-07 DIAGNOSIS — Z5329 Procedure and treatment not carried out because of patient's decision for other reasons: Secondary | ICD-10-CM | POA: Diagnosis present

## 2024-01-07 DIAGNOSIS — Z7401 Bed confinement status: Secondary | ICD-10-CM | POA: Diagnosis not present

## 2024-01-07 DIAGNOSIS — I480 Paroxysmal atrial fibrillation: Secondary | ICD-10-CM | POA: Diagnosis present

## 2024-01-07 LAB — URINALYSIS, ROUTINE W REFLEX MICROSCOPIC
Bacteria, UA: NONE SEEN
Bilirubin Urine: NEGATIVE
Glucose, UA: NEGATIVE mg/dL
Ketones, ur: 5 mg/dL — AB
Leukocytes,Ua: NEGATIVE
Nitrite: NEGATIVE
Protein, ur: NEGATIVE mg/dL
Specific Gravity, Urine: 1.029 (ref 1.005–1.030)
pH: 6 (ref 5.0–8.0)

## 2024-01-07 LAB — BASIC METABOLIC PANEL WITH GFR
Anion gap: 12 (ref 5–15)
BUN: 15 mg/dL (ref 6–20)
CO2: 25 mmol/L (ref 22–32)
Calcium: 9.7 mg/dL (ref 8.9–10.3)
Chloride: 104 mmol/L (ref 98–111)
Creatinine, Ser: 1.03 mg/dL (ref 0.61–1.24)
GFR, Estimated: >60 mL/min
Glucose, Bld: 93 mg/dL (ref 70–99)
Potassium: 4.3 mmol/L (ref 3.5–5.1)
Sodium: 141 mmol/L (ref 135–145)

## 2024-01-07 LAB — PROTIME-INR
INR: 1 (ref 0.8–1.2)
Prothrombin Time: 13.2 s (ref 11.4–15.2)

## 2024-01-07 LAB — URINE DRUG SCREEN
Amphetamines: NOT DETECTED
Barbiturates: NOT DETECTED
Benzodiazepines: NOT DETECTED
Cocaine: DETECTED — AB
Fentanyl: NOT DETECTED
Methadone Scn, Ur: NOT DETECTED
Opiates: NOT DETECTED
Tetrahydrocannabinol: DETECTED — AB

## 2024-01-07 LAB — HEMOGLOBIN A1C
Hgb A1c MFr Bld: 4.7 % — ABNORMAL LOW (ref 4.8–5.6)
Mean Plasma Glucose: 88.19 mg/dL

## 2024-01-07 LAB — CBC
HCT: 42.9 % (ref 39.0–52.0)
Hemoglobin: 15 g/dL (ref 13.0–17.0)
MCH: 30.3 pg (ref 26.0–34.0)
MCHC: 35 g/dL (ref 30.0–36.0)
MCV: 86.7 fL (ref 80.0–100.0)
Platelets: 271 10*3/uL (ref 150–400)
RBC: 4.95 MIL/uL (ref 4.22–5.81)
RDW: 15 % (ref 11.5–15.5)
WBC: 8.5 10*3/uL (ref 4.0–10.5)
nRBC: 0 % (ref 0.0–0.2)

## 2024-01-07 LAB — TROPONIN T, HIGH SENSITIVITY
Troponin T High Sensitivity: 19 ng/L — ABNORMAL HIGH
Troponin T High Sensitivity: 20 ng/L — ABNORMAL HIGH (ref <19)

## 2024-01-07 LAB — APTT: aPTT: 30 s (ref 24–36)

## 2024-01-07 LAB — ABO/RH: ABO/RH(D): O POS

## 2024-01-07 MED ORDER — CHLORHEXIDINE GLUCONATE CLOTH 2 % EX PADS
6.0000 | MEDICATED_PAD | Freq: Once | CUTANEOUS | Status: AC
  Administered 2024-01-08: 6 via TOPICAL

## 2024-01-07 MED ORDER — PLASMA-LYTE A IV SOLN
INTRAVENOUS | Status: DC
  Filled 2024-01-07: qty 2.5

## 2024-01-07 MED ORDER — MANNITOL 20 % IV SOLN
INTRAVENOUS | Status: DC
  Filled 2024-01-07 (×2): qty 13

## 2024-01-07 MED ORDER — POTASSIUM CHLORIDE 2 MEQ/ML IV SOLN
80.0000 meq | INTRAVENOUS | Status: DC
  Filled 2024-01-07: qty 40

## 2024-01-07 MED ORDER — ACETAMINOPHEN 160 MG/5ML PO SOLN: 650.0000 mg | Freq: Four times a day (QID) | ORAL | Status: DC | PRN

## 2024-01-07 MED ORDER — CHLORHEXIDINE GLUCONATE 0.12 % MT SOLN
15.0000 mL | Freq: Once | OROMUCOSAL | Status: AC
  Administered 2024-01-08: 15 mL via OROMUCOSAL
  Filled 2024-01-07 (×2): qty 15

## 2024-01-07 MED ORDER — ESMOLOL HCL-SODIUM CHLORIDE 2000 MG/100ML IV SOLN
25.0000 ug/kg/min | INTRAVENOUS | Status: DC
  Administered 2024-01-07 (×4): 300 ug/kg/min via INTRAVENOUS
  Administered 2024-01-07: 100 ug/kg/min via INTRAVENOUS
  Administered 2024-01-07 – 2024-01-08 (×3): 300 ug/kg/min via INTRAVENOUS
  Administered 2024-01-08: 100 ug/kg/min via INTRAVENOUS
  Administered 2024-01-08 (×4): 300 ug/kg/min via INTRAVENOUS
  Filled 2024-01-07: qty 200
  Filled 2024-01-07: qty 100
  Filled 2024-01-07: qty 200
  Filled 2024-01-07: qty 100
  Filled 2024-01-07: qty 200
  Filled 2024-01-07 (×2): qty 100
  Filled 2024-01-07: qty 200

## 2024-01-07 MED ORDER — CHLORHEXIDINE GLUCONATE CLOTH 2 % EX PADS
6.0000 | MEDICATED_PAD | Freq: Once | CUTANEOUS | Status: AC
  Administered 2024-01-07: 6 via TOPICAL

## 2024-01-07 MED ORDER — DIPHENHYDRAMINE HCL 50 MG/ML IJ SOLN
12.5000 mg | Freq: Once | INTRAMUSCULAR | Status: AC
  Administered 2024-01-07: 12.5 mg via INTRAVENOUS
  Filled 2024-01-07: qty 1

## 2024-01-07 MED ORDER — PHENYLEPHRINE HCL-NACL 20-0.9 MG/250ML-% IV SOLN
30.0000 ug/min | INTRAVENOUS | Status: AC
  Administered 2024-01-08: 25 ug/min via INTRAVENOUS
  Filled 2024-01-07: qty 250

## 2024-01-07 MED ORDER — INSULIN REGULAR(HUMAN) IN NACL 100-0.9 UT/100ML-% IV SOLN
INTRAVENOUS | Status: AC
  Administered 2024-01-08: .7 [IU]/h via INTRAVENOUS
  Filled 2024-01-07: qty 100

## 2024-01-07 MED ORDER — FENTANYL CITRATE PF 50 MCG/ML IJ SOSY
50.0000 ug | PREFILLED_SYRINGE | Freq: Once | INTRAMUSCULAR | Status: AC
  Administered 2024-01-07: 50 ug via INTRAVENOUS
  Filled 2024-01-07: qty 1

## 2024-01-07 MED ORDER — CEFAZOLIN SODIUM-DEXTROSE 2-4 GM/100ML-% IV SOLN
2.0000 g | INTRAVENOUS | Status: AC
  Administered 2024-01-08: 2 g via INTRAVENOUS
  Filled 2024-01-07: qty 100

## 2024-01-07 MED ORDER — IOHEXOL 350 MG/ML SOLN
100.0000 mL | Freq: Once | INTRAVENOUS | Status: AC | PRN
  Administered 2024-01-07: 100 mL via INTRAVENOUS

## 2024-01-07 MED ORDER — MILRINONE LACTATE IN DEXTROSE 20-5 MG/100ML-% IV SOLN
0.3000 ug/kg/min | INTRAVENOUS | Status: AC
  Administered 2024-01-08: .125 ug/kg/min via INTRAVENOUS
  Filled 2024-01-07 (×2): qty 100

## 2024-01-07 MED ORDER — DEXMEDETOMIDINE HCL IN NACL 400 MCG/100ML IV SOLN
0.1000 ug/kg/h | INTRAVENOUS | Status: AC
  Administered 2024-01-08: .4 ug/kg/h via INTRAVENOUS
  Filled 2024-01-07: qty 100

## 2024-01-07 MED ORDER — ALPRAZOLAM 0.25 MG PO TABS: 0.2500 mg | ORAL_TABLET | ORAL | Status: DC | PRN

## 2024-01-07 MED ORDER — PROCHLORPERAZINE EDISYLATE 10 MG/2ML IJ SOLN
5.0000 mg | Freq: Once | INTRAMUSCULAR | Status: AC
  Administered 2024-01-07: 5 mg via INTRAVENOUS
  Filled 2024-01-07: qty 2

## 2024-01-07 MED ORDER — TIZANIDINE HCL 4 MG PO TABS: 4.0000 mg | ORAL_TABLET | Freq: Four times a day (QID) | ORAL | Status: DC | PRN

## 2024-01-07 MED ORDER — VANCOMYCIN HCL 1.5 G IV SOLR
1500.0000 mg | INTRAVENOUS | Status: AC
  Administered 2024-01-08: 1500 mg via INTRAVENOUS
  Filled 2024-01-07 (×2): qty 30

## 2024-01-07 MED ORDER — NITROGLYCERIN IN D5W 200-5 MCG/ML-% IV SOLN
2.0000 ug/min | INTRAVENOUS | Status: DC
  Filled 2024-01-07: qty 250

## 2024-01-07 MED ORDER — HEPARIN 30,000 UNITS/1000 ML (OHS) CELLSAVER SOLUTION
Status: DC
  Filled 2024-01-07: qty 1000

## 2024-01-07 MED ORDER — NOREPINEPHRINE 4 MG/250ML-% IV SOLN
0.0000 ug/min | INTRAVENOUS | Status: AC
  Administered 2024-01-08: 5 ug/min via INTRAVENOUS
  Filled 2024-01-07: qty 250

## 2024-01-07 MED ORDER — TRAMADOL HCL 50 MG PO TABS
50.0000 mg | ORAL_TABLET | Freq: Two times a day (BID) | ORAL | Status: DC | PRN
  Administered 2024-01-07: 50 mg via ORAL
  Filled 2024-01-07: qty 1

## 2024-01-07 MED ORDER — AMLODIPINE BESYLATE 5 MG PO TABS
5.0000 mg | ORAL_TABLET | Freq: Every day | ORAL | Status: DC
  Administered 2024-01-07: 5 mg via ORAL
  Filled 2024-01-07: qty 1

## 2024-01-07 MED ORDER — TRANEXAMIC ACID (OHS) BOLUS VIA INFUSION
15.0000 mg/kg | INTRAVENOUS | Status: AC
  Administered 2024-01-08: 1768.5 mg via INTRAVENOUS
  Filled 2024-01-07: qty 1769

## 2024-01-07 MED ORDER — TRANEXAMIC ACID 1000 MG/10ML IV SOLN
1.5000 mg/kg/h | INTRAVENOUS | Status: AC
  Administered 2024-01-08: 1.5 mg/kg/h via INTRAVENOUS
  Filled 2024-01-07 (×2): qty 25

## 2024-01-07 MED ORDER — EPINEPHRINE HCL 5 MG/250ML IV SOLN IN NS
0.0000 ug/min | INTRAVENOUS | Status: AC
  Administered 2024-01-08: 5 ug/min via INTRAVENOUS
  Filled 2024-01-07: qty 250

## 2024-01-07 MED ORDER — TRANEXAMIC ACID (OHS) PUMP PRIME SOLUTION
2.0000 mg/kg | INTRAVENOUS | Status: DC
  Filled 2024-01-07: qty 2.36

## 2024-01-07 MED ORDER — BISACODYL 5 MG PO TBEC
5.0000 mg | DELAYED_RELEASE_TABLET | Freq: Once | ORAL | Status: AC
  Administered 2024-01-07: 5 mg via ORAL
  Filled 2024-01-07: qty 1

## 2024-01-07 MED ORDER — ESMOLOL HCL-SODIUM CHLORIDE 2000 MG/100ML IV SOLN
INTRAVENOUS | Status: AC
  Administered 2024-01-07: 24.515 ug/kg/min via INTRAVENOUS
  Filled 2024-01-07: qty 100

## 2024-01-07 NOTE — ED Provider Notes (Signed)
 [facility] AT [facility] Provider Note   CSN: [id number] Arrival date & time: 01/07/24  1335     Patient presents with: Chest Pain   Jeremy [name] is a 43 y.o. male.   Patient states that he felt suddenly anxious short of breath chest pain just prior to coming here.  He took Xanax aspirin.  He has a history of congestive heart failure/nonischemic cardiomyopathy and hypertension.  Denies any recent surgery or travel.   Chest Pain Pain location:  Substernal area Pain quality: aching   Pain radiates to:  Does not radiate Pain severity:  Mild Onset quality:  Gradual Duration:  2 hours Progression:  Improving Chronicity:  New Context: stress (feeling anxious, took xanax and aspirin before coming. Been stressed about money. No CP with exertion or otherwise here recently. No recent surgery, travel or cancer hx.)   Worsened by:  Nothing Associated symptoms: anxiety and shortness of breath   Associated symptoms: no abdominal pain, no AICD problem, no altered mental status, no anorexia, no back pain, no claudication, no cough, no diaphoresis, no dizziness, no dysphagia, no fatigue, no fever, no headache, no heartburn, no lower extremity edema, no nausea, no near-syncope, no numbness, no orthopnea, no palpitations, no PND, no syncope, no vomiting and no weakness   Risk factors: hypertension (CHF/Nonischemic CM)        Prior to Admission medications   Medication Sig Start Date End Date Taking? Authorizing Provider  amLODipine  (NORVASC ) 5 MG tablet Take 1 tablet (5 mg total) by mouth daily. 09/16/19   Lorren Greig JINNY, NP  carvedilol  (COREG ) 12.5 MG tablet Take 1 tablet (12.5 mg total) by mouth 2 (two) times daily with a meal. 09/16/19   Lorren Greig JINNY, NP  lisinopril  (ZESTRIL ) 20 MG tablet Take 1 tablet (20 mg total) by mouth daily. 09/16/19   Lorren Greig JINNY, NP  tiZANidine  (ZANAFLEX ) 4 MG tablet Take 1 tablet (4 mg total) by mouth every 6 (six) hours as  needed for muscle spasms. 01/05/21   Mesner, Selinda, MD  traMADol  (ULTRAM ) 50 MG tablet Take 1 tablet (50 mg total) by mouth every 12 (twelve) hours as needed. 12/24/19   Newlin, Enobong, MD    Allergies: Ampicillin and Penicillins    Review of Systems  Constitutional:  Negative for diaphoresis, fatigue and fever.  HENT:  Negative for trouble swallowing.   Respiratory:  Positive for shortness of breath. Negative for cough.   Cardiovascular:  Positive for chest pain. Negative for palpitations, orthopnea, claudication, syncope, PND and near-syncope.  Gastrointestinal:  Negative for abdominal pain, anorexia, heartburn, nausea and vomiting.  Musculoskeletal:  Negative for back pain.  Neurological:  Negative for dizziness, weakness, numbness and headaches.    Updated Vital Signs BP (!) 145/62 (BP Location: Right Arm)   Pulse 69   Temp 98.3 F (36.8 C) (Oral)   Resp 18   Wt 117.9 kg   SpO2 (!) 89%   BMI 35.26 kg/m   Physical Exam Vitals and nursing note reviewed.  Constitutional:      Appearance: He is well-developed. He is not ill-appearing.     Comments: He does appear uncomfortable  HENT:     Head: Normocephalic and atraumatic.   Eyes:     Extraocular Movements: Extraocular movements intact.     Conjunctiva/sclera: Conjunctivae normal.     Pupils: Pupils are equal, round, and reactive to light.    Cardiovascular:     Rate and Rhythm: Normal rate and  regular rhythm.     Pulses:          Radial pulses are 2+ on the right side and 2+ on the left side.     Heart sounds: Normal heart sounds. No murmur heard. Pulmonary:     Effort: Pulmonary effort is normal. No respiratory distress.     Breath sounds: Normal breath sounds. No decreased breath sounds or wheezing.  Abdominal:     Palpations: Abdomen is soft.     Tenderness: There is no abdominal tenderness.   Musculoskeletal:        General: No swelling. Normal range of motion.     Cervical back: Normal range of motion and  neck supple.     Right lower leg: No edema.     Left lower leg: No edema.   Skin:    General: Skin is warm and dry.     Capillary Refill: Capillary refill takes less than 2 seconds.   Neurological:     General: No focal deficit present.     Mental Status: He is alert and oriented to person, place, and time.     Cranial Nerves: No cranial nerve deficit.     Motor: No weakness.   Psychiatric:        Mood and Affect: Mood is anxious.     (all labs ordered are listed, but only abnormal results are displayed) Labs Reviewed  TROPONIN T, HIGH SENSITIVITY - Abnormal; Notable for the following components:      Result Value   Troponin T High Sensitivity 19 (*)    All other components within normal limits  TROPONIN T, HIGH SENSITIVITY - Abnormal; Notable for the following components:   Troponin T High Sensitivity 20 (*)    All other components within normal limits  BASIC METABOLIC PANEL WITH GFR  CBC  URINE DRUG SCREEN    EKG: EKG Interpretation Date/Time:  Wednesday January 07 2024 13:41:10 EDT Ventricular Rate:  69 PR Interval:  174 QRS Duration:  120 QT Interval:  384 QTC Calculation: 411 R Axis:   91  Text Interpretation: Normal sinus rhythm Possible Left atrial enlargement Rightward axis Biventricular hypertrophy with QRS widening ST & T wave abnormality, consider inferior ischemia Abnormal ECG When compared with ECG of 05-Jan-2021 02:29, PREVIOUS ECG IS PRESENT when compared to prior, overall similar ST and T wave abnormalities No STEMI Confirmed by Ginger Barefoot ([id number]) on 01/07/2024 1:44:16 PM  Radiology: CT Angio Chest/Abd/Pel for Dissection W and/or Wo Contrast Result Date: 01/07/2024 CLINICAL DATA:  Acute aortic syndrome suspected. EXAM: CT ANGIOGRAPHY CHEST, ABDOMEN AND PELVIS TECHNIQUE: Noncontrast chest CT was performed. Multidetector CT imaging through the chest, abdomen and pelvis was performed using the standard protocol during bolus administration of intravenous  contrast. Multiplanar reconstructed images and MIPs were obtained and reviewed to evaluate the vascular anatomy. RADIATION DOSE REDUCTION: This exam was performed according to the departmental dose-optimization program which includes automated exposure control, adjustment of the mA and/or kV according to patient size and/or use of iterative reconstruction technique. CONTRAST:  OMNIPAQUE  IOHEXOL  350 MG/ML SOLN COMPARISON:  01/07/2024 and 01/05/2021. FINDINGS: CTA CHEST FINDINGS Cardiovascular: Motion artifact limits evaluation. There is intramural hematoma involving the ascending aorta and aortic arch measuring up to 2 cm in thickness. There is ascending aortic aneurysm measuring 6.7 x 6.6 cm. Aortic arch and descending aorta appear normal in size. Origin of the great vessels appears within normal limits. The heart appears enlarged, particularly the left ventricle. There is no  pericardial effusion. Mediastinum/Nodes: Posterior right thyroid nodule present measuring 18 mm. No enlarged lymph nodes identified. Esophagus within normal limits. Lungs/Pleura: Mild emphysema. There is a band of atelectasis in the left lower lobe. The lungs are otherwise clear. No pleural effusion or pneumothorax. Musculoskeletal: No chest wall abnormality. No acute or significant osseous findings. Review of the MIP images confirms the above findings. CTA ABDOMEN AND PELVIS FINDINGS VASCULAR Aorta: Normal caliber aorta without aneurysm, dissection, vasculitis or significant stenosis. There are minimal calcified plaques in the aorta. Celiac: Patent without evidence of aneurysm, dissection, vasculitis or significant stenosis. SMA: Patent without evidence of aneurysm, dissection, vasculitis or significant stenosis. Renals: Both renal arteries are patent without evidence of aneurysm, dissection, vasculitis, fibromuscular dysplasia or significant stenosis. Accessory right renal artery present. IMA: Patent without evidence of aneurysm,  dissection, vasculitis or significant stenosis. Inflow: There is a small focal dissection in the left common iliac artery. No evidence for aneurysm. External and internal iliac arteries are within normal limits on the left. Right common iliac, external iliac and internal iliac arteries are within normal limits. Veins: No obvious venous abnormality within the limitations of this arterial phase study. Review of the MIP images confirms the above findings. NON-VASCULAR Hepatobiliary: Liver is mildly enlarged. No focal liver abnormality is seen. No gallstones, gallbladder wall thickening, or biliary dilatation. Pancreas: Unremarkable. No pancreatic ductal dilatation or surrounding inflammatory changes. Spleen: Normal in size without focal abnormality. Adrenals/Urinary Tract: There is an indeterminate left adrenal nodule measuring 2 0.4 x 2.0 cm. Right adrenal gland is within normal limits. Bilateral kidneys appear within normal limits. The bladder is within normal limits. Stomach/Bowel: Stomach is within normal limits. Appendix appears normal. No evidence of bowel wall thickening, distention, or inflammatory changes. Sigmoid and descending colon diverticulosis present. Lymphatic: No enlarged lymph nodes are seen. Reproductive: Prostate is unremarkable. Other: There is a small fat containing umbilical hernia. No abdominopelvic ascites. Musculoskeletal: No acute or significant osseous findings. Review of the MIP images confirms the above findings. IMPRESSION: 1. Ascending aortic aneurysm measuring 6.7 cm with intramural hematoma measuring up to 2 cm. Findings compatible with acute aortic syndrome. Recommend urgent cardiothoracic surgery consultation. 2. 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 No evidence for aneurysm. 3. Cardiomegaly. 4. Mild hepatomegaly. 5. Indeterminate left adrenal nodule measuring 2 cm. Recommend nonemergent adrenal protocol CT or MRI. 6. Colonic diverticulosis. Aortic Atherosclerosis  (ICD10-I70.0) and Emphysema (ICD10-J43.9). Electronically Signed   By: Greig Pique M.D.   On: 01/07/2024 17:11   CT Angio Chest PE W and/or Wo Contrast Result Date: 01/07/2024 CLINICAL DATA:  Shortness of breath. High probability for pulmonary embolism. EXAM: CT ANGIOGRAPHY CHEST WITH CONTRAST TECHNIQUE: Multidetector CT imaging of the chest was performed using the standard protocol during bolus administration of intravenous contrast. Multiplanar CT image reconstructions and MIPs were obtained to evaluate the vascular anatomy. RADIATION DOSE REDUCTION: This exam was performed according to the departmental dose-optimization program which includes automated exposure control, adjustment of the mA and/or kV according to patient size and/or use of iterative reconstruction technique. CONTRAST:  OMNIPAQUE  IOHEXOL  350 MG/ML SOLN COMPARISON:  CT angiogram chest abdomen and pelvis 01/05/2021. FINDINGS: Cardiovascular: Ascending aorta measures 6.1 x 6.2 cm and appears slightly increased in size when compared to the prior study. There is some indistinctness and haziness surrounding the ascending aorta which was not seen on the prior examination. The left ventricle is significantly dilated. The heart is mildly. No pericardial effusion. There is adequate opacification of the  pulmonary arteries to the segmental level. There is no evidence for pulmonary embolism clear Mediastinum/Nodes: Mild emphysema present. There is atelectasis in the left lower lobe. The lungs are otherwise clear. There is no pleural effusion or pneumothorax. Lungs/Pleura: Lungs are clear. No pleural effusion or pneumothorax. Upper Abdomen: No acute abnormality. Musculoskeletal: No chest wall abnormality. No acute or significant osseous findings. Review of the MIP images confirms the above findings. IMPRESSION: 1. No evidence for pulmonary embolism. 2. Ascending aortic aneurysm measuring 6.2 cm, increased in size when compared to the prior study.  There is some indistinctness and haziness surrounding the ascending aorta which was not seen on the prior examination. Findings may be related to acute aortitis or other acute aortic pathology. Recommend further evaluation with CT angiogram chest dissection protocol and cardiothoracic surgery consultation. 3. Significant dilatation of the left ventricle. 4. Mild emphysema. Emphysema (ICD10-J43.9). These results were called by telephone at the time of interpretation on 01/07/2024 at 4:25 pm to provider Runa Whittingham , who verbally acknowledged these results. Electronically Signed   By: Greig Pique M.D.   On: 01/07/2024 16:27   DG Chest 2 View Result Date: 01/07/2024 CLINICAL DATA:  Chest pain. EXAM: CHEST - 2 VIEW COMPARISON:  May 15, 2014 FINDINGS: The cardiac silhouette is mildly enlarged and unchanged in size. No acute infiltrate, pleural effusion or pneumothorax is identified. The visualized skeletal structures are unremarkable. IMPRESSION: Stable cardiomegaly without active cardiopulmonary disease. Electronically Signed   By: Suzen Dials M.D.   On: 01/07/2024 14:12     Procedures   Medications Ordered in the ED  esmolol (BREVIBLOC) 2000 mg / 100 mL (20 mg/mL) infusion (100 mcg/kg/min  120.2 kg Intravenous New Bag/Given 01/07/24 1708)  prochlorperazine (COMPAZINE) injection 5 mg (5 mg Intravenous Given 01/07/24 1555)  diphenhydrAMINE (BENADRYL) injection 12.5 mg (12.5 mg Intravenous Given 01/07/24 1554)  iohexol  (OMNIPAQUE ) 350 MG/ML injection 100 mL (100 mLs Intravenous Contrast Given 01/07/24 1605)  fentaNYL  (SUBLIMAZE ) injection 50 mcg (50 mcg Intravenous Given 01/07/24 1656)  iohexol  (OMNIPAQUE ) 350 MG/ML injection 100 mL (100 mLs Intravenous Contrast Given 01/07/24 1637)    Clinical Course as of 01/07/24 1720  Wed Jan 07, 2024  1343 EKG 12-Lead [AA]    Clinical Course User Index [AA] Hildegard Loge, PA-C                                 Medical Decision Making Amount and/or  Complexity of Data Reviewed Labs: ordered. Radiology: ordered.  Risk Prescription drug management.   Jeremy [name] is here with chest pain.  History of cardiomyopathy.  He does endorse anxiety took Xanax before coming here.  Denies any recent surgery or travel.  Vital signs are unremarkable.  EKG shows sinus rhythm.  No major changes from previous EKG. overall differential diagnosis includes ACS, PE seems like he does have a history of aneurysm on prior imaging and could be a dissection, could be anxiety related process.  Will get CBC CMP troponin chest x-ray PE scan.  His vitals are unremarkable.  Is not hypoxic.  Troponin is 19 and 20.  Lab work is otherwise unremarkable.  PE scan showed no evidence of PE but radiology did call and said there is increased size of his ascending aortic aneurysm of 6.2 cm.  This is increased in size when compared to prior study.  There is some indistinctness and haziness around the ascending aorta which is not  seen on prior examination.  Findings may be related to acute aortitis or other aortic pathology.  Radiology is recommending a CT dissection.  I immediately called CT to come get him back and activated a code medical.  Overall per my review I do see what looks like a thrombus in the ascending aorta.  I talked with Dr. Kerrin with CT surgery on the phone.  He recommends ED to ED transport to [facility] for evaluation.  He is going to talk with radiology.  He is on esmolol.  Does admit to cocaine use but has not used in several days.  Blood pressures improved on esmolol.  I talked with Dr. Serene with vascular surgery regarding small focal dissection in the left common iliac artery.  He will evaluate the patient as well.  I have made Dr. Elnor in Dr. Randol aware of patient's transfer to [facility] ED.  Family and patient have been updated.  This chart was dictated using voice recognition software.  Despite best efforts to proofread,  errors can occur  which can change the documentation meaning.      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

## 2024-01-07 NOTE — Progress Notes (Signed)
 eLink Physician-Brief Progress Note Patient Name: MARQUIE ADERHOLD DOB: 1981-03-01 MRN: [id number]   Date of Service  01/07/2024  HPI/Events of Note  Patient admitted due to chest pain secondary to uncontrolled hypertension and aortic dissection, he has a history of aortic aneurysm. Surgery is planned.  eICU Interventions  New Patient Evaluation.        Skylie Hiott U Wandra Babin 01/07/2024, 9:18 PM

## 2024-01-07 NOTE — ED Provider Notes (Signed)
  Provider Note MRN:  [id number]  Arrival date & time: 01/07/24    ED Course and Medical Decision Making  Received [facility]  See note from prior team for complete details, in brief:  43 year old male history of polysubstance abuse initially presented to ER secondary to anxiety and chest pain. He is asymptomatic at this time. He underwent CTA of his chest with concern for acute aortic syndrome, aortic thrombus. CT surgery and vascular surgery were both consulted.  Clinical Course as of 01/07/24 1819  Wed Jan 07, 2024  1343 EKG 12-Lead [AA]  1747 Spoke with Dr Kerrin, he will come see at bedside  Also spoke with Dr. Serene [SG]    Clinical Course User Index [AA] Hildegard Loge, PA-C [SG] Elnor Jayson LABOR, DO    Spoke w/ Dr Kerrin Will admit  Plan surgery for tomorrow    .Critical Care  Performed by: Elnor Jayson LABOR, DO Authorized by: Elnor Jayson LABOR, DO   Critical care provider statement:    Critical care time (minutes):  30   Critical care time was exclusive of:  Separately billable procedures and treating other patients   Critical care was necessary to treat or prevent imminent or life-threatening deterioration of the following conditions: acute aortic syndrome.   Critical care was time spent personally by me on the following activities:  Development of treatment plan with patient or surrogate, discussions with consultants, evaluation of patient's response to treatment, examination of patient, ordering and review of laboratory studies, ordering and review of radiographic studies, ordering and performing treatments and interventions, pulse oximetry, re-evaluation of patient's condition, review of old charts and obtaining history from patient or surrogate   Care discussed with: admitting provider     Final Clinical Impressions(s) / ED Diagnoses     ICD-10-CM   1. Aortic thrombus (HCC)  I74.10     2. 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

## 2024-01-07 NOTE — H&P (Signed)
 Jeremy [name] is an 43 y.o. male.   Chief Complaint: CP HPI: 43 yo man with known ascending aneurysm  presented with CP and SOB.  [name] is a 43 yo man with a history of hypertension and nonischemic cardiomyopathy.  Sudden onset of CP and SOB this afternoon.  Went to [facility] and found to be profoundly hypertensive.  Pain resolved with BP control.  CT showed enlarged ascending aorta, no PE.  Repeat CT showed intramural hematoma of distal ascending/ proximal arch.  No dissection.  Recent cocaine use.  Currently pain free.  Past Medical History:  Diagnosis Date   Back pain    Hx of cardiac catheterization    a. 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 EF 35 to 40% by LHC on 05/15/14 likely due to hypertensive CM    No past surgical history on file.  Family History  Problem Relation Age of Onset   Heart attack Mother    Cancer Mother    Heart failure Maternal Grandfather    Heart attack Maternal Grandfather    Cancer Maternal Grandmother    Stroke Neg Hx    Social History:  reports that he has been smoking. He uses smokeless tobacco. He reports current alcohol use. He reports current drug use. Drug: Marijuana.  Allergies:  Allergies  Allergen Reactions   Ampicillin Hives   Penicillins Hives    (Not in a hospital admission)   Results for orders placed or performed during the hospital encounter of 01/07/24 (from the past 48 hours)  Basic metabolic panel     Status: None   Collection Time: 01/07/24  1:48 PM  Result Value Ref Range   Sodium 141 135 - 145 mmol/L   Potassium 4.3 3.5 - 5.1 mmol/L    Comment: HEMOLYSIS AT THIS LEVEL MAY AFFECT RESULT SLIGHT HEMOLYSIS    Chloride 104 98 - 111 mmol/L   CO2 25 22 - 32 mmol/L   Glucose,  Bld 93 70 - 99 mg/dL    Comment: Glucose reference range applies only to samples taken after fasting for at least 8 hours.   BUN 15 6 - 20 mg/dL   Creatinine, Ser 8.96 0.61 - 1.24 mg/dL   Calcium 9.7 8.9 - 89.6 mg/dL   GFR, Estimated >39 >39 mL/min    Comment: (NOTE) Calculated using the CKD-EPI Creatinine Equation (2021)    Anion gap 12 5 - 15    Comment: Performed at [facility], [address], [place], [place] [address]  CBC     Status: None   Collection Time: 01/07/24  1:48 PM  Result Value Ref Range   WBC 8.5 4.0 - 10.5 K/uL   RBC 4.95 4.22 - 5.81 MIL/uL   Hemoglobin 15.0 13.0 - 17.0 g/dL   HCT 57.0 60.9 - 47.9 %   MCV 86.7 80.0 - 100.0 fL   MCH 30.3 26.0 - 34.0 pg   MCHC 35.0 30.0 - 36.0 g/dL   RDW 84.9 88.4 - 84.4 %   Platelets 271 150 - 400 K/uL   nRBC 0.0 0.0 - 0.2 %    Comment: Performed at [facility], [address], [place], [place] [address]  Troponin T, High Sensitivity     Status: Abnormal  Collection Time: 01/07/24  1:48 PM  Result Value Ref Range   Troponin T High Sensitivity 19 (H) <19 ng/L    Comment: Performed at [facility], [address], [place], [place] [address]  Troponin T, High Sensitivity     Status: Abnormal   Collection Time: 01/07/24  3:45 PM  Result Value Ref Range   Troponin T High Sensitivity 20 (H) <19 ng/L    Comment: (NOTE) Biotin concentrations > 1000 ng/mL falsely decrease TnT results.  Serial cardiac troponin measurements are suggested.  Refer to the Links section for chest pain algorithms and additional  guidance. Performed at [facility], [address] [place], [place] [address]   Urine Drug Screen     Status: Abnormal   Collection Time: 01/07/24  4:35 PM  Result Value Ref Range   Opiates NONE DETECTED NONE DETECTED   Cocaine DETECTED (A) NONE DETECTED   Benzodiazepines NONE DETECTED NONE DETECTED   Amphetamines NONE DETECTED NONE  DETECTED   Tetrahydrocannabinol DETECTED (A) NONE DETECTED   Barbiturates NONE DETECTED NONE DETECTED   Methadone Scn, Ur NONE DETECTED NONE DETECTED   Fentanyl  NONE DETECTED NONE DETECTED    Comment: (NOTE) Drug Screen for Medical Purposes only. If confirmation is needed for any purpose, notify lab within 5 days. Drug Class                 Cutoff (ng/mL) Amphetamine and metabolites 1000 Barbiturate and metabolites 200 Benzodiazepine              200 Opiates and metabolites     300 Cocaine and metabolites     300 THC                         50 Fentanyl                     5 Methadone                   300  Trazodone is metabolized in vivo to several metabolites,  including pharmacologically active m-CPP, which is excreted in the  urine.  Immunoassay screens for amphetamines and MDMA have potential  cross-reactivity with these compounds and may provide false positive  result.  Performed at [facility], [address] [place], [place] [address]    CT Angio Chest/Abd/Pel for Dissection W and/or Wo Contrast Result Date: 01/07/2024 CLINICAL DATA:  Acute aortic syndrome suspected. EXAM: CT ANGIOGRAPHY CHEST, ABDOMEN AND PELVIS TECHNIQUE: Noncontrast chest CT was performed. Multidetector CT imaging through the chest, abdomen and pelvis was performed using the standard protocol during bolus administration of intravenous contrast. Multiplanar reconstructed images and MIPs were obtained and reviewed to evaluate the vascular anatomy. RADIATION DOSE REDUCTION: This exam was performed according to the departmental dose-optimization program which includes automated exposure control, adjustment of the mA and/or kV according to patient size and/or use of iterative reconstruction technique. CONTRAST:  OMNIPAQUE  IOHEXOL  350 MG/ML SOLN COMPARISON:  01/07/2024 and 01/05/2021. FINDINGS: CTA CHEST FINDINGS Cardiovascular: Motion artifact limits evaluation. There is intramural  hematoma involving the ascending aorta and aortic arch measuring up to 2 cm in thickness. There is ascending aortic aneurysm measuring 6.7 x 6.6 cm. Aortic arch and descending aorta appear normal in size. Origin of the great vessels appears within normal limits. The heart appears enlarged, particularly the left ventricle. There is no pericardial effusion. Mediastinum/Nodes: Posterior right thyroid nodule present measuring 18 mm.  No enlarged lymph nodes identified. Esophagus within normal limits. Lungs/Pleura: Mild emphysema. There is a band of atelectasis in the left lower lobe. The lungs are otherwise clear. No pleural effusion or pneumothorax. Musculoskeletal: No chest wall abnormality. No acute or significant osseous findings. Review of the MIP images confirms the above findings. CTA ABDOMEN AND PELVIS FINDINGS VASCULAR Aorta: Normal caliber aorta without aneurysm, dissection, vasculitis or significant stenosis. There are minimal calcified plaques in the aorta. Celiac: Patent without evidence of aneurysm, dissection, vasculitis or significant stenosis. SMA: Patent without evidence of aneurysm, dissection, vasculitis or significant stenosis. Renals: Both renal arteries are patent without evidence of aneurysm, dissection, vasculitis, fibromuscular dysplasia or significant stenosis. Accessory right renal artery present. IMA: Patent without evidence of aneurysm, dissection, vasculitis or significant stenosis. Inflow: There is a small focal dissection in the left common iliac artery. No evidence for aneurysm. External and internal iliac arteries are within normal limits on the left. Right common iliac, external iliac and internal iliac arteries are within normal limits. Veins: No obvious venous abnormality within the limitations of this arterial phase study. Review of the MIP images confirms the above findings. NON-VASCULAR Hepatobiliary: Liver is mildly enlarged. No focal liver abnormality is seen. No gallstones,  gallbladder wall thickening, or biliary dilatation. Pancreas: Unremarkable. No pancreatic ductal dilatation or surrounding inflammatory changes. Spleen: Normal in size without focal abnormality. Adrenals/Urinary Tract: There is an indeterminate left adrenal nodule measuring 2 0.4 x 2.0 cm. Right adrenal gland is within normal limits. Bilateral kidneys appear within normal limits. The bladder is within normal limits. Stomach/Bowel: Stomach is within normal limits. Appendix appears normal. No evidence of bowel wall thickening, distention, or inflammatory changes. Sigmoid and descending colon diverticulosis present. Lymphatic: No enlarged lymph nodes are seen. Reproductive: Prostate is unremarkable. Other: There is a small fat containing umbilical hernia. No abdominopelvic ascites. Musculoskeletal: No acute or significant osseous findings. Review of the MIP images confirms the above findings. IMPRESSION: 1. Ascending aortic aneurysm measuring 6.7 cm with intramural hematoma measuring up to 2 cm. Findings compatible with acute aortic syndrome. Recommend urgent cardiothoracic surgery consultation. 2. 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 No evidence for aneurysm. 3. Cardiomegaly. 4. Mild hepatomegaly. 5. Indeterminate left adrenal nodule measuring 2 cm. Recommend nonemergent adrenal protocol CT or MRI. 6. Colonic diverticulosis. Aortic Atherosclerosis (ICD10-I70.0) and Emphysema (ICD10-J43.9). Electronically Signed   By: Greig Pique M.D.   On: 01/07/2024 17:11   CT Angio Chest PE W and/or Wo Contrast Result Date: 01/07/2024 CLINICAL DATA:  Shortness of breath. High probability for pulmonary embolism. EXAM: CT ANGIOGRAPHY CHEST WITH CONTRAST TECHNIQUE: Multidetector CT imaging of the chest was performed using the standard protocol during bolus administration of intravenous contrast. Multiplanar CT image reconstructions and MIPs were obtained to evaluate the vascular anatomy. RADIATION DOSE  REDUCTION: This exam was performed according to the departmental dose-optimization program which includes automated exposure control, adjustment of the mA and/or kV according to patient size and/or use of iterative reconstruction technique. CONTRAST:  OMNIPAQUE  IOHEXOL  350 MG/ML SOLN COMPARISON:  CT angiogram chest abdomen and pelvis 01/05/2021. FINDINGS: Cardiovascular: Ascending aorta measures 6.1 x 6.2 cm and appears slightly increased in size when compared to the prior study. There is some indistinctness and haziness surrounding the ascending aorta which was not seen on the prior examination. The left ventricle is significantly dilated. The heart is mildly. No pericardial effusion. There is adequate opacification of the pulmonary arteries to the segmental level. There is no evidence for  pulmonary embolism clear Mediastinum/Nodes: Mild emphysema present. There is atelectasis in the left lower lobe. The lungs are otherwise clear. There is no pleural effusion or pneumothorax. Lungs/Pleura: Lungs are clear. No pleural effusion or pneumothorax. Upper Abdomen: No acute abnormality. Musculoskeletal: No chest wall abnormality. No acute or significant osseous findings. Review of the MIP images confirms the above findings. IMPRESSION: 1. No evidence for pulmonary embolism. 2. Ascending aortic aneurysm measuring 6.2 cm, increased in size when compared to the prior study. There is some indistinctness and haziness surrounding the ascending aorta which was not seen on the prior examination. Findings may be related to acute aortitis or other acute aortic pathology. Recommend further evaluation with CT angiogram chest dissection protocol and cardiothoracic surgery consultation. 3. Significant dilatation of the left ventricle. 4. Mild emphysema. Emphysema (ICD10-J43.9). These results were called by telephone at the time of interpretation on 01/07/2024 at 4:25 pm to provider ADAM CURATOLO , who verbally acknowledged these  results. Electronically Signed   By: Greig Pique M.D.   On: 01/07/2024 16:27   DG Chest 2 View Result Date: 01/07/2024 CLINICAL DATA:  Chest pain. EXAM: CHEST - 2 VIEW COMPARISON:  May 15, 2014 FINDINGS: The cardiac silhouette is mildly enlarged and unchanged in size. No acute infiltrate, pleural effusion or pneumothorax is identified. The visualized skeletal structures are unremarkable. IMPRESSION: Stable cardiomegaly without active cardiopulmonary disease. Electronically Signed   By: Suzen Dials M.D.   On: 01/07/2024 14:12    Review of Systems  HENT:  Positive for dental problem.   Respiratory:  Positive for shortness of breath.   Cardiovascular:  Positive for chest pain. Negative for leg swelling.  Neurological:  Negative for weakness.    Blood pressure (!) 127/58, pulse 63, temperature 98.2 F (36.8 C), temperature source Oral, resp. rate 17, weight 117.9 kg, SpO2 99%. Physical Exam Constitutional:      Appearance: He is well-developed.  HENT:     Head: Normocephalic and atraumatic.     Comments: Poor dentition with missing teeth  Eyes:     Extraocular Movements: Extraocular movements intact.     Pupils: Pupils are equal, round, and reactive to light.    Cardiovascular:     Rate and Rhythm: Normal rate and regular rhythm.     Pulses:          Carotid pulses are 3+ on the right side and 3+ on the left side.      Radial pulses are 3+ on the right side and 3+ on the left side.       Dorsalis pedis pulses are 3+ on the right side and 3+ on the left side.       Posterior tibial pulses are 3+ on the right side and 3+ on the left side.     Heart sounds: Murmur (systolic and diastolic) heard.     Systolic murmur is present with a grade of 2/6.     Diastolic murmur is present with a grade of 2/4.  Pulmonary:     Effort: Pulmonary effort is normal.     Breath sounds: Normal breath sounds.  Abdominal:     General: There is no distension.     Palpations: Abdomen is soft.    Musculoskeletal:        General: No swelling.     Cervical back: Neck supple.   Skin:    General: Skin is warm and dry.     Capillary Refill: Capillary refill takes less than 2 seconds.  Comments: Furuncles chest wall   Neurological:     General: No focal deficit present.     Mental Status: He is alert and oriented to person, place, and time.     Cranial Nerves: No cranial nerve deficit.     Motor: No weakness.      Assessment/Plan Schneur Crowson is a 43 year old gentleman with a history of hypertension, nonischemic cardiomyopathy, cocaine abuse, and known ascending aneurysm from a CT in 2022.  Has had recent cocaine use.  Presented to the [facility] with chest pain and shortness of breath.  Sudden onset this afternoon.  Initial CT done with PE protocol showed enlarged aorta.  Repeat CT using dissection protocol showed aortic intramural hematoma in the distal ascending and proximal arch.  No dissection flap noted.  There is a very limited dissection in the common iliac, but no flow limitation.  He had immediate relief of pain with blood pressure control.  Now is in sinus rhythm with no chest pain or shortness of breath.  On exam has prominent pulse pressures and a diastolic murmur.  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  If acute I would expect him to be symptomatic.  Type I aortic intramural hematoma in setting of known aneurysm.  Needs surgical repair.  Is currently stable with well-controlled blood pressure and asymptomatic.  Will plan urgent repair first case tomorrow.  Will plan to replace aortic root through the arch.  Will likely need circulatory arrest.  The arch itself is not aneurysmal and if a tear is noted proximal to that would not have to do complex arch repair.  Will assess aortic valve intraoperatively to see if it might be repaired or whether replacement will be necessary.  I discussed the general  nature of the procedure, including the need for general anesthesia, the incisions to be used, the use of cardiopulmonary bypass, the use of circulatory arrest, and the use of temporary pacemaker wires and drainage tubes postoperatively with Mr. Ptacek and his mother.  We discussed the expected hospital stay, overall recovery and short and long term outcomes. I informed them of the indications, risks, benefits and alternatives.   He understands the risks include, but are not limited to death, stroke, other neurologic injury, MI, DVT/PE, bleeding, possible need for transfusion, infections, cardiac arrhythmias, possible need for aortic valve replacement, possible heart block requiring pacemaker placement, as well as other organ system dysfunction including respiratory, renal, or GI complications.    We did discuss tissue versus mechanical valves.  Given that he is only 43 years old particularly in the setting of hypertension he would not do well with a tissue valve.  I do have some concerns about compliance with the mechanical valve but he is agreeable to to mechanical valve should it be necessary.  Obviously will preserve his native valve with possible.  [name], MD 01/07/2024, 6:05 PM

## 2024-01-07 NOTE — ED Notes (Signed)
 Patient arrives from [facility] on 125 mcg/kg/min of esmolol, patient alert and oriented, endorses slight chest pain 2/10.

## 2024-01-07 NOTE — ED Triage Notes (Signed)
 C/o central CP w/ SHOB x 1 hr.  Hx of HTN and CAD. Took xanax and 162mg  asa pta.

## 2024-01-07 NOTE — ED Notes (Signed)
 Zachary at [facility] called for emergent transport

## 2024-01-07 NOTE — ED Notes (Signed)
 Goal SBP on Esmolol 90s per EDP verbal order

## 2024-01-07 NOTE — Consult Note (Signed)
[facility]  Patient name: Jeremy [name] MRN: [id number] DOB: [birth year] Sex: male   REQUESTING PROVIDER:    ER   REASON FOR CONSULT:    Iliac artery dissection  HISTORY OF PRESENT ILLNESS:   Jeremy [name] is a 43 y.o. male, who presented to the [facility] emergency department with chest pain.  He has a known ascending aortic aneurysm.  CT scan showed progressive aneurysmal change of his ascending aorta with intramural hematoma.  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  He denies any claudication symptoms.  His chest pain has resolved.  The patient recently used cocaine.  He has a history of hypertension and was very hypertensive upon arrival.  He has known nonischemic cardiomyopathy.  He underwent cardiac catheterization in 2015.  At that time ejection fraction was 35-40%.  He is a current smoker.  PAST MEDICAL HISTORY    Past Medical History:  Diagnosis Date   Back pain    Hx of cardiac catheterization    a. 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 EF 35 to 40% by LHC on 05/15/14 likely due to hypertensive CM     FAMILY HISTORY   Family History  Problem Relation Age of Onset   Heart attack Mother    Cancer Mother    Heart failure Maternal Grandfather    Heart attack Maternal Grandfather    Cancer Maternal Grandmother    Stroke Neg Hx     SOCIAL HISTORY:   Social History   Socioeconomic History   Marital status: Single    Spouse name: Not on file   Number of children: Not on file   Years of education: Not on file   Highest education level: Not on file  Occupational History   Not on file  Tobacco Use   Smoking status:  Every Day   Smokeless tobacco: Current  Vaping Use   Vaping status: Some Days  Substance and Sexual Activity   Alcohol use: Yes   Drug use: Yes    Types: Marijuana   Sexual activity: Not on file  Other Topics Concern   Not on file  Social History Narrative   Not on file   Social Drivers of Health   Financial Resource Strain: Not on file  Food Insecurity: Not on file  Transportation Needs: Not on file  Physical Activity: Not on file  Stress: Not on file  Social Connections: Not on file  Intimate Partner Violence: Not on file    ALLERGIES:    Allergies  Allergen Reactions   Penicillins Hives   Principen [Ampicillin] Hives    CURRENT MEDICATIONS:    Current Facility-Administered Medications  Medication Dose Route Frequency Provider Last Rate Last Admin   acetaminophen  (TYLENOL ) 160 MG/5ML solution 650 mg  650 mg Oral Q6H PRN Hendrickson, Steven C, MD       ALPRAZolam (XANAX) tablet 0.25-0.5 mg  0.25-0.5 mg Oral Q4H PRN Kerrin Elspeth BROCKS, MD       amLODipine  (NORVASC ) tablet 5 mg  5 mg Oral Daily Hendrickson, Steven C, MD   5 mg at 01/07/24 1926   [START ON 01/08/2024] ceFAZolin (ANCEF) IVPB 2g/100 mL premix  2 g Intravenous To OR Kerrin,  Elspeth BROCKS, MD       [START ON 01/08/2024] ceFAZolin (ANCEF) IVPB 2g/100 mL premix  2 g Intravenous To OR Kerrin Elspeth BROCKS, MD       [START ON 01/08/2024] chlorhexidine (PERIDEX) 0.12 % solution 15 mL  15 mL Mouth/Throat Once Kerrin Elspeth BROCKS, MD       Chlorhexidine Gluconate Cloth 2 % PADS 6 each  6 each Topical Once Hendrickson, Steven C, MD       [START ON 01/08/2024] dexmedetomidine (PRECEDEX) 400 MCG/100ML (4 mcg/mL) infusion  0.1-0.7 mcg/kg/hr Intravenous To OR Kerrin Elspeth BROCKS, MD       [START ON 01/08/2024] EPINEPHrine (ADRENALIN) 5 mg in NS 250 mL (0.02 mg/mL) premix infusion  0-10 mcg/min Intravenous To OR Kerrin Elspeth BROCKS, MD       esmolol (BREVIBLOC) 2000 mg / 100 mL (20 mg/mL) infusion  25-300 mcg/kg/min  Intravenous Continuous Curatolo, Adam, DO 108.2 mL/hr at 01/07/24 2159 300 mcg/kg/min at 01/07/24 2159   [START ON 01/08/2024] heparin  30,000 units/NS 1000 mL solution for CELLSAVER   Other To OR Kerrin Elspeth BROCKS, MD       [START ON 01/08/2024] heparin  sodium (porcine) 2,500 Units, papaverine 30 mg in electrolyte-A (PLASMALYTE-A PH 7.4) 500 mL irrigation   Irrigation To OR Kerrin Elspeth BROCKS, MD       [START ON 01/08/2024] insulin regular, human (MYXREDLIN) 100 units/ 100 mL infusion   Intravenous To OR Kerrin Elspeth BROCKS, MD       [START ON 01/08/2024] [facility] Blood Cardioplegia vial (lidocaine /magnesium/mannitol 0.26g-4g-6.4g)   Intracoronary To OR Kerrin Elspeth BROCKS, MD       [START ON 01/08/2024] milrinone (PRIMACOR) 20 MG/100 ML (0.2 mg/mL) infusion  0.3 mcg/kg/min Intravenous To OR Kerrin Elspeth BROCKS, MD       [START ON 01/08/2024] nitroGLYCERIN  50 mg in dextrose 5 % 250 mL (0.2 mg/mL) infusion  2-200 mcg/min Intravenous To OR Kerrin Elspeth BROCKS, MD       [START ON 01/08/2024] norepinephrine (LEVOPHED) 4mg  in (0.016 mg/mL) premix infusion  0-40 mcg/min Intravenous To OR Kerrin Elspeth BROCKS, MD       [START ON 01/08/2024] phenylephrine (NEO-SYNEPHRINE) 20mg /NS 250mL premix infusion  30-200 mcg/min Intravenous To OR Kerrin Elspeth BROCKS, MD       [START ON 01/08/2024] potassium chloride  injection 80 mEq  80 mEq Other To OR Kerrin Elspeth BROCKS, MD       tiZANidine  (ZANAFLEX ) tablet 4 mg  4 mg Oral Q6H PRN Kerrin Elspeth BROCKS, MD       traMADol  (ULTRAM ) tablet 50 mg  50 mg Oral Q12H PRN Kerrin Elspeth BROCKS, MD   50 mg at 01/07/24 1925   [START ON 01/08/2024] tranexamic acid (CYKLOKAPRON) 2,500 mg in sodium chloride  0.9 % 250 mL (10 mg/mL) infusion  1.5 mg/kg/hr Intravenous To OR Kerrin Elspeth BROCKS, MD       [START ON 01/08/2024] tranexamic acid (CYKLOKAPRON) bolus via infusion - over 30 minutes 1,768.5 mg  15 mg/kg Intravenous To OR Kerrin Elspeth BROCKS, MD       [START  ON 01/08/2024] tranexamic acid (CYKLOKAPRON) pump prime solution 236 mg  2 mg/kg Intracatheter To OR Kerrin Elspeth BROCKS, MD       [START ON 01/08/2024] Vancomycin (VANCOCIN) 1,500 mg in sodium chloride  0.9 % 500 mL IVPB  1,500 mg Intravenous To OR Kerrin Elspeth BROCKS, MD        REVIEW OF SYSTEMS:   [X]  denotes positive finding, [ ]  denotes negative finding  Cardiac  Comments:  Chest pain or chest pressure: x   Shortness of breath upon exertion:    Short of breath when lying flat:    Irregular heart rhythm:        Vascular    Pain in calf, thigh, or hip brought on by ambulation:    Pain in feet at night that wakes you up from your sleep:     Blood clot in your veins:    Leg swelling:         Pulmonary    Oxygen at home:    Productive cough:     Wheezing:         Neurologic    Sudden weakness in arms or legs:     Sudden numbness in arms or legs:     Sudden onset of difficulty speaking or slurred speech:    Temporary loss of vision in one eye:     Problems with dizziness:         Gastrointestinal    Blood in stool:      Vomited blood:         Genitourinary    Burning when urinating:     Blood in urine:        Psychiatric    Major depression:         Hematologic    Bleeding problems:    Problems with blood clotting too easily:        Skin    Rashes or ulcers:        Constitutional    Fever or chills:     PHYSICAL EXAM:   Vitals:   01/07/24 1915 01/07/24 1926 01/07/24 2004 01/07/24 2100  BP: (!) 129/43 (!) 129/43    Pulse: 65     Resp: 17     Temp:   98.8 F (37.1 C)   TempSrc:   Oral   SpO2: 98%     Weight:    109.5 kg  Height:    6' 1 (1.854 m)    GENERAL: The patient is a well-nourished male, in no acute distress. The vital signs are documented above. CARDIAC: There is a regular rate and rhythm.  VASCULAR: Palpable femoral and pedal pulses bilaterally PULMONARY: Nonlabored respirations ABDOMEN: Soft and non-tender  MUSCULOSKELETAL: There are  no major deformities or cyanosis. NEUROLOGIC: No focal weakness or paresthesias are detected. SKIN: There are no ulcers or rashes noted. PSYCHIATRIC: The patient has a normal affect.  STUDIES:   I have reviewed the CT angiogram with the following findings:  1. Ascending aortic aneurysm measuring 6.7 cm with intramural hematoma measuring up to 2 cm. Findings compatible with acute aortic syndrome. Recommend urgent cardiothoracic surgery consultation. 2. 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 No evidence for aneurysm. 3. Cardiomegaly. 4. Mild hepatomegaly. 5. Indeterminate left adrenal nodule measuring 2 cm. Recommend nonemergent adrenal protocol CT or MRI. 6. Colonic diverticulosis. ASSESSMENT and PLAN   Left common iliac dissection.  This was not seen on his CT scan several years ago.  It is a very focal lesion.  He does not have any signs of vascular compromise to either extremity.  This is an incidental finding.  It can be followed as an outpatient with repeat imaging.  No vascular intervention is recommended at this time.  He is scheduled for a standing aortic repair by Dr. Kerrin tomorrow   Malvina Serene CLORE, MD, FACS [facility] [phone] Pager [phone])  [phone]  

## 2024-01-07 NOTE — ED Notes (Signed)
 Pt had run of v-tach, strip captured and EDP notified

## 2024-01-07 NOTE — ED Notes (Signed)
 Paged Dr. Serene, vasc surg for consult [phone]

## 2024-01-08 ENCOUNTER — Inpatient Hospital Stay (HOSPITAL_COMMUNITY)

## 2024-01-08 ENCOUNTER — Inpatient Hospital Stay (HOSPITAL_COMMUNITY): Payer: Self-pay | Admitting: Certified Registered Nurse Anesthetist

## 2024-01-08 ENCOUNTER — Encounter (HOSPITAL_COMMUNITY)
Admission: EM | Discharge status: Against Medical Advice | Payer: Self-pay | Source: Home / Self Care | Attending: Thoracic Surgery (Cardiothoracic Vascular Surgery)

## 2024-01-08 ENCOUNTER — Inpatient Hospital Stay (HOSPITAL_COMMUNITY): Admitting: Anesthesiology

## 2024-01-08 ENCOUNTER — Other Ambulatory Visit: Payer: Self-pay

## 2024-01-08 ENCOUNTER — Encounter (HOSPITAL_COMMUNITY): Payer: Self-pay | Admitting: Thoracic Surgery (Cardiothoracic Vascular Surgery)

## 2024-01-08 DIAGNOSIS — I509 Heart failure, unspecified: Secondary | ICD-10-CM | POA: Diagnosis not present

## 2024-01-08 DIAGNOSIS — I7100 Dissection of unspecified site of aorta: Secondary | ICD-10-CM

## 2024-01-08 DIAGNOSIS — I97618 Postprocedural hemorrhage and hematoma of a circulatory system organ or structure following other circulatory system procedure: Secondary | ICD-10-CM

## 2024-01-08 DIAGNOSIS — I110 Hypertensive heart disease with heart failure: Secondary | ICD-10-CM | POA: Diagnosis not present

## 2024-01-08 DIAGNOSIS — F17210 Nicotine dependence, cigarettes, uncomplicated: Secondary | ICD-10-CM

## 2024-01-08 DIAGNOSIS — I71019 Dissection of thoracic aorta, unspecified: Secondary | ICD-10-CM | POA: Diagnosis not present

## 2024-01-08 DIAGNOSIS — Z95828 Presence of other vascular implants and grafts: Secondary | ICD-10-CM

## 2024-01-08 DIAGNOSIS — R579 Shock, unspecified: Secondary | ICD-10-CM

## 2024-01-08 DIAGNOSIS — I7101 Dissection of ascending aorta: Secondary | ICD-10-CM

## 2024-01-08 HISTORY — PX: THORACIC AORTIC ANEURYSM REPAIR: SHX799

## 2024-01-08 HISTORY — PX: INTRAOPERATIVE TRANSESOPHAGEAL ECHOCARDIOGRAM: SHX5062

## 2024-01-08 HISTORY — PX: EXPLORATION POST OPERATIVE OPEN HEART: SHX5061

## 2024-01-08 LAB — POCT I-STAT EG7
Acid-Base Excess: 1 mmol/L (ref 0.0–2.0)
Bicarbonate: 27 mmol/L (ref 20.0–28.0)
Calcium, Ion: 1.05 mmol/L — ABNORMAL LOW (ref 1.15–1.40)
HCT: 35 % — ABNORMAL LOW (ref 39.0–52.0)
Hemoglobin: 11.9 g/dL — ABNORMAL LOW (ref 13.0–17.0)
O2 Saturation: 73 %
Potassium: 3.7 mmol/L (ref 3.5–5.1)
Sodium: 138 mmol/L (ref 135–145)
TCO2: 28 mmol/L (ref 22–32)
pCO2, Ven: 46.2 mmHg (ref 44–60)
pH, Ven: 7.375 (ref 7.25–7.43)
pO2, Ven: 40 mmHg (ref 32–45)

## 2024-01-08 LAB — POCT I-STAT, CHEM 8
BUN: 10 mg/dL (ref 6–20)
BUN: 10 mg/dL (ref 6–20)
BUN: 11 mg/dL (ref 6–20)
BUN: 14 mg/dL (ref 6–20)
BUN: 14 mg/dL (ref 6–20)
BUN: 14 mg/dL (ref 6–20)
BUN: 13 mg/dL (ref 6–20)
BUN: 13 mg/dL (ref 6–20)
BUN: 13 mg/dL (ref 6–20)
BUN: 13 mg/dL (ref 6–20)
BUN: 13 mg/dL (ref 6–20)
BUN: 13 mg/dL (ref 6–20)
Calcium, Ion: 1.16 mmol/L (ref 1.15–1.40)
Calcium, Ion: 1.07 mmol/L — ABNORMAL LOW (ref 1.15–1.40)
Calcium, Ion: 1.12 mmol/L — ABNORMAL LOW (ref 1.15–1.40)
Calcium, Ion: 1.02 mmol/L — ABNORMAL LOW (ref 1.15–1.40)
Calcium, Ion: 1.03 mmol/L — ABNORMAL LOW (ref 1.15–1.40)
Calcium, Ion: 1.06 mmol/L — ABNORMAL LOW (ref 1.15–1.40)
Calcium, Ion: 0.83 mmol/L — CL (ref 1.15–1.40)
Calcium, Ion: 0.86 mmol/L — CL (ref 1.15–1.40)
Calcium, Ion: 1.04 mmol/L — ABNORMAL LOW (ref 1.15–1.40)
Calcium, Ion: 0.97 mmol/L — ABNORMAL LOW (ref 1.15–1.40)
Calcium, Ion: 0.93 mmol/L — ABNORMAL LOW (ref 1.15–1.40)
Calcium, Ion: 1.2 mmol/L (ref 1.15–1.40)
Chloride: 99 mmol/L (ref 98–111)
Chloride: 102 mmol/L (ref 98–111)
Chloride: 99 mmol/L (ref 98–111)
Chloride: 100 mmol/L (ref 98–111)
Chloride: 100 mmol/L (ref 98–111)
Chloride: 100 mmol/L (ref 98–111)
Chloride: 101 mmol/L (ref 98–111)
Chloride: 102 mmol/L (ref 98–111)
Chloride: 102 mmol/L (ref 98–111)
Chloride: 101 mmol/L (ref 98–111)
Chloride: 102 mmol/L (ref 98–111)
Chloride: 100 mmol/L (ref 98–111)
Creatinine, Ser: 1 mg/dL (ref 0.61–1.24)
Creatinine, Ser: 1 mg/dL (ref 0.61–1.24)
Creatinine, Ser: 1.1 mg/dL (ref 0.61–1.24)
Creatinine, Ser: 1.1 mg/dL (ref 0.61–1.24)
Creatinine, Ser: 1.1 mg/dL (ref 0.61–1.24)
Creatinine, Ser: 1.2 mg/dL (ref 0.61–1.24)
Creatinine, Ser: 1.2 mg/dL (ref 0.61–1.24)
Creatinine, Ser: 1.3 mg/dL — ABNORMAL HIGH (ref 0.61–1.24)
Creatinine, Ser: 1.4 mg/dL — ABNORMAL HIGH (ref 0.61–1.24)
Creatinine, Ser: 1.4 mg/dL — ABNORMAL HIGH (ref 0.61–1.24)
Creatinine, Ser: 1.6 mg/dL — ABNORMAL HIGH (ref 0.61–1.24)
Creatinine, Ser: 1.8 mg/dL — ABNORMAL HIGH (ref 0.61–1.24)
Glucose, Bld: 103 mg/dL — ABNORMAL HIGH (ref 70–99)
Glucose, Bld: 100 mg/dL — ABNORMAL HIGH (ref 70–99)
Glucose, Bld: 120 mg/dL — ABNORMAL HIGH (ref 70–99)
Glucose, Bld: 209 mg/dL — ABNORMAL HIGH (ref 70–99)
Glucose, Bld: 167 mg/dL — ABNORMAL HIGH (ref 70–99)
Glucose, Bld: 169 mg/dL — ABNORMAL HIGH (ref 70–99)
Glucose, Bld: 138 mg/dL — ABNORMAL HIGH (ref 70–99)
Glucose, Bld: 148 mg/dL — ABNORMAL HIGH (ref 70–99)
Glucose, Bld: 187 mg/dL — ABNORMAL HIGH (ref 70–99)
Glucose, Bld: 206 mg/dL — ABNORMAL HIGH (ref 70–99)
Glucose, Bld: 193 mg/dL — ABNORMAL HIGH (ref 70–99)
Glucose, Bld: 206 mg/dL — ABNORMAL HIGH (ref 70–99)
HCT: 45 % (ref 39.0–52.0)
HCT: 36 % — ABNORMAL LOW (ref 39.0–52.0)
HCT: 40 % (ref 39.0–52.0)
HCT: 37 % — ABNORMAL LOW (ref 39.0–52.0)
HCT: 34 % — ABNORMAL LOW (ref 39.0–52.0)
HCT: 34 % — ABNORMAL LOW (ref 39.0–52.0)
HCT: 22 % — ABNORMAL LOW (ref 39.0–52.0)
HCT: 25 % — ABNORMAL LOW (ref 39.0–52.0)
HCT: 26 % — ABNORMAL LOW (ref 39.0–52.0)
HCT: 28 % — ABNORMAL LOW (ref 39.0–52.0)
HCT: 27 % — ABNORMAL LOW (ref 39.0–52.0)
HCT: 21 % — ABNORMAL LOW (ref 39.0–52.0)
Hemoglobin: 15.3 g/dL (ref 13.0–17.0)
Hemoglobin: 12.2 g/dL — ABNORMAL LOW (ref 13.0–17.0)
Hemoglobin: 13.6 g/dL (ref 13.0–17.0)
Hemoglobin: 12.6 g/dL — ABNORMAL LOW (ref 13.0–17.0)
Hemoglobin: 11.6 g/dL — ABNORMAL LOW (ref 13.0–17.0)
Hemoglobin: 11.6 g/dL — ABNORMAL LOW (ref 13.0–17.0)
Hemoglobin: 7.5 g/dL — ABNORMAL LOW (ref 13.0–17.0)
Hemoglobin: 8.5 g/dL — ABNORMAL LOW (ref 13.0–17.0)
Hemoglobin: 8.8 g/dL — ABNORMAL LOW (ref 13.0–17.0)
Hemoglobin: 9.5 g/dL — ABNORMAL LOW (ref 13.0–17.0)
Hemoglobin: 9.2 g/dL — ABNORMAL LOW (ref 13.0–17.0)
Hemoglobin: 7.1 g/dL — ABNORMAL LOW (ref 13.0–17.0)
Potassium: 3.5 mmol/L (ref 3.5–5.1)
Potassium: 3.4 mmol/L — ABNORMAL LOW (ref 3.5–5.1)
Potassium: 4.1 mmol/L (ref 3.5–5.1)
Potassium: 5.8 mmol/L — ABNORMAL HIGH (ref 3.5–5.1)
Potassium: 4.8 mmol/L (ref 3.5–5.1)
Potassium: 5.4 mmol/L — ABNORMAL HIGH (ref 3.5–5.1)
Potassium: 5.2 mmol/L — ABNORMAL HIGH (ref 3.5–5.1)
Potassium: 6 mmol/L — ABNORMAL HIGH (ref 3.5–5.1)
Potassium: 4.8 mmol/L (ref 3.5–5.1)
Potassium: 4.9 mmol/L (ref 3.5–5.1)
Potassium: 4.5 mmol/L (ref 3.5–5.1)
Potassium: 3.8 mmol/L (ref 3.5–5.1)
Sodium: 139 mmol/L (ref 135–145)
Sodium: 134 mmol/L — ABNORMAL LOW (ref 135–145)
Sodium: 137 mmol/L (ref 135–145)
Sodium: 132 mmol/L — ABNORMAL LOW (ref 135–145)
Sodium: 131 mmol/L — ABNORMAL LOW (ref 135–145)
Sodium: 134 mmol/L — ABNORMAL LOW (ref 135–145)
Sodium: 135 mmol/L (ref 135–145)
Sodium: 136 mmol/L (ref 135–145)
Sodium: 138 mmol/L (ref 135–145)
Sodium: 138 mmol/L (ref 135–145)
Sodium: 139 mmol/L (ref 135–145)
Sodium: 143 mmol/L (ref 135–145)
TCO2: 29 mmol/L (ref 22–32)
TCO2: 24 mmol/L (ref 22–32)
TCO2: 28 mmol/L (ref 22–32)
TCO2: 25 mmol/L (ref 22–32)
TCO2: 25 mmol/L (ref 22–32)
TCO2: 27 mmol/L (ref 22–32)
TCO2: 25 mmol/L (ref 22–32)
TCO2: 26 mmol/L (ref 22–32)
TCO2: 23 mmol/L (ref 22–32)
TCO2: 23 mmol/L (ref 22–32)
TCO2: 23 mmol/L (ref 22–32)
TCO2: 25 mmol/L (ref 22–32)

## 2024-01-08 LAB — POCT I-STAT 7, (LYTES, BLD GAS, ICA,H+H)
Acid-Base Excess: 2 mmol/L (ref 0.0–2.0)
Acid-Base Excess: 1 mmol/L (ref 0.0–2.0)
Acid-Base Excess: 0 mmol/L (ref 0.0–2.0)
Acid-Base Excess: 3 mmol/L — ABNORMAL HIGH (ref 0.0–2.0)
Acid-base deficit: 1 mmol/L (ref 0.0–2.0)
Acid-base deficit: 2 mmol/L (ref 0.0–2.0)
Acid-base deficit: 3 mmol/L — ABNORMAL HIGH (ref 0.0–2.0)
Acid-base deficit: 3 mmol/L — ABNORMAL HIGH (ref 0.0–2.0)
Acid-base deficit: 3 mmol/L — ABNORMAL HIGH (ref 0.0–2.0)
Acid-base deficit: 4 mmol/L — ABNORMAL HIGH (ref 0.0–2.0)
Acid-base deficit: 4 mmol/L — ABNORMAL HIGH (ref 0.0–2.0)
Acid-base deficit: 6 mmol/L — ABNORMAL HIGH (ref 0.0–2.0)
Acid-base deficit: 8 mmol/L — ABNORMAL HIGH (ref 0.0–2.0)
Bicarbonate: 18.6 mmol/L — ABNORMAL LOW (ref 20.0–28.0)
Bicarbonate: 19.4 mmol/L — ABNORMAL LOW (ref 20.0–28.0)
Bicarbonate: 22.4 mmol/L (ref 20.0–28.0)
Bicarbonate: 23 mmol/L (ref 20.0–28.0)
Bicarbonate: 23 mmol/L (ref 20.0–28.0)
Bicarbonate: 23.2 mmol/L (ref 20.0–28.0)
Bicarbonate: 23.5 mmol/L (ref 20.0–28.0)
Bicarbonate: 24.6 mmol/L (ref 20.0–28.0)
Bicarbonate: 24.9 mmol/L (ref 20.0–28.0)
Bicarbonate: 25.1 mmol/L (ref 20.0–28.0)
Bicarbonate: 25.6 mmol/L (ref 20.0–28.0)
Bicarbonate: 27.6 mmol/L (ref 20.0–28.0)
Bicarbonate: 27.7 mmol/L (ref 20.0–28.0)
Calcium, Ion: 0.94 mmol/L — ABNORMAL LOW (ref 1.15–1.40)
Calcium, Ion: 0.94 mmol/L — ABNORMAL LOW (ref 1.15–1.40)
Calcium, Ion: 0.99 mmol/L — ABNORMAL LOW (ref 1.15–1.40)
Calcium, Ion: 0.99 mmol/L — ABNORMAL LOW (ref 1.15–1.40)
Calcium, Ion: 1.04 mmol/L — ABNORMAL LOW (ref 1.15–1.40)
Calcium, Ion: 1.04 mmol/L — ABNORMAL LOW (ref 1.15–1.40)
Calcium, Ion: 1.07 mmol/L — ABNORMAL LOW (ref 1.15–1.40)
Calcium, Ion: 1.11 mmol/L — ABNORMAL LOW (ref 1.15–1.40)
Calcium, Ion: 1.17 mmol/L (ref 1.15–1.40)
Calcium, Ion: 1.2 mmol/L (ref 1.15–1.40)
Calcium, Ion: 1.2 mmol/L (ref 1.15–1.40)
Calcium, Ion: 1.21 mmol/L (ref 1.15–1.40)
Calcium, Ion: 1.21 mmol/L (ref 1.15–1.40)
HCT: 17 % — ABNORMAL LOW (ref 39.0–52.0)
HCT: 20 % — ABNORMAL LOW (ref 39.0–52.0)
HCT: 20 % — ABNORMAL LOW (ref 39.0–52.0)
HCT: 25 % — ABNORMAL LOW (ref 39.0–52.0)
HCT: 26 % — ABNORMAL LOW (ref 39.0–52.0)
HCT: 26 % — ABNORMAL LOW (ref 39.0–52.0)
HCT: 27 % — ABNORMAL LOW (ref 39.0–52.0)
HCT: 27 % — ABNORMAL LOW (ref 39.0–52.0)
HCT: 27 % — ABNORMAL LOW (ref 39.0–52.0)
HCT: 29 % — ABNORMAL LOW (ref 39.0–52.0)
HCT: 35 % — ABNORMAL LOW (ref 39.0–52.0)
HCT: 36 % — ABNORMAL LOW (ref 39.0–52.0)
HCT: 45 % (ref 39.0–52.0)
Hemoglobin: 11.9 g/dL — ABNORMAL LOW (ref 13.0–17.0)
Hemoglobin: 12.2 g/dL — ABNORMAL LOW (ref 13.0–17.0)
Hemoglobin: 15.3 g/dL (ref 13.0–17.0)
Hemoglobin: 5.8 g/dL — CL (ref 13.0–17.0)
Hemoglobin: 6.8 g/dL — CL (ref 13.0–17.0)
Hemoglobin: 6.8 g/dL — CL (ref 13.0–17.0)
Hemoglobin: 8.5 g/dL — ABNORMAL LOW (ref 13.0–17.0)
Hemoglobin: 8.8 g/dL — ABNORMAL LOW (ref 13.0–17.0)
Hemoglobin: 8.8 g/dL — ABNORMAL LOW (ref 13.0–17.0)
Hemoglobin: 9.2 g/dL — ABNORMAL LOW (ref 13.0–17.0)
Hemoglobin: 9.2 g/dL — ABNORMAL LOW (ref 13.0–17.0)
Hemoglobin: 9.2 g/dL — ABNORMAL LOW (ref 13.0–17.0)
Hemoglobin: 9.9 g/dL — ABNORMAL LOW (ref 13.0–17.0)
O2 Saturation: 100 %
O2 Saturation: 100 %
O2 Saturation: 100 %
O2 Saturation: 100 %
O2 Saturation: 100 %
O2 Saturation: 100 %
O2 Saturation: 92 %
O2 Saturation: 92 %
O2 Saturation: 95 %
O2 Saturation: 97 %
O2 Saturation: 99 %
O2 Saturation: 99 %
O2 Saturation: 99 %
Patient temperature: 35.1
Patient temperature: 35.8
Patient temperature: 36.3
Patient temperature: 97.7
Potassium: 3.3 mmol/L — ABNORMAL LOW (ref 3.5–5.1)
Potassium: 3.4 mmol/L — ABNORMAL LOW (ref 3.5–5.1)
Potassium: 3.7 mmol/L (ref 3.5–5.1)
Potassium: 3.7 mmol/L (ref 3.5–5.1)
Potassium: 3.8 mmol/L (ref 3.5–5.1)
Potassium: 3.9 mmol/L (ref 3.5–5.1)
Potassium: 4.1 mmol/L (ref 3.5–5.1)
Potassium: 4.4 mmol/L (ref 3.5–5.1)
Potassium: 4.6 mmol/L (ref 3.5–5.1)
Potassium: 4.8 mmol/L (ref 3.5–5.1)
Potassium: 4.8 mmol/L (ref 3.5–5.1)
Potassium: 4.8 mmol/L (ref 3.5–5.1)
Potassium: 6.8 mmol/L (ref 3.5–5.1)
Sodium: 134 mmol/L — ABNORMAL LOW (ref 135–145)
Sodium: 134 mmol/L — ABNORMAL LOW (ref 135–145)
Sodium: 137 mmol/L (ref 135–145)
Sodium: 137 mmol/L (ref 135–145)
Sodium: 138 mmol/L (ref 135–145)
Sodium: 139 mmol/L (ref 135–145)
Sodium: 139 mmol/L (ref 135–145)
Sodium: 140 mmol/L (ref 135–145)
Sodium: 144 mmol/L (ref 135–145)
Sodium: 144 mmol/L (ref 135–145)
Sodium: 144 mmol/L (ref 135–145)
Sodium: 144 mmol/L (ref 135–145)
Sodium: 145 mmol/L (ref 135–145)
TCO2: 20 mmol/L — ABNORMAL LOW (ref 22–32)
TCO2: 21 mmol/L — ABNORMAL LOW (ref 22–32)
TCO2: 24 mmol/L (ref 22–32)
TCO2: 25 mmol/L (ref 22–32)
TCO2: 25 mmol/L (ref 22–32)
TCO2: 25 mmol/L (ref 22–32)
TCO2: 25 mmol/L (ref 22–32)
TCO2: 26 mmol/L (ref 22–32)
TCO2: 26 mmol/L (ref 22–32)
TCO2: 27 mmol/L (ref 22–32)
TCO2: 27 mmol/L (ref 22–32)
TCO2: 29 mmol/L (ref 22–32)
TCO2: 29 mmol/L (ref 22–32)
pCO2 arterial: 35.5 mmHg (ref 32–48)
pCO2 arterial: 36.9 mmHg (ref 32–48)
pCO2 arterial: 39.1 mmHg (ref 32–48)
pCO2 arterial: 41 mmHg (ref 32–48)
pCO2 arterial: 41.2 mmHg (ref 32–48)
pCO2 arterial: 41.3 mmHg (ref 32–48)
pCO2 arterial: 42.4 mmHg (ref 32–48)
pCO2 arterial: 48.6 mmHg — ABNORMAL HIGH (ref 32–48)
pCO2 arterial: 49 mmHg — ABNORMAL HIGH (ref 32–48)
pCO2 arterial: 49.9 mmHg — ABNORMAL HIGH (ref 32–48)
pCO2 arterial: 51.5 mmHg — ABNORMAL HIGH (ref 32–48)
pCO2 arterial: 59.2 mmHg — ABNORMAL HIGH (ref 32–48)
pCO2 arterial: 59.8 mmHg — ABNORMAL HIGH (ref 32–48)
pH, Arterial: 7.233 — ABNORMAL LOW (ref 7.35–7.45)
pH, Arterial: 7.258 — ABNORMAL LOW (ref 7.35–7.45)
pH, Arterial: 7.272 — ABNORMAL LOW (ref 7.35–7.45)
pH, Arterial: 7.272 — ABNORMAL LOW (ref 7.35–7.45)
pH, Arterial: 7.282 — ABNORMAL LOW (ref 7.35–7.45)
pH, Arterial: 7.286 — ABNORMAL LOW (ref 7.35–7.45)
pH, Arterial: 7.288 — ABNORMAL LOW (ref 7.35–7.45)
pH, Arterial: 7.324 — ABNORMAL LOW (ref 7.35–7.45)
pH, Arterial: 7.346 — ABNORMAL LOW (ref 7.35–7.45)
pH, Arterial: 7.369 (ref 7.35–7.45)
pH, Arterial: 7.401 (ref 7.35–7.45)
pH, Arterial: 7.427 (ref 7.35–7.45)
pH, Arterial: 7.458 — ABNORMAL HIGH (ref 7.35–7.45)
pO2, Arterial: 135 mmHg — ABNORMAL HIGH (ref 83–108)
pO2, Arterial: 142 mmHg — ABNORMAL HIGH (ref 83–108)
pO2, Arterial: 147 mmHg — ABNORMAL HIGH (ref 83–108)
pO2, Arterial: 220 mmHg — ABNORMAL HIGH (ref 83–108)
pO2, Arterial: 225 mmHg — ABNORMAL HIGH (ref 83–108)
pO2, Arterial: 235 mmHg — ABNORMAL HIGH (ref 83–108)
pO2, Arterial: 391 mmHg — ABNORMAL HIGH (ref 83–108)
pO2, Arterial: 427 mmHg — ABNORMAL HIGH (ref 83–108)
pO2, Arterial: 430 mmHg — ABNORMAL HIGH (ref 83–108)
pO2, Arterial: 65 mmHg — ABNORMAL LOW (ref 83–108)
pO2, Arterial: 78 mmHg — ABNORMAL LOW (ref 83–108)
pO2, Arterial: 79 mmHg — ABNORMAL LOW (ref 83–108)
pO2, Arterial: 88 mmHg (ref 83–108)

## 2024-01-08 LAB — CBC
HCT: 42.2 % (ref 39.0–52.0)
HCT: 29.7 % — ABNORMAL LOW (ref 39.0–52.0)
HCT: 20.3 % — ABNORMAL LOW (ref 39.0–52.0)
Hemoglobin: 15 g/dL (ref 13.0–17.0)
Hemoglobin: 10.3 g/dL — ABNORMAL LOW (ref 13.0–17.0)
Hemoglobin: 7.2 g/dL — ABNORMAL LOW (ref 13.0–17.0)
MCH: 30.7 pg (ref 26.0–34.0)
MCH: 31.2 pg (ref 26.0–34.0)
MCH: 30.6 pg (ref 26.0–34.0)
MCHC: 35.5 g/dL (ref 30.0–36.0)
MCHC: 34.7 g/dL (ref 30.0–36.0)
MCHC: 35.5 g/dL (ref 30.0–36.0)
MCV: 86.5 fL (ref 80.0–100.0)
MCV: 90 fL (ref 80.0–100.0)
MCV: 86.4 fL (ref 80.0–100.0)
Platelets: 272 10*3/uL (ref 150–400)
Platelets: 67 10*3/uL — ABNORMAL LOW (ref 150–400)
Platelets: 54 10*3/uL — ABNORMAL LOW (ref 150–400)
RBC: 4.88 MIL/uL (ref 4.22–5.81)
RBC: 3.3 MIL/uL — ABNORMAL LOW (ref 4.22–5.81)
RBC: 2.35 MIL/uL — ABNORMAL LOW (ref 4.22–5.81)
RDW: 14.8 % (ref 11.5–15.5)
RDW: 14.5 % (ref 11.5–15.5)
RDW: 14.3 % (ref 11.5–15.5)
WBC: 9.5 10*3/uL (ref 4.0–10.5)
WBC: 17 10*3/uL — ABNORMAL HIGH (ref 4.0–10.5)
WBC: 10.5 10*3/uL (ref 4.0–10.5)
nRBC: 0 % (ref 0.0–0.2)
nRBC: 0 % (ref 0.0–0.2)
nRBC: 0 % (ref 0.0–0.2)

## 2024-01-08 LAB — DIC (DISSEMINATED INTRAVASCULAR COAGULATION)PANEL
D-Dimer, Quant: 1.03 ug{FEU}/mL — ABNORMAL HIGH (ref 0.00–0.50)
D-Dimer, Quant: 0.53 ug{FEU}/mL — ABNORMAL HIGH (ref 0.00–0.50)
Fibrinogen: 153 mg/dL — ABNORMAL LOW (ref 210–475)
Fibrinogen: 131 mg/dL — ABNORMAL LOW (ref 210–475)
INR: 1.1 (ref 0.8–1.2)
INR: 1.2 (ref 0.8–1.2)
Platelets: 66 10*3/uL — ABNORMAL LOW (ref 150–400)
Platelets: 53 10*3/uL — ABNORMAL LOW (ref 150–400)
Prothrombin Time: 14.5 s (ref 11.4–15.2)
Prothrombin Time: 15.9 s — ABNORMAL HIGH (ref 11.4–15.2)
Smear Review: NONE SEEN
Smear Review: NONE SEEN
aPTT: 41 s — ABNORMAL HIGH (ref 24–36)
aPTT: 46 s — ABNORMAL HIGH (ref 24–36)

## 2024-01-08 LAB — COMPREHENSIVE METABOLIC PANEL WITH GFR
ALT: 12 U/L (ref 0–44)
AST: 17 U/L (ref 15–41)
Albumin: 3.5 g/dL (ref 3.5–5.0)
Alkaline Phosphatase: 45 U/L (ref 38–126)
Anion gap: 10 (ref 5–15)
BUN: 9 mg/dL (ref 6–20)
CO2: 23 mmol/L (ref 22–32)
Calcium: 8.6 mg/dL — ABNORMAL LOW (ref 8.9–10.3)
Chloride: 102 mmol/L (ref 98–111)
Creatinine, Ser: 1 mg/dL (ref 0.61–1.24)
GFR, Estimated: >60 mL/min (ref >60)
Glucose, Bld: 97 mg/dL (ref 70–99)
Potassium: 3.5 mmol/L (ref 3.5–5.1)
Sodium: 135 mmol/L (ref 135–145)
Total Bilirubin: 1.6 mg/dL — ABNORMAL HIGH (ref 0.0–1.2)
Total Protein: 7.2 g/dL (ref 6.5–8.1)

## 2024-01-08 LAB — PREPARE FRESH FROZEN PLASMA

## 2024-01-08 LAB — PREPARE PLATELET PHERESIS

## 2024-01-08 LAB — TYPE AND SCREEN: Antibody Screen: NEGATIVE

## 2024-01-08 LAB — FIBRINOGEN
Fibrinogen: 275 mg/dL (ref 210–475)
Fibrinogen: 138 mg/dL — ABNORMAL LOW (ref 210–475)
Fibrinogen: 112 mg/dL — ABNORMAL LOW (ref 210–475)

## 2024-01-08 LAB — GLUCOSE, CAPILLARY
Glucose-Capillary: 181 mg/dL — ABNORMAL HIGH (ref 70–99)
Glucose-Capillary: 193 mg/dL — ABNORMAL HIGH (ref 70–99)
Glucose-Capillary: 193 mg/dL — ABNORMAL HIGH (ref 70–99)
Glucose-Capillary: 194 mg/dL — ABNORMAL HIGH (ref 70–99)

## 2024-01-08 LAB — PREPARE CRYOPRECIPITATE

## 2024-01-08 LAB — PLATELET COUNT
Platelets: 184 10*3/uL (ref 150–400)
Platelets: 90 10*3/uL — ABNORMAL LOW (ref 150–400)

## 2024-01-08 LAB — PREPARE RBC (CROSSMATCH)

## 2024-01-08 LAB — SURGICAL PCR SCREEN
MRSA, PCR: NEGATIVE
Staphylococcus aureus: NEGATIVE

## 2024-01-08 LAB — HEMOGLOBIN AND HEMATOCRIT, BLOOD
HCT: 33.3 % — ABNORMAL LOW (ref 39.0–52.0)
HCT: 24.1 % — ABNORMAL LOW (ref 39.0–52.0)
Hemoglobin: 11.7 g/dL — ABNORMAL LOW (ref 13.0–17.0)
Hemoglobin: 8.4 g/dL — ABNORMAL LOW (ref 13.0–17.0)

## 2024-01-08 LAB — APTT: aPTT: 40 s — ABNORMAL HIGH (ref 24–36)

## 2024-01-08 LAB — PROTIME-INR
INR: 1 (ref 0.8–1.2)
Prothrombin Time: 14.1 s (ref 11.4–15.2)

## 2024-01-08 SURGERY — REPAIR, ANEURYSM, AORTA, THORACIC, ASCENDING
Anesthesia: General

## 2024-01-08 SURGERY — EXPLORATION POST OPERATIVE OPEN HEART
Anesthesia: General | Site: Chest

## 2024-01-08 MED ORDER — LACTATED RINGERS IV SOLN: INTRAVENOUS | Status: DC | PRN

## 2024-01-08 MED ORDER — CALCIUM CHLORIDE 10 % IV SOLN
INTRAVENOUS | Status: DC | PRN
  Administered 2024-01-08 (×5): 250 mg via INTRAVENOUS

## 2024-01-08 MED ORDER — TRAMADOL HCL 50 MG PO TABS
50.0000 mg | ORAL_TABLET | ORAL | Status: DC | PRN
  Administered 2024-01-10 – 2024-01-13 (×7): 100 mg via ORAL
  Filled 2024-01-08 (×7): qty 2

## 2024-01-08 MED ORDER — SODIUM CHLORIDE 0.9 % IV SOLN
4.0000 g | Freq: Once | INTRAVENOUS | Status: AC
  Administered 2024-01-08: 4 g via INTRAVENOUS
  Filled 2024-01-08: qty 40

## 2024-01-08 MED ORDER — CHLORHEXIDINE GLUCONATE 0.12 % MT SOLN
OROMUCOSAL | Status: AC
  Filled 2024-01-08: qty 15

## 2024-01-08 MED ORDER — VASOPRESSIN 20 UNITS/100 ML INFUSION FOR SHOCK
INTRAVENOUS | Status: DC | PRN
  Administered 2024-01-08: .03 [IU]/min via INTRAVENOUS

## 2024-01-08 MED ORDER — BISACODYL 10 MG RE SUPP
10.0000 mg | Freq: Every day | RECTAL | Status: DC
  Administered 2024-01-09 – 2024-01-10 (×2): 10 mg via RECTAL
  Filled 2024-01-08 (×2): qty 1

## 2024-01-08 MED ORDER — NOREPINEPHRINE 16 MG/250ML-% IV SOLN
0.0000 ug/min | INTRAVENOUS | Status: DC
  Filled 2024-01-08 (×3): qty 250

## 2024-01-08 MED ORDER — SODIUM CHLORIDE 0.9% FLUSH
3.0000 mL | Freq: Two times a day (BID) | INTRAVENOUS | Status: DC
  Administered 2024-01-09 – 2024-01-10 (×4): 3 mL via INTRAVENOUS

## 2024-01-08 MED ORDER — ROCURONIUM BROMIDE 10 MG/ML (PF) SYRINGE
PREFILLED_SYRINGE | INTRAVENOUS | Status: AC
  Filled 2024-01-08: qty 10

## 2024-01-08 MED ORDER — ETOMIDATE 2 MG/ML IV SOLN
INTRAVENOUS | Status: DC | PRN
  Administered 2024-01-08: 20 mg via INTRAVENOUS

## 2024-01-08 MED ORDER — ORAL CARE MOUTH RINSE: 15.0000 mL | Freq: Once | OROMUCOSAL | Status: DC

## 2024-01-08 MED ORDER — PHENYLEPHRINE 80 MCG/ML (10ML) SYRINGE FOR IV PUSH (FOR BLOOD PRESSURE SUPPORT)
PREFILLED_SYRINGE | INTRAVENOUS | Status: DC | PRN
  Administered 2024-01-08: 40 ug via INTRAVENOUS
  Administered 2024-01-08: 80 ug via INTRAVENOUS
  Administered 2024-01-08 (×2): 40 ug via INTRAVENOUS

## 2024-01-08 MED ORDER — SODIUM CHLORIDE 0.9% IV SOLUTION: Freq: Once | INTRAVENOUS | Status: DC

## 2024-01-08 MED ORDER — PHENYLEPHRINE CONCENTRATED 100MG/250ML (0.4 MG/ML) INFUSION SIMPLE
0.0000 ug/min | INTRAVENOUS | Status: DC
  Filled 2024-01-08 (×2): qty 250

## 2024-01-08 MED ORDER — DEXTROSE 50 % IV SOLN: 0.0000 mL | INTRAVENOUS | Status: DC | PRN

## 2024-01-08 MED ORDER — METOCLOPRAMIDE HCL 5 MG/ML IJ SOLN
10.0000 mg | Freq: Four times a day (QID) | INTRAMUSCULAR | Status: AC
  Administered 2024-01-09 – 2024-01-10 (×3): 10 mg via INTRAVENOUS
  Filled 2024-01-08 (×3): qty 2

## 2024-01-08 MED ORDER — HEMOSTATIC AGENTS (NO CHARGE) OPTIME
TOPICAL | Status: DC | PRN
  Administered 2024-01-08 (×5): 1 via TOPICAL

## 2024-01-08 MED ORDER — HEPARIN SODIUM (PORCINE) 1000 UNIT/ML IJ SOLN
INTRAMUSCULAR | Status: AC
  Filled 2024-01-08: qty 1

## 2024-01-08 MED ORDER — METHYLPREDNISOLONE SODIUM SUCC 125 MG IJ SOLR
INTRAMUSCULAR | Status: DC | PRN
  Administered 2024-01-08: 125 mg via INTRAVENOUS

## 2024-01-08 MED ORDER — ROCURONIUM BROMIDE 10 MG/ML (PF) SYRINGE
PREFILLED_SYRINGE | INTRAVENOUS | Status: DC | PRN
  Administered 2024-01-08: 30 mg via INTRAVENOUS
  Administered 2024-01-08: 70 mg via INTRAVENOUS
  Administered 2024-01-08 (×2): 50 mg via INTRAVENOUS
  Administered 2024-01-08: 100 mg via INTRAVENOUS

## 2024-01-08 MED ORDER — PROTAMINE SULFATE 10 MG/ML IV SOLN
25.0000 mg | Freq: Once | INTRAVENOUS | Status: AC
  Administered 2024-01-08: 25 mg via INTRAVENOUS
  Filled 2024-01-08: qty 5

## 2024-01-08 MED ORDER — DEXMEDETOMIDINE HCL IN NACL 400 MCG/100ML IV SOLN
INTRAVENOUS | Status: AC
  Filled 2024-01-08: qty 100

## 2024-01-08 MED ORDER — VASOPRESSIN 20 UNIT/ML IV SOLN
INTRAVENOUS | Status: AC
Start: 2024-01-08 — End: 2024-01-08
  Filled 2024-01-08: qty 1

## 2024-01-08 MED ORDER — AMIODARONE HCL IN DEXTROSE 360-4.14 MG/200ML-% IV SOLN
30.0000 mg/h | INTRAVENOUS | Status: DC
  Administered 2024-01-09 – 2024-01-12 (×6): 30 mg/h via INTRAVENOUS
  Filled 2024-01-08 (×7): qty 200

## 2024-01-08 MED ORDER — METOPROLOL TARTRATE 12.5 MG HALF TABLET
12.5000 mg | ORAL_TABLET | Freq: Two times a day (BID) | ORAL | Status: DC
Start: 2024-01-08 — End: 2024-01-11
  Administered 2024-01-10: 12.5 mg via ORAL
  Filled 2024-01-08: qty 1

## 2024-01-08 MED ORDER — CHLORHEXIDINE GLUCONATE 0.12 % MT SOLN: 15.0000 mL | Freq: Once | OROMUCOSAL | Status: DC

## 2024-01-08 MED ORDER — SODIUM CHLORIDE 0.9% IV SOLUTION: Freq: Once | INTRAVENOUS | Status: AC

## 2024-01-08 MED ORDER — FENTANYL CITRATE (PF) 250 MCG/5ML IJ SOLN
INTRAMUSCULAR | Status: AC
  Filled 2024-01-08: qty 5

## 2024-01-08 MED ORDER — METOPROLOL TARTRATE 5 MG/5ML IV SOLN
2.5000 mg | INTRAVENOUS | Status: DC | PRN
  Administered 2024-01-10 – 2024-01-11 (×3): 5 mg via INTRAVENOUS
  Filled 2024-01-08 (×4): qty 5

## 2024-01-08 MED ORDER — MILRINONE LACTATE IN DEXTROSE 20-5 MG/100ML-% IV SOLN
0.1250 ug/kg/min | INTRAVENOUS | Status: DC
  Administered 2024-01-09 – 2024-01-10 (×2): 0.125 ug/kg/min via INTRAVENOUS
  Filled 2024-01-08: qty 100

## 2024-01-08 MED ORDER — DEXMEDETOMIDINE HCL IN NACL 400 MCG/100ML IV SOLN
0.0000 ug/kg/h | INTRAVENOUS | Status: DC
  Administered 2024-01-09 – 2024-01-10 (×5): 0.7 ug/kg/h via INTRAVENOUS
  Filled 2024-01-08 (×3): qty 100

## 2024-01-08 MED ORDER — PROTAMINE SULFATE 10 MG/ML IV SOLN
INTRAVENOUS | Status: AC
  Filled 2024-01-08: qty 25

## 2024-01-08 MED ORDER — MIDAZOLAM HCL 2 MG/2ML IJ SOLN
2.0000 mg | INTRAMUSCULAR | Status: DC | PRN
  Administered 2024-01-08 – 2024-01-10 (×4): 2 mg via INTRAVENOUS
  Filled 2024-01-08 (×5): qty 2

## 2024-01-08 MED ORDER — MAGNESIUM SULFATE 4 GM/100ML IV SOLN
4.0000 g | Freq: Once | INTRAVENOUS | Status: AC
  Administered 2024-01-08: 4 g via INTRAVENOUS
  Filled 2024-01-08: qty 100

## 2024-01-08 MED ORDER — SODIUM CHLORIDE (PF) 0.9 % IJ SOLN: OROMUCOSAL | Status: DC | PRN

## 2024-01-08 MED ORDER — EPINEPHRINE HCL 5 MG/250ML IV SOLN IN NS
0.0000 ug/min | INTRAVENOUS | Status: DC
  Administered 2024-01-09: 4 ug/min via INTRAVENOUS
  Filled 2024-01-08 (×2): qty 250

## 2024-01-08 MED ORDER — SODIUM CHLORIDE 0.9 % IV SOLN: 250.0000 mL | INTRAVENOUS | Status: AC

## 2024-01-08 MED ORDER — SODIUM CHLORIDE 0.45 % IV SOLN: INTRAVENOUS | Status: AC | PRN

## 2024-01-08 MED ORDER — ASPIRIN 325 MG PO TBEC
325.0000 mg | DELAYED_RELEASE_TABLET | Freq: Every day | ORAL | Status: DC
  Administered 2024-01-11 – 2024-01-13 (×3): 325 mg via ORAL
  Filled 2024-01-08 (×3): qty 1

## 2024-01-08 MED ORDER — NOREPINEPHRINE 16 MG/250ML-% IV SOLN
0.0000 ug/min | INTRAVENOUS | Status: DC
  Administered 2024-01-08: 5 ug/min via INTRAVENOUS
  Filled 2024-01-08: qty 250

## 2024-01-08 MED ORDER — POTASSIUM CHLORIDE 10 MEQ/50ML IV SOLN
10.0000 meq | INTRAVENOUS | Status: AC
  Administered 2024-01-08 – 2024-01-09 (×3): 10 meq via INTRAVENOUS

## 2024-01-08 MED ORDER — PROPOFOL 10 MG/ML IV BOLUS
INTRAVENOUS | Status: DC | PRN
Start: 2024-01-08 — End: 2024-01-09
  Administered 2024-01-08: 100 mg via INTRAVENOUS

## 2024-01-08 MED ORDER — LACTATED RINGERS IV SOLN: INTRAVENOUS | Status: DC

## 2024-01-08 MED ORDER — PANTOPRAZOLE SODIUM 40 MG IV SOLR
40.0000 mg | Freq: Every day | INTRAVENOUS | Status: DC
  Administered 2024-01-09: 40 mg via INTRAVENOUS
  Filled 2024-01-08: qty 10

## 2024-01-08 MED ORDER — CHLORHEXIDINE GLUCONATE CLOTH 2 % EX PADS
6.0000 | MEDICATED_PAD | Freq: Every day | CUTANEOUS | Status: DC
  Administered 2024-01-10 – 2024-01-11 (×2): 6 via TOPICAL

## 2024-01-08 MED ORDER — CALCIUM CHLORIDE 10 % IV SOLN
INTRAVENOUS | Status: DC | PRN
  Administered 2024-01-08: 200 mg via INTRAVENOUS
  Administered 2024-01-08: 500 mg via INTRAVENOUS
  Administered 2024-01-08: 600 mg via INTRAVENOUS
  Administered 2024-01-08: 200 mg via INTRAVENOUS

## 2024-01-08 MED ORDER — ACETAMINOPHEN 160 MG/5ML PO SOLN: 650.0000 mg | Freq: Once | ORAL | Status: DC

## 2024-01-08 MED ORDER — ORAL CARE MOUTH RINSE: 15.0000 mL | OROMUCOSAL | Status: DC | PRN

## 2024-01-08 MED ORDER — PHENYLEPHRINE HCL-NACL 20-0.9 MG/250ML-% IV SOLN: 0.0000 ug/min | INTRAVENOUS | Status: DC

## 2024-01-08 MED ORDER — AMIODARONE HCL IN DEXTROSE 360-4.14 MG/200ML-% IV SOLN: 60.0000 mg/h | INTRAVENOUS | Status: AC

## 2024-01-08 MED ORDER — SODIUM CHLORIDE 0.9% IV SOLUTION
Freq: Once | INTRAVENOUS | Status: DC
Start: 2024-01-08 — End: 2024-01-10

## 2024-01-08 MED ORDER — VASOPRESSIN 20 UNITS/100 ML INFUSION FOR SHOCK
0.0000 [IU]/min | INTRAVENOUS | Status: DC
  Administered 2024-01-09 (×2): 0.04 [IU]/min via INTRAVENOUS
  Administered 2024-01-10: 0.02 [IU]/min via INTRAVENOUS
  Filled 2024-01-08 (×3): qty 100
  Filled 2024-01-08: qty 200

## 2024-01-08 MED ORDER — INSULIN REGULAR(HUMAN) IN NACL 100-0.9 UT/100ML-% IV SOLN
INTRAVENOUS | Status: DC
  Administered 2024-01-08: 2.2 [IU]/h via INTRAVENOUS
  Filled 2024-01-08: qty 100

## 2024-01-08 MED ORDER — SODIUM CHLORIDE 0.9 % IV SOLN: 10.0000 mL/h | Freq: Once | INTRAVENOUS | Status: DC

## 2024-01-08 MED ORDER — NOREPINEPHRINE 4 MG/250ML-% IV SOLN
INTRAVENOUS | Status: AC
  Filled 2024-01-08: qty 250

## 2024-01-08 MED ORDER — CEFAZOLIN SODIUM-DEXTROSE 2-4 GM/100ML-% IV SOLN
2.0000 g | Freq: Three times a day (TID) | INTRAVENOUS | Status: DC
Start: 2024-01-08 — End: 2024-01-09

## 2024-01-08 MED ORDER — PHENYLEPHRINE HCL-NACL 20-0.9 MG/250ML-% IV SOLN
0.0000 ug/min | INTRAVENOUS | Status: DC
  Filled 2024-01-08: qty 250

## 2024-01-08 MED ORDER — OXYCODONE HCL 5 MG PO TABS
5.0000 mg | ORAL_TABLET | ORAL | Status: DC | PRN
  Administered 2024-01-10: 5 mg via ORAL
  Administered 2024-01-10 – 2024-01-12 (×3): 10 mg via ORAL
  Filled 2024-01-08 (×5): qty 2

## 2024-01-08 MED ORDER — PAPAVERINE HCL 30 MG/ML IJ SOLN
INTRAMUSCULAR | Status: DC | PRN
  Administered 2024-01-08: 500 mL via INTRAVASCULAR

## 2024-01-08 MED ORDER — BISACODYL 5 MG PO TBEC
10.0000 mg | DELAYED_RELEASE_TABLET | Freq: Every day | ORAL | Status: DC
  Administered 2024-01-11 – 2024-01-13 (×2): 10 mg via ORAL
  Filled 2024-01-08 (×3): qty 2

## 2024-01-08 MED ORDER — HEPARIN SODIUM (PORCINE) 1000 UNIT/ML IJ SOLN
INTRAMUSCULAR | Status: DC | PRN
Start: 2024-01-08 — End: 2024-01-09
  Administered 2024-01-08: 32000 [IU] via INTRAVENOUS
  Administered 2024-01-08: 5000 [IU] via INTRAVENOUS

## 2024-01-08 MED ORDER — CHLORHEXIDINE GLUCONATE 0.12 % MT SOLN
15.0000 mL | OROMUCOSAL | Status: AC
  Administered 2024-01-08: 15 mL via OROMUCOSAL
  Filled 2024-01-08: qty 15

## 2024-01-08 MED ORDER — ORAL CARE MOUTH RINSE
15.0000 mL | OROMUCOSAL | Status: DC | PRN
Start: 2024-01-08 — End: 2024-01-10

## 2024-01-08 MED ORDER — HEMOSTATIC AGENTS (NO CHARGE) OPTIME
TOPICAL | Status: DC | PRN
  Administered 2024-01-08: 1 via TOPICAL

## 2024-01-08 MED ORDER — SODIUM BICARBONATE 8.4 % IV SOLN
100.0000 meq | Freq: Once | INTRAVENOUS | Status: AC
  Administered 2024-01-08: 100 meq via INTRAVENOUS

## 2024-01-08 MED ORDER — SODIUM BICARBONATE 8.4 % IV SOLN
INTRAVENOUS | Status: AC
  Administered 2024-01-08: 100 meq via INTRAVENOUS
  Filled 2024-01-08: qty 150

## 2024-01-08 MED ORDER — PROTAMINE SULFATE 10 MG/ML IV SOLN
INTRAVENOUS | Status: DC | PRN
  Administered 2024-01-08: 100 mg via INTRAVENOUS
  Administered 2024-01-08: 50 mg via INTRAVENOUS
  Administered 2024-01-08: 350 mg via INTRAVENOUS

## 2024-01-08 MED ORDER — ACETAMINOPHEN 500 MG PO TABS
1000.0000 mg | ORAL_TABLET | Freq: Four times a day (QID) | ORAL | Status: AC
  Administered 2024-01-10 – 2024-01-13 (×10): 1000 mg via ORAL
  Filled 2024-01-08 (×12): qty 2

## 2024-01-08 MED ORDER — PROPOFOL 1000 MG/100ML IV EMUL
0.0000 ug/kg/min | INTRAVENOUS | Status: DC
  Administered 2024-01-09 (×2): 35 ug/kg/min via INTRAVENOUS
  Administered 2024-01-09: 5 ug/kg/min via INTRAVENOUS
  Administered 2024-01-09: 35 ug/kg/min via INTRAVENOUS
  Administered 2024-01-10 (×2): 45 ug/kg/min via INTRAVENOUS
  Administered 2024-01-10: 50 ug/kg/min via INTRAVENOUS
  Filled 2024-01-08 (×6): qty 100

## 2024-01-08 MED ORDER — NITROGLYCERIN IN D5W 200-5 MCG/ML-% IV SOLN: 0.0000 ug/min | INTRAVENOUS | Status: DC

## 2024-01-08 MED ORDER — MORPHINE SULFATE (PF) 2 MG/ML IV SOLN
1.0000 mg | INTRAVENOUS | Status: DC | PRN
  Administered 2024-01-09 – 2024-01-11 (×6): 4 mg via INTRAVENOUS
  Filled 2024-01-08 (×8): qty 2

## 2024-01-08 MED ORDER — SODIUM BICARBONATE 8.4 % IV SOLN: 100.0000 meq | Freq: Once | INTRAVENOUS | Status: AC

## 2024-01-08 MED ORDER — MIDAZOLAM HCL (PF) 5 MG/ML IJ SOLN
INTRAMUSCULAR | Status: DC | PRN
  Administered 2024-01-08: 2 mg via INTRAVENOUS
  Administered 2024-01-08: 3 mg via INTRAVENOUS
  Administered 2024-01-08 (×3): 1 mg via INTRAVENOUS
  Administered 2024-01-08: 2 mg via INTRAVENOUS

## 2024-01-08 MED ORDER — PHENYLEPHRINE CONCENTRATED 100MG/250ML (0.4 MG/ML) INFUSION SIMPLE
0.0000 ug/min | INTRAVENOUS | Status: DC
  Administered 2024-01-08: 75 ug/min via INTRAVENOUS
  Filled 2024-01-08: qty 250

## 2024-01-08 MED ORDER — EPINEPHRINE HCL 5 MG/250ML IV SOLN IN NS
INTRAVENOUS | Status: AC
  Filled 2024-01-08: qty 250

## 2024-01-08 MED ORDER — 0.9 % SODIUM CHLORIDE (POUR BTL) OPTIME
TOPICAL | Status: DC | PRN
  Administered 2024-01-08: 5000 mL

## 2024-01-08 MED ORDER — NITROGLYCERIN 0.2 MG/ML ON CALL CATH LAB
INTRAVENOUS | Status: DC | PRN
Start: 2024-01-08 — End: 2024-01-09
  Administered 2024-01-08 (×6): 20 ug via INTRAVENOUS

## 2024-01-08 MED ORDER — SODIUM BICARBONATE 8.4 % IV SOLN
INTRAVENOUS | Status: AC
  Filled 2024-01-08: qty 200

## 2024-01-08 MED ORDER — COAGULATION FACTOR VIIA RECOMB 1 MG IV SOLR
90.0000 ug/kg | Freq: Once | INTRAVENOUS | Status: AC
  Administered 2024-01-08: 10000 ug via INTRAVENOUS
  Filled 2024-01-08: qty 10

## 2024-01-08 MED ORDER — SODIUM BICARBONATE 8.4 % IV SOLN
INTRAVENOUS | Status: DC | PRN
  Administered 2024-01-08: 50 meq via INTRAVENOUS

## 2024-01-08 MED ORDER — FENTANYL CITRATE (PF) 250 MCG/5ML IJ SOLN
INTRAMUSCULAR | Status: DC | PRN
  Administered 2024-01-08: 100 ug via INTRAVENOUS
  Administered 2024-01-08: 150 ug via INTRAVENOUS
  Administered 2024-01-08: 300 ug via INTRAVENOUS
  Administered 2024-01-08: 100 ug via INTRAVENOUS
  Administered 2024-01-08: 50 ug via INTRAVENOUS
  Administered 2024-01-08 (×2): 150 ug via INTRAVENOUS
  Administered 2024-01-08: 100 ug via INTRAVENOUS
  Administered 2024-01-08: 150 ug via INTRAVENOUS

## 2024-01-08 MED ORDER — ORAL CARE MOUTH RINSE
15.0000 mL | OROMUCOSAL | Status: DC
  Administered 2024-01-08 – 2024-01-10 (×17): 15 mL via OROMUCOSAL

## 2024-01-08 MED ORDER — SODIUM CHLORIDE 0.9 % IV SOLN: INTRAVENOUS | Status: AC

## 2024-01-08 MED ORDER — ROCURONIUM BROMIDE 100 MG/10ML IV SOLN
INTRAVENOUS | Status: DC | PRN
  Administered 2024-01-08: 50 mg via INTRAVENOUS
  Administered 2024-01-08: 100 mg via INTRAVENOUS

## 2024-01-08 MED ORDER — HEPARIN SODIUM (PORCINE) 1000 UNIT/ML IJ SOLN
INTRAMUSCULAR | Status: AC
  Filled 2024-01-08: qty 10

## 2024-01-08 MED ORDER — 0.9 % SODIUM CHLORIDE (POUR BTL) OPTIME
TOPICAL | Status: DC | PRN
  Administered 2024-01-08 (×2): 1000 mL
  Administered 2024-01-08: 5000 mL
  Administered 2024-01-08: 1000 mL

## 2024-01-08 MED ORDER — PANTOPRAZOLE SODIUM 40 MG PO TBEC: 40.0000 mg | DELAYED_RELEASE_TABLET | Freq: Every day | ORAL | Status: DC

## 2024-01-08 MED ORDER — ASPIRIN 81 MG PO CHEW
324.0000 mg | CHEWABLE_TABLET | Freq: Every day | ORAL | Status: DC
  Administered 2024-01-09 – 2024-01-10 (×2): 324 mg
  Filled 2024-01-08 (×2): qty 4

## 2024-01-08 MED ORDER — PROTAMINE SULFATE 10 MG/ML IV SOLN
INTRAVENOUS | Status: AC
Start: 2024-01-08 — End: 2024-01-08
  Filled 2024-01-08: qty 15

## 2024-01-08 MED ORDER — VASOPRESSIN 20 UNIT/ML IV SOLN
INTRAVENOUS | Status: DC | PRN
  Administered 2024-01-08: 1 [IU] via INTRAVENOUS
  Administered 2024-01-08 (×3): 2 [IU] via INTRAVENOUS
  Administered 2024-01-08: 1 [IU] via INTRAVENOUS
  Administered 2024-01-08: 2 [IU] via INTRAVENOUS
  Administered 2024-01-08 (×2): 1 [IU] via INTRAVENOUS

## 2024-01-08 MED ORDER — POTASSIUM CHLORIDE 10 MEQ/50ML IV SOLN: 10.0000 meq | INTRAVENOUS | Status: AC

## 2024-01-08 MED ORDER — PHENYLEPHRINE HCL (PRESSORS) 10 MG/ML IV SOLN
INTRAVENOUS | Status: AC
  Filled 2024-01-08: qty 1

## 2024-01-08 MED ORDER — PHENYLEPHRINE 80 MCG/ML (10ML) SYRINGE FOR IV PUSH (FOR BLOOD PRESSURE SUPPORT)
PREFILLED_SYRINGE | INTRAVENOUS | Status: AC
  Filled 2024-01-08: qty 10

## 2024-01-08 MED ORDER — METOPROLOL TARTRATE 25 MG/10 ML ORAL SUSPENSION
12.5000 mg | Freq: Two times a day (BID) | ORAL | Status: DC
  Administered 2024-01-09: 12.5 mg
  Filled 2024-01-08: qty 10

## 2024-01-08 MED ORDER — LACTATED RINGERS IV SOLN: INTRAVENOUS | Status: AC

## 2024-01-08 MED ORDER — ALBUMIN HUMAN 5 % IV SOLN
INTRAVENOUS | Status: AC
  Filled 2024-01-08: qty 750

## 2024-01-08 MED ORDER — MIDAZOLAM HCL (PF) 10 MG/2ML IJ SOLN
INTRAMUSCULAR | Status: AC
  Filled 2024-01-08: qty 2

## 2024-01-08 MED ORDER — ASPIRIN 81 MG PO CHEW
324.0000 mg | CHEWABLE_TABLET | Freq: Once | ORAL | Status: DC
  Filled 2024-01-08: qty 4

## 2024-01-08 MED ORDER — ALBUMIN HUMAN 5 % IV SOLN
250.0000 mL | INTRAVENOUS | Status: AC | PRN
  Administered 2024-01-08 (×7): 12.5 g via INTRAVENOUS
  Filled 2024-01-08 (×2): qty 250

## 2024-01-08 MED ORDER — AMIODARONE LOAD VIA INFUSION
150.0000 mg | Freq: Once | INTRAVENOUS | Status: AC
  Administered 2024-01-08: 150 mg via INTRAVENOUS
  Filled 2024-01-08: qty 83.34

## 2024-01-08 MED ORDER — VANCOMYCIN HCL IN DEXTROSE 1-5 GM/200ML-% IV SOLN
1000.0000 mg | Freq: Once | INTRAVENOUS | Status: DC
  Filled 2024-01-08: qty 200

## 2024-01-08 MED ORDER — ALBUMIN HUMAN 5 % IV SOLN: INTRAVENOUS | Status: DC | PRN

## 2024-01-08 MED ORDER — ONDANSETRON HCL 4 MG/2ML IJ SOLN: 4.0000 mg | Freq: Four times a day (QID) | INTRAMUSCULAR | Status: DC | PRN

## 2024-01-08 MED ORDER — SODIUM CHLORIDE 0.9% FLUSH: 3.0000 mL | INTRAVENOUS | Status: DC | PRN

## 2024-01-08 MED ORDER — HEMOSTATIC AGENTS (NO CHARGE) OPTIME
TOPICAL | Status: DC | PRN
Start: 2024-01-08 — End: 2024-01-08
  Administered 2024-01-08: 1 via TOPICAL

## 2024-01-08 MED ORDER — NOREPINEPHRINE 4 MG/250ML-% IV SOLN: 0.0000 ug/min | INTRAVENOUS | Status: DC

## 2024-01-08 MED ORDER — HEMOSTATIC AGENTS (NO CHARGE) OPTIME
TOPICAL | Status: DC | PRN
Start: 2024-01-08 — End: 2024-01-08
  Administered 2024-01-08 (×3): 1 via TOPICAL

## 2024-01-08 MED ORDER — CEFAZOLIN SODIUM-DEXTROSE 2-3 GM-%(50ML) IV SOLR
INTRAVENOUS | Status: DC | PRN
  Administered 2024-01-08: 2 g via INTRAVENOUS

## 2024-01-08 MED ORDER — PROPOFOL 10 MG/ML IV BOLUS
INTRAVENOUS | Status: AC
  Filled 2024-01-08: qty 20

## 2024-01-08 MED ORDER — ACETAMINOPHEN 160 MG/5ML PO SOLN
1000.0000 mg | Freq: Four times a day (QID) | ORAL | Status: AC
Start: 2024-01-09 — End: 2024-01-13
  Administered 2024-01-09 – 2024-01-10 (×5): 1000 mg
  Filled 2024-01-08 (×4): qty 40.6

## 2024-01-08 MED ORDER — DOCUSATE SODIUM 100 MG PO CAPS
200.0000 mg | ORAL_CAPSULE | Freq: Every day | ORAL | Status: DC
Start: 2024-01-09 — End: 2024-01-10

## 2024-01-08 MED ORDER — ETOMIDATE 2 MG/ML IV SOLN
INTRAVENOUS | Status: AC
  Filled 2024-01-08: qty 10

## 2024-01-08 MED ORDER — ALBUMIN HUMAN 5 % IV SOLN
INTRAVENOUS | Status: AC
  Administered 2024-01-08: 12.5 g via INTRAVENOUS
  Filled 2024-01-08: qty 750

## 2024-01-08 MED ORDER — AMIODARONE HCL IN DEXTROSE 360-4.14 MG/200ML-% IV SOLN
INTRAVENOUS | Status: AC
  Administered 2024-01-08: 60 mg/h
  Filled 2024-01-08: qty 400

## 2024-01-08 MED ORDER — NOREPINEPHRINE 4 MG/250ML-% IV SOLN
0.0000 ug/min | INTRAVENOUS | Status: DC
  Filled 2024-01-08: qty 250

## 2024-01-08 SURGICAL SUPPLY — 64 items
ADAPTER CARDIO PERF ANTE/RETRO (ADAPTER) ×1 IMPLANT
BAG DECANTER FOR FLEXI CONT (MISCELLANEOUS) ×1 IMPLANT
BENZOIN TINCTURE PRP APPL 2/3 (GAUZE/BANDAGES/DRESSINGS) IMPLANT
BLADE CLIPPER SURG (BLADE) ×1 IMPLANT
BLADE STERNUM SYSTEM 6 (BLADE) ×1 IMPLANT
BLADE SURG 15 STRL LF DISP TIS (BLADE) ×1 IMPLANT
CANISTER SUCTION 3000ML PPV (SUCTIONS) ×2 IMPLANT
CANNULA EZ GLIDE AORTIC 21FR (CANNULA) IMPLANT
CANNULA MC2 2 STG 36/46 NON-V (CANNULA) IMPLANT
CANNULA PRFSN .5XCNCT 15X34-48 (MISCELLANEOUS) IMPLANT
CANNULA SUMP PERICARDIAL (CANNULA) ×1 IMPLANT
CATH THORACIC 28FR (CATHETERS) IMPLANT
CATH THORACIC 36FR RT ANG (CATHETERS) IMPLANT
CAUTERY EYE LOW TEMP 1300F FIN (OPHTHALMIC RELATED) ×1 IMPLANT
CLIP TI MEDIUM 6 (CLIP) ×1 IMPLANT
CLIP TI WIDE RED SMALL 24 (CLIP) IMPLANT
CONN 3/8X3/8 GISH STERILE (MISCELLANEOUS) IMPLANT
CONN Y 3/8X3/8X3/8 BEN (MISCELLANEOUS) IMPLANT
DRAPE SRG 135X102X78XABS (DRAPES) ×2 IMPLANT
DRAPE WARM FLUID 44X44 (DRAPES) ×2 IMPLANT
DRSG COVADERM 4X14 (GAUZE/BANDAGES/DRESSINGS) ×2 IMPLANT
ELECTRODE REM PT RTRN 9FT ADLT (ELECTROSURGICAL) ×4 IMPLANT
FELT TEFLON 6X6 (MISCELLANEOUS) ×2 IMPLANT
GAUZE 4X4 16PLY ~~LOC~~+RFID DBL (SPONGE) ×1 IMPLANT
GAUZE SPONGE 4X4 12PLY STRL (GAUZE/BANDAGES/DRESSINGS) ×3 IMPLANT
GLOVE SS BIOGEL STRL SZ 7.5 (GLOVE) ×4 IMPLANT
GOWN STRL REUS W/ TWL LRG LVL3 (GOWN DISPOSABLE) ×7 IMPLANT
HEMOSTAT SURGICEL 2X14 (HEMOSTASIS) IMPLANT
INSERT FOGARTY SM (MISCELLANEOUS) ×1 IMPLANT
KIT BASIN OR (CUSTOM PROCEDURE TRAY) ×2 IMPLANT
KIT SUCTION CATH 14FR (SUCTIONS) ×2 IMPLANT
KIT TURNOVER KIT B (KITS) ×2 IMPLANT
LINE VENT (MISCELLANEOUS) ×1 IMPLANT
NDL AORTIC AIR ASPIRATING (NEEDLE) IMPLANT
NEEDLE AORTIC AIR ASPIRATING (NEEDLE) IMPLANT
NS IRRIG 1000ML POUR BTL (IV SOLUTION) ×9 IMPLANT
PACK E OPEN HEART (SUTURE) ×2 IMPLANT
PACK OPEN HEART (CUSTOM PROCEDURE TRAY) ×2 IMPLANT
PAD ARMBOARD POSITIONER FOAM (MISCELLANEOUS) ×4 IMPLANT
POSITIONER HEAD DONUT 9IN (MISCELLANEOUS) ×1 IMPLANT
SEALANT PATCH FIBRIN 2X4IN (MISCELLANEOUS) ×3 IMPLANT
SEALANT SURG COSEAL 8ML (VASCULAR PRODUCTS) ×1 IMPLANT
SET MPS 3-ND DEL (MISCELLANEOUS) ×1 IMPLANT
SPONGE T-LAP 18X18 ~~LOC~~+RFID (SPONGE) ×6 IMPLANT
SPONGE T-LAP 4X18 ~~LOC~~+RFID (SPONGE) ×2 IMPLANT
STAPLER SKIN PROX 35W (STAPLE) ×1 IMPLANT
STOPCOCK 4 WAY LG BORE MALE ST (IV SETS) IMPLANT
SUT PROLENE 3 0 SH DA (SUTURE) ×3 IMPLANT
SUT PROLENE 4 0 SH DA (SUTURE) ×12 IMPLANT
SUT PROLENE 4-0 RB1 .5 CRCL 36 (SUTURE) ×12 IMPLANT
SUT STEEL 6MS V (SUTURE) ×1 IMPLANT
SUT STEEL SZ 6 DBL 3X14 BALL (SUTURE) ×1 IMPLANT
SUT VIC AB 1 CTX 27 (SUTURE) ×4 IMPLANT
SUT VIC AB 2-0 CTX 36 (SUTURE) ×4 IMPLANT
SYSTEM SAHARA CHEST DRAIN ATS (WOUND CARE) ×2 IMPLANT
TAPE CLOTH SURG 4X10 WHT LF (GAUZE/BANDAGES/DRESSINGS) ×1 IMPLANT
TAPE PAPER 2X10 WHT MICROPORE (GAUZE/BANDAGES/DRESSINGS) ×1 IMPLANT
TOWEL GREEN STERILE (TOWEL DISPOSABLE) ×2 IMPLANT
TOWEL GREEN STERILE FF (TOWEL DISPOSABLE) ×2 IMPLANT
TRAY CATH LUMEN 1 20CM STRL (SET/KITS/TRAYS/PACK) IMPLANT
TRAY FOLEY SLVR 14FR TEMP STAT (SET/KITS/TRAYS/PACK) ×1 IMPLANT
TUBE CONNECTING 20X1/4 (TUBING) IMPLANT
WATER STERILE IRR 1000ML POUR (IV SOLUTION) ×4 IMPLANT
YANKAUER SUCT BULB TIP NO VENT (SUCTIONS) ×1 IMPLANT

## 2024-01-08 SURGICAL SUPPLY — 104 items
ADAPTER CARDIO PERF ANTE/RETRO (ADAPTER) ×1 IMPLANT
ARISS PERFSION SWITH IMPLANT
BAG DECANTER FOR FLEXI CONT (MISCELLANEOUS) ×1 IMPLANT
BENZOIN TINCTURE PRP APPL 2/3 (GAUZE/BANDAGES/DRESSINGS) IMPLANT
BLADE CLIPPER SURG (BLADE) ×1 IMPLANT
BLADE STERNUM SYSTEM 6 (BLADE) ×1 IMPLANT
BLADE SURG 11 STRL SS (BLADE) IMPLANT
BLADE SURG 15 STRL LF DISP TIS (BLADE) ×1 IMPLANT
CANISTER SUCTION 3000ML PPV (SUCTIONS) ×1 IMPLANT
CANNULA GUNDRY RCSP 15FR (MISCELLANEOUS) IMPLANT
CANNULA MC2 2 STG 36/46 CONN (CANNULA) IMPLANT
CANNULA PRFSN .5XCNCT 15X34-48 (MISCELLANEOUS) IMPLANT
CATH HEART VENT LEFT (CATHETERS) IMPLANT
CATH THORACIC 28FR (CATHETERS) IMPLANT
CATH THORACIC 36FR RT ANG (CATHETERS) IMPLANT
CAUTERY EYE LOW TEMP 1300F FIN (OPHTHALMIC RELATED) ×1 IMPLANT
CAUTERY HI TEMP FINE TIP 2 (MISCELLANEOUS) IMPLANT
CLIP TI MEDIUM 6 (CLIP) ×1 IMPLANT
CLIP TI WIDE RED SMALL 24 (CLIP) IMPLANT
CONN 3/8X3/8 GISH STERILE (MISCELLANEOUS) IMPLANT
CONN ST 1/4X3/8 BEN (MISCELLANEOUS) IMPLANT
CONN Y 3/8X3/8X3/8 BEN (MISCELLANEOUS) IMPLANT
CONTAINER PROTECT SURGISLUSH (MISCELLANEOUS) IMPLANT
COUNTER NDL 20CT MAGNET RED (NEEDLE) IMPLANT
DEVICE SUT CK QUICK LOAD MINI (Prosthesis & Implant Heart) IMPLANT
DRAIN CHANNEL 28F RND 3/8 FF (WOUND CARE) IMPLANT
DRAPE SRG 135X102X78XABS (DRAPES) ×1 IMPLANT
DRAPE WARM FLUID 44X44 (DRAPES) ×1 IMPLANT
DRSG COVADERM 4X14 (GAUZE/BANDAGES/DRESSINGS) ×1 IMPLANT
DRSG COVADERM 4X6 (GAUZE/BANDAGES/DRESSINGS) IMPLANT
ELECTRODE REM PT RTRN 9FT ADLT (ELECTROSURGICAL) ×2 IMPLANT
FELT TEFLON 1X6 (MISCELLANEOUS) IMPLANT
FELT TEFLON 6X6 (MISCELLANEOUS) IMPLANT
GAUZE 4X4 16PLY ~~LOC~~+RFID DBL (SPONGE) ×1 IMPLANT
GAUZE SPONGE 4X4 12PLY STRL (GAUZE/BANDAGES/DRESSINGS) ×2 IMPLANT
GLOVE BIOGEL PI IND STRL 8 (GLOVE) IMPLANT
GLOVE SS BIOGEL STRL SZ 7.5 (GLOVE) ×1 IMPLANT
GOWN STRL REUS W/ TWL LRG LVL3 (GOWN DISPOSABLE) ×4 IMPLANT
GRAFT CV 30X8WVN NDL (Graft) IMPLANT
GRAFT GELWEAVE VALSALVA 32 (Prosthesis & Implant Heart) IMPLANT
GRAFT GELWEAVE VALSALVA 32CM (Prosthesis & Implant Heart) ×1 IMPLANT
HEMOSTAT SURGICEL 2X14 (HEMOSTASIS) IMPLANT
INSERT FOGARTY SM (MISCELLANEOUS) ×1 IMPLANT
KIT BASIN OR (CUSTOM PROCEDURE TRAY) ×1 IMPLANT
KIT SUCTION CATH 14FR (SUCTIONS) IMPLANT
KIT SUT CK MINI COMBO 4X17 (Prosthesis & Implant Heart) IMPLANT
KIT TURNOVER KIT B (KITS) ×1 IMPLANT
LINE VENT (MISCELLANEOUS) IMPLANT
LOOP VASCLR EXTRA MAXI WHITE (MISCELLANEOUS) IMPLANT
LOOP VESSEL MINI RED (MISCELLANEOUS) IMPLANT
LOOPS VASCLR EXTRA MAXI WHITE (MISCELLANEOUS) ×1 IMPLANT
NDL AORTIC AIR ASPIRATING (NEEDLE) IMPLANT
NEEDLE AORTIC AIR ASPIRATING (NEEDLE) ×1 IMPLANT
NS IRRIG 1000ML POUR BTL (IV SOLUTION) ×4 IMPLANT
PACK E OPEN HEART (SUTURE) ×1 IMPLANT
PACK OPEN HEART (CUSTOM PROCEDURE TRAY) ×1 IMPLANT
PAD ARMBOARD POSITIONER FOAM (MISCELLANEOUS) ×2 IMPLANT
POSITIONER HEAD DONUT 9IN (MISCELLANEOUS) IMPLANT
RELOAD STAPLE 30 PURP MED/THCK (STAPLE) IMPLANT
RELOAD TRI 2.0 30 MED THCK SUL (STAPLE) ×1 IMPLANT
SEALANT PATCH FIBRIN 2X4IN (MISCELLANEOUS) IMPLANT
SEALANT SURG COSEAL 8ML (VASCULAR PRODUCTS) ×1 IMPLANT
SET MPS 3-ND DEL (MISCELLANEOUS) IMPLANT
SET VEIN GRAFT PERF (SET/KITS/TRAYS/PACK) IMPLANT
SPONGE T-LAP 18X18 ~~LOC~~+RFID (SPONGE) ×4 IMPLANT
SPONGE T-LAP 4X18 ~~LOC~~+RFID (SPONGE) ×1 IMPLANT
STAPLER ENDO GIA 12 SHRT THIN (STAPLE) IMPLANT
STAPLER ENDO GIA 12MM SHORT (STAPLE) ×1 IMPLANT
STAPLER SKIN PROX 35W (STAPLE) ×1 IMPLANT
STOPCOCK 4 WAY LG BORE MALE ST (IV SETS) IMPLANT
SUT ETHIBOND 4 0 RB 1 (SUTURE) IMPLANT
SUT MNCRL AB 3-0 PS2 18 (SUTURE) IMPLANT
SUT PROLENE 3 0 SH DA (SUTURE) ×1 IMPLANT
SUT PROLENE 4 0 SH DA (SUTURE) IMPLANT
SUT PROLENE 4-0 RB1 .5 CRCL 36 (SUTURE) IMPLANT
SUT PROLENE 5 0 C 1 36 (SUTURE) IMPLANT
SUT PROLENE 6 0 C 1 30 (SUTURE) IMPLANT
SUT PROLENE 6 0 CC (SUTURE) IMPLANT
SUT PROLENE 7 0 BV 1 (SUTURE) IMPLANT
SUT PROLENE 7 0 BV1 MDA (SUTURE) IMPLANT
SUT SILK 1 MH (SUTURE) IMPLANT
SUT SILK 2 0 SH CR/8 (SUTURE) IMPLANT
SUT STEEL 6MS V (SUTURE) IMPLANT
SUT STEEL STERNAL CCS#1 18IN (SUTURE) IMPLANT
SUT STEEL SZ 6 DBL 3X14 BALL (SUTURE) IMPLANT
SUT VIC AB 1 CT1 18XCR BRD 8 (SUTURE) IMPLANT
SUT VIC AB 1 CTX 27 (SUTURE) ×2 IMPLANT
SUT VIC AB 2-0 CT1 TAPERPNT 27 (SUTURE) IMPLANT
SUT VIC AB 2-0 CTX 36 (SUTURE) ×2 IMPLANT
SUT VIC AB 3-0 SH 27X BRD (SUTURE) IMPLANT
SUT VIC AB 3-0 X1 27 (SUTURE) ×2 IMPLANT
SUT VIC AB 4-0 PS2 18 (SUTURE) IMPLANT
SUTURE EB EXC GRN/WHT 2-0 D/A (SUTURE) IMPLANT
SWITCH PERFUSION CARDIOPLEGIA (ADAPTER) IMPLANT
SYSTEM SAHARA CHEST DRAIN ATS (WOUND CARE) ×1 IMPLANT
TAPE CLOTH SURG 4X10 WHT LF (GAUZE/BANDAGES/DRESSINGS) IMPLANT
TAPE PAPER 2X10 WHT MICROPORE (GAUZE/BANDAGES/DRESSINGS) IMPLANT
TOWEL GREEN STERILE (TOWEL DISPOSABLE) ×1 IMPLANT
TOWEL GREEN STERILE FF (TOWEL DISPOSABLE) ×1 IMPLANT
TRAY CATH LUMEN 1 20CM STRL (SET/KITS/TRAYS/PACK) IMPLANT
TRAY FOLEY SLVR 14FR TEMP STAT (SET/KITS/TRAYS/PACK) ×1 IMPLANT
TUBE CONNECTING 20X1/4 (TUBING) IMPLANT
WATER STERILE IRR 1000ML POUR (IV SOLUTION) ×2 IMPLANT
YANKAUER SUCT BULB TIP NO VENT (SUCTIONS) IMPLANT

## 2024-01-08 NOTE — Anesthesia Preprocedure Evaluation (Signed)
 Anesthesia Evaluation  Patient identified by MRN, date of birth, ID band Patient awake    Reviewed: Allergy & Precautions, H&P , NPO status , Patient's Chart, lab work & pertinent test results  Airway Mallampati: II   Neck ROM: full    Dental   Pulmonary Current Smoker   breath sounds clear to auscultation       Cardiovascular hypertension, + Peripheral Vascular Disease   Rhythm:regular Rate:Normal  CT: Ascending aortic aneurysm 6.7 cm with intramural hematoma  TTE (2015): EF 35-40%, mod LVH   Neuro/Psych    GI/Hepatic   Endo/Other    Renal/GU      Musculoskeletal   Abdominal   Peds  Hematology   Anesthesia Other Findings   Reproductive/Obstetrics                             Anesthesia Physical Anesthesia Plan  ASA: 3  Anesthesia Plan: General   Post-op Pain Management:    Induction: Intravenous  PONV Risk Score and Plan: 1 and Ondansetron , Dexamethasone, Midazolam  and Treatment may vary due to age or medical condition  Airway Management Planned: Oral ETT  Additional Equipment: Arterial line, CVP, PA Cath, TEE and Ultrasound Guidance Line Placement  Intra-op Plan: Utilization Of Total Body Hypothermia per surgeon request and Delibrate Circulatory arrest per surgeon request  Post-operative Plan: Post-operative intubation/ventilation  Informed Consent: I have reviewed the patients History and Physical, chart, labs and discussed the procedure including the risks, benefits and alternatives for the proposed anesthesia with the patient or authorized representative who has indicated his/her understanding and acceptance.     Dental advisory given  Plan Discussed with: CRNA, Anesthesiologist and Surgeon  Anesthesia Plan Comments:        Anesthesia Quick Evaluation

## 2024-01-08 NOTE — Transfer of Care (Signed)
 Immediate Anesthesia Transfer of Care Note  Patient: Jeremy [name]  Procedure(s) Performed: REPAIR, ANEURYSM, AORTA, THORACIC, ASCENDING USING GELWEAVE GRAFT ECHOCARDIOGRAM, TRANSESOPHAGEAL, INTRAOPERATIVE  Patient Location: PACU and ICU  Anesthesia Type:General  Level of Consciousness: Patient remains intubated per anesthesia plan  Airway & Oxygen Therapy: Patient remains intubated per anesthesia plan and Patient placed on Ventilator (see vital sign flow sheet for setting)  Post-op Assessment: Report given to RN and Dr. Kerrin at bedside, ordered albumin and blood products  Post vital signs: Reviewed and stable  Last Vitals:  Vitals Value Taken Time  BP 92/70 01/08/24 18:00  Temp    Pulse 89 01/08/24 18:06  Resp 23 01/08/24 18:06  SpO2 99 % 01/08/24 18:06  Vitals shown include unfiled device data.  Last Pain:  Vitals:   01/08/24 0636  TempSrc:   PainSc: 0-No pain      Patients Stated Pain Goal: 0 (01/08/24 0400)  Complications: No notable events documented.

## 2024-01-08 NOTE — Interval H&P Note (Signed)
 History and Physical Interval Note:  01/08/2024 7:08 AM  Jeremy [name]  has presented today for surgery, with the diagnosis of aortic dissection.  The various methods of treatment have been discussed with the patient and family. After consideration of risks, benefits and other options for treatment, the patient has consented to  Procedure(s) with comments: REPAIR, ANEURYSM, AORTA, THORACIC, ASCENDING (N/A) - REPAIR TYPE 1 INTRAMURAL HEMATOMA POSS AORTIC VALVE REPLACEMENT ECHOCARDIOGRAM, TRANSESOPHAGEAL, INTRAOPERATIVE (N/A) as a surgical intervention.  The patient's history has been reviewed, patient examined, no change in status, stable for surgery.  I have reviewed the patient's chart and labs.  Questions were answered to the patient's satisfaction.     [name]

## 2024-01-08 NOTE — Discharge Instructions (Signed)

## 2024-01-08 NOTE — Consult Note (Signed)
 NAME:  Jeremy [name], MRN:  [id number], DOB:  Feb 20, 1981, LOS: 1 ADMISSION DATE:  01/07/2024, CONSULTATION DATE:  01/08/24 REFERRING MD:  Kerrin, CHIEF COMPLAINT:  chest pain   History of Present Illness:  43 year old w/ hx of cocaine abuse, thoracic aneurysm presenting with chest pain found to have contained aortic root injury and went to OR this AM for root-sparing graft repair.  Only 20 mins pump time.  Found to have large aortic defect and repair complicated by profound coagulopathy requiring great deal of transfusions.  Arrives to unit intubated on multiple pressors, sedated/paralyzed.  Pertinent  Medical History  HTN NICM Aortic dilation  Significant Hospital Events: Including procedures, antibiotic start and stop dates in addition to other pertinent events   6/25 admit 6/26 aortic aneurysm resection and graft repair, root sparing  Interim History / Subjective:  Consult  Objective    Blood pressure (!) 115/45, pulse 66, temperature 98.4 F (36.9 C), temperature source Oral, resp. rate 15, height 6' 1 (1.854 m), weight 107.4 kg, SpO2 95%. PAP: (17-300)/(2-276) 28/16  Vent Mode: SIMV;PRVC;PSV FiO2 (%):  [50 %] 50 % Set Rate:  [16 bmp] 16 bmp Vt Set:  [640 mL] 640 mL PEEP:  [10 cmH20] 10 cmH20   Intake/Output Summary (Last 24 hours) at 01/08/2024 1836 Last data filed at 01/08/2024 1703 Gross per 24 hour  Intake 10031.9 ml  Output 2240 ml  Net 7791.9 ml   Filed Weights   01/07/24 1702 01/07/24 2100 01/08/24 0524  Weight: 117.9 kg 109.5 kg 107.4 kg    Examination: General: intubated/sedated HENT: pupils small/reactive Lungs: diminished, passive on vent Cardiovascular: ext warm under bair hugger Abdomen: soft, hypoactive BS Extremities: minimal edema Neuro: intubated/sedated/paralyzed Drains: bloody output Patient Lines/Drains/Airways Status     Active Line/Drains/Airways     Name Placement date Placement time Site Days   Arterial Line 01/08/24 Left  Radial 01/08/24  0700  Radial  less than 1   Arterial Line 01/08/24 Right Radial 01/08/24  0700  Radial  less than 1   Peripheral IV 01/07/24 20 G Left Antecubital 01/07/24  1349  Antecubital  1   Peripheral IV 01/07/24 20 G Anterior;Distal;Right;Upper Arm 01/07/24  1651  Arm  1   Peripheral IV 01/08/24 16 G Right Hand 01/08/24  0650  Hand  less than 1   Chest Tube Medial Mediastinal 36 Fr. 01/08/24  1520  Mediastinal  less than 1   Urethral Catheter K. Dyane RN Double-lumen;Latex;Temperature probe 16 Fr. 01/08/24  0802  Double-lumen;Latex;Temperature probe  less than 1   Y Chest Tube 1 and 2 1 Medial Mediastinal 28 Fr. 2 Medial Mediastinal 28 Fr. 01/08/24  1520  -- less than 1   Airway 8 mm 01/08/24  0759  -- less than 1   Pulmonary Artery Catheter 01/08/24 Right 01/08/24  0715  -- less than 1   Wound 01/08/24 1148 Surgical Closed Surgical Incision Chest Other (Comment) 01/08/24  1148  Chest  less than 1   Wound 01/08/24 1149 Surgical Closed Surgical Incision Chest Right 01/08/24  1149  Chest  less than 1             Resolved problem list   Assessment and Plan  Postop vent management Postop mixed hemorrhagic, vasoplegic shock Contained ruptured ascending aortic aneurysm- s/p root-sparing repair 01/08/24 complicated by coagulopathy Hx HTN Class 1 obesity Hx recent cocaine abuse  No rapid wean, vent bundle, increase mandatory ventilation Avoid acidemia, coagulopathy, hypocalcemia, hypothermia Balanced  transfusion strategy targeting blood loss and BP Insulin drip goal 100-180 Monitor drain output GDMT per primary Will follow while in ICU   Labs   CBC: Recent Labs  Lab 01/07/24 1348 01/07/24 2037 01/08/24 0503 01/08/24 0806 01/08/24 1159 01/08/24 1248 01/08/24 1403 01/08/24 1412 01/08/24 1546 01/08/24 1549 01/08/24 1641 01/08/24 1644 01/08/24 1754  WBC 8.5  --  9.5  --   --   --   --   --   --   --   --   --  17.0*  HGB 15.0   < > 15.0   < > 11.7*   < > 8.4*   < >  9.2* 9.5* 9.2* 9.2* 10.3*  HCT 42.9   < > 42.2   < > 33.3*   < > 24.1*   < > 27.0* 28.0* 27.0* 27.0* 29.7*  MCV 86.7  --  86.5  --   --   --   --   --   --   --   --   --  90.0  PLT 271  --  272  --  184  --  90*  --   --   --   --   --  66*  67*   < > = values in this interval not displayed.    Basic Metabolic Panel: Recent Labs  Lab 01/07/24 1348 01/07/24 2037 01/08/24 0503 01/08/24 0806 01/08/24 1347 01/08/24 1412 01/08/24 1454 01/08/24 1458 01/08/24 1546 01/08/24 1549 01/08/24 1641 01/08/24 1644  NA 141   < > 135   < > 135 136   < > 138 139 138 139 139  K 4.3   < > 3.5   < > 5.2* 6.0*   < > 4.8 4.8 4.9 4.4 4.5  CL 104  --  102   < > 101 102  --  102  --  101  --  102  CO2 25  --  23  --   --   --   --   --   --   --   --   --   GLUCOSE 93  --  97   < > 148* 138*  --  187*  --  206*  --  193*  BUN 15  --  9   < > 13 13  --  13  --  13  --  13  CREATININE 1.03  --  1.00   < > 1.20 1.30*  --  1.40*  --  1.40*  --  1.60*  CALCIUM 9.7  --  8.6*  --   --   --   --   --   --   --   --   --    < > = values in this interval not displayed.   GFR: Estimated Creatinine Clearance: 75.8 mL/min (A) (by C-G formula based on SCr of 1.6 mg/dL (H)). Recent Labs  Lab 01/07/24 1348 01/08/24 0503 01/08/24 1754  WBC 8.5 9.5 17.0*    Liver Function Tests: Recent Labs  Lab 01/08/24 0503  AST 17  ALT 12  ALKPHOS 45  BILITOT 1.6*  PROT 7.2  ALBUMIN 3.5   No results for input(s): LIPASE, AMYLASE in the last 168 hours. No results for input(s): AMMONIA in the last 168 hours.  ABG    Component Value Date/Time   PHART 7.286 (L) 01/08/2024 1641   PCO2ART 48.6 (H) 01/08/2024 1641   PO2ART 235 (H)  01/08/2024 1641   HCO3 23.2 01/08/2024 1641   TCO2 23 01/08/2024 1644   ACIDBASEDEF 3.0 (H) 01/08/2024 1641   O2SAT 100 01/08/2024 1641     Coagulation Profile: Recent Labs  Lab 01/07/24 2033 01/08/24 1754  INR 1.0 1.0  PENDING    Cardiac Enzymes: No results for  input(s): CKTOTAL, CKMB, CKMBINDEX, TROPONINI in the last 168 hours.  HbA1C: Hemoglobin A1C  Date/Time Value Ref Range Status  04/27/2015 12:37 PM 5.10  Final   Hgb A1c MFr Bld  Date/Time Value Ref Range Status  01/07/2024 08:33 PM 4.7 (L) 4.8 - 5.6 % Final    Comment:    (NOTE) Diagnosis of Diabetes The following HbA1c ranges recommended by the American Diabetes Association (ADA) may be used as an aid in the diagnosis of diabetes mellitus.  Hemoglobin             Suggested A1C NGSP%              Diagnosis  <5.7                   Non Diabetic  5.7-6.4                Pre-Diabetic  >6.4                   Diabetic  <7.0                   Glycemic control for                       adults with diabetes.      CBG: Recent Labs  Lab 01/08/24 1820  GLUCAP 194*    Review of Systems:   Intubated/sedated  Past Medical History:  He,  has a past medical history of Back pain, cardiac catheterization, Hypertension, Hypertensive heart disease without congestive heart failure, and NICM (nonischemic cardiomyopathy) (HCC).   Surgical History:  History reviewed. No pertinent surgical history.   Social History:   reports that he has been smoking. He uses smokeless tobacco. He reports current alcohol use. He reports current drug use. Drug: Marijuana.   Family History:  His family history includes Cancer in his maternal grandmother and mother; Heart attack in his maternal grandfather and mother; Heart failure in his maternal grandfather. There is no history of Stroke.   Allergies Allergies  Allergen Reactions   Penicillins Hives   Principen [Ampicillin] Hives     Home Medications  Prior to Admission medications   Medication Sig Start Date End Date Taking? Authorizing Provider  amLODipine  (NORVASC ) 10 MG tablet Take 10 mg by mouth daily.   Yes [provider]  indomethacin (INDOCIN) 25 MG capsule Take 25 mg by mouth 2 (two) times daily.   Yes [provider]  lisinopril -hydrochlorothiazide (ZESTORETIC) 20-12.5 MG tablet Take 1 tablet by mouth daily.   Yes [provider]  metoprolol  tartrate (LOPRESSOR ) 25 MG tablet Take 25 mg by mouth 2 (two) times daily.   Yes [provider]     Critical care time: 31 mins

## 2024-01-08 NOTE — Progress Notes (Signed)
 Patient transported to OR with no complications noted.

## 2024-01-08 NOTE — Progress Notes (Addendum)
 Pt arrived to unit @ 1745. All gtts titrated emergently per verbal order from Dr. Kerrin. All blood emergently given with Dr. Kerrin at bedside: x4 FFP, x4 PRBC, x1 Plt. x7 Albumin given, total of 6 amps of sodium bicarb given emergently as well as 1L LR bolus. CCM and HF to bedside intermittently. PEEP changed from 10>5 per Dr. Kerrin. Dr. Kerrin remained at bedside from arrival to anesthesia arrival. All gtts are as follows on handoff to CRNA: Epi @ 10 Levo @ 45 Neo @ 100 Vaso @ .04  Milrinone stopped Propofol @ 30 Dex @ .7 LR carrier @ 120 LR bolus @ 999/hr Albumin @ 999/hr Amio @ 60 Insulin running per Endotool Ca gluconate & Mag given per [facility]  Earnie Ali, RN arrived @ [phone]. Pt sahara changed at this time 2L total since arrival to unit> pt taken emergently to OR at 1945 w/ anesthesiologist and CRNA's. Handoff given at this time.   All critical values given face-to-face to Dr. Kerrin from [phone].  Kendell Franks, RN

## 2024-01-08 NOTE — TOC Initial Note (Signed)
 Transition of Care [facility]) - Initial/Assessment Note    Patient Details  Name: Jeremy [name] MRN: [id number] Date of Birth: May 31, 1981  Transition of Care [facility]) CM/SW Contact:    Justina Delcia Czar, RN Phone Number: [phone] 01/08/2024, 7:57 PM  Clinical Narrative:                 Admitted with aortic thrombus and s/p root-sparing repair 01/08/24.  Spoke to pt's mother. Pt was living in home with mother.  Will continue to follow for dc needs.   Expected Discharge Plan: Long Term Acute Care (LTAC) Barriers to Discharge: Continued Medical Work up   Patient Goals and CMS Choice            Expected Discharge Plan and Services                                              Prior Living Arrangements/Services                       Activities of Daily Living      Permission Sought/Granted                  Emotional Assessment   Attitude/Demeanor/Rapport: Intubated (Following Commands or Not Following Commands)          Admission diagnosis:  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] Polysubstance abuse (HCC) [F19.10] Aortic thrombus (HCC) [I74.10] Intramural hematoma of thoracic aorta (HCC) [I71.019] S/P ascending aortic replacement [Z95.828] Patient Active Problem List   Diagnosis Date Noted   S/P ascending aortic replacement 01/08/2024   Intramural hematoma of thoracic aorta (HCC) 01/07/2024   Tobacco abuse 07/02/2018   Hypertensive heart disease without heart failure 04/27/2015   Excess ear wax 04/27/2015   Hypertensive heart disease without congestive heart failure    NICM (nonischemic cardiomyopathy) (HCC)    PCP:  Delbert Clam, MD Pharmacy:   [facility] [address], [place] - [address] [place] [address] Phone: [phone] Fax: [phone]     Social Drivers of Health (SDOH) Social History: SDOH Screenings   Food Insecurity: No Food Insecurity (01/07/2024)  Housing:  Low Risk  (01/08/2024)  Transportation Needs: No Transportation Needs (01/08/2024)  Utilities: Not At Risk (01/08/2024)  Depression (PHQ2-9): Low Risk  (07/02/2018)  Tobacco Use: High Risk (01/08/2024)   SDOH Interventions: Food Insecurity Interventions: Intervention Not Indicated Housing Interventions: Intervention Not Indicated Transportation Interventions: Intervention Not Indicated Utilities Interventions: Intervention Not Indicated   Readmission Risk Interventions     No data to display

## 2024-01-08 NOTE — Anesthesia Procedure Notes (Signed)
 Arterial Line Insertion Start/End6/26/2025 7:00 AM Performed by: Claudene Florina Boga, CRNA, CRNA  Patient location: Pre-op. Preanesthetic checklist: patient identified, IV checked, site marked, risks and benefits discussed, surgical consent, monitors and equipment checked, pre-op evaluation, timeout performed and anesthesia consent Lidocaine  1% used for infiltration Left, radial was placed Catheter size: 20 G Hand hygiene performed  and maximum sterile barriers used   Attempts: 1 Procedure performed without using ultrasound guided technique. Following insertion, dressing applied and Biopatch. Post procedure assessment: normal and unchanged  Patient tolerated the procedure well with no immediate complications.

## 2024-01-08 NOTE — Brief Op Note (Addendum)
 01/07/2024 - 01/08/2024  2:42 PM  PATIENT:  Jeremy [name]  43 y.o. male  PRE-OPERATIVE DIAGNOSIS:  aortic root aneurysm, type I aortic intramural hematoma  POST-OPERATIVE DIAGNOSIS:  aortic root aneurysm, type I aortic intramural hematoma  PROCEDURE:  VALVE SPARING AORTIC ROOT ANEURYSM, REPAIR, USING GELWEAVE GRAFT - REPAIR TYPE 1 INTRAMURAL HEMATOMA UNDER MODERATE HYPOTHERMIC CIRCULATORY ARREST ECHOCARDIOGRAM, TRANSESOPHAGEAL, INTRAOPERATIVE   SURGEON:  Surgeons and Role:    * Kerrin Elspeth BROCKS, MD - Primary  PHYSICIAN ASSISTANT: Con Bend PA-C  ASSISTANTS: Jerel Fees RNFA   ANESTHESIA:   general  EBL:  massive   BLOOD ADMINISTERED: 6U FFP, 2U PLT, 2U Cryo, 6U PRBC  DRAINS: Mediastinal and pleural drains   LOCAL MEDICATIONS USED:  NONE  SPECIMEN:  Source of Specimen:  aortic aneurysm  DISPOSITION OF SPECIMEN:  PATHOLOGY  COUNTS:  YES  TOURNIQUET:  * No tourniquets in log *  DICTATION: .Dragon Dictation  PLAN OF CARE: Admit to inpatient   PATIENT DISPOSITION:  ICU - intubated and hemodynamically stable.   Delay start of Pharmacological VTE agent (>24hrs) due to surgical blood loss or risk of bleeding: yes

## 2024-01-08 NOTE — Progress Notes (Signed)
  Echocardiogram Echocardiogram Transesophageal has been performed.  Koleen KANDICE Popper, RDCS 01/08/2024, 8:44 AM

## 2024-01-08 NOTE — Progress Notes (Signed)
      [address]       [name] [address]             [phone]      Initially on return to ICU had high CT output  Continued resuscitation with blood products and appeared to be slowing down.  Unfortunately over past 1/2 hour CT output has increased significantly.    We need to return to OR for reexploration of mediastinum  I informed his mother by telephone as she has already gone home.  She is aware of the seriousness of the situation and gives consent  Elspeth BROCKS. Kerrin, MD [facility] [phone]

## 2024-01-08 NOTE — Anesthesia Procedure Notes (Signed)
 Central Venous Catheter Insertion Performed by: Maryclare Cornet, MD, anesthesiologist Start/End6/26/2025 7:10 AM, 01/08/2024 7:13 AM Patient location: Pre-op. Preanesthetic checklist: patient identified, IV checked, site marked, risks and benefits discussed, surgical consent, monitors and equipment checked, pre-op evaluation, timeout performed and anesthesia consent Hand hygiene performed  and maximum sterile barriers used  PA cath was placed.Swan type:thermodilution Procedure performed without using ultrasound guided technique. Attempts: 1 Patient tolerated the procedure well with no immediate complications.

## 2024-01-08 NOTE — Anesthesia Preprocedure Evaluation (Signed)
 Anesthesia Evaluation  Patient identified by MRN, date of birth, ID band Patient unresponsive    Reviewed: Allergy & Precautions, NPO status , Patient's Chart, lab work & pertinent test results, Unable to perform ROS - Chart review onlyPreop documentation limited or incomplete due to emergent nature of procedure.  Airway Mallampati: Intubated       Dental   Pulmonary Current Smoker      + intubated    Cardiovascular hypertension, +CHF   Rhythm:Regular Rate:Tachycardia  POD#0 S/p REPAIR, ANEURYSM, AORTA, THORACIC, ASCENDING USING GELWEAVE GRAFT - REPAIR TYPE 1   Neuro/Psych    GI/Hepatic   Endo/Other    Renal/GU Renal disease (AKI)     Musculoskeletal   Abdominal   Peds  Hematology  (+) Blood dyscrasia, anemia   Anesthesia Other Findings   Reproductive/Obstetrics                              Anesthesia Physical Anesthesia Plan  ASA: 5 and emergent  Anesthesia Plan: General   Post-op Pain Management:    Induction: Intravenous  PONV Risk Score and Plan: 1  Airway Management Planned: Oral ETT  Additional Equipment: Arterial line, CVP and PA Cath  Intra-op Plan:   Post-operative Plan: Post-operative intubation/ventilation  Informed Consent: I have reviewed the patients History and Physical, chart, labs and discussed the procedure including the risks, benefits and alternatives for the proposed anesthesia with the patient or authorized representative who has indicated his/her understanding and acceptance.     Only emergency history available  Plan Discussed with: CRNA  Anesthesia Plan Comments: (Bring back for post-op bleeding.)         Anesthesia Quick Evaluation

## 2024-01-08 NOTE — Anesthesia Procedure Notes (Signed)
 Arterial Line Insertion Start/End6/26/2025 7:00 AM Performed by: Emmitt Millman, CRNA, CRNA  Patient location: Pre-op. Preanesthetic checklist: patient identified, IV checked, site marked, risks and benefits discussed, surgical consent, monitors and equipment checked, pre-op evaluation, timeout performed and anesthesia consent Lidocaine  1% used for infiltration Right, radial was placed Catheter size: 20 G Hand hygiene performed  and maximum sterile barriers used   Attempts: 1 Procedure performed without using ultrasound guided technique. Ultrasound Notes:image(s) printed for medical record Following insertion, dressing applied. Post procedure assessment: normal and unchanged  Patient tolerated the procedure well with no immediate complications.

## 2024-01-08 NOTE — Anesthesia Procedure Notes (Signed)
 Procedure Name: Intubation Date/Time: 01/08/2024 7:59 AM  Performed by: Claudene Florina Boga, CRNAPre-anesthesia Checklist: Patient identified, Emergency Drugs available, Suction available and Patient being monitored Patient Re-evaluated:Patient Re-evaluated prior to induction Oxygen Delivery Method: Circle System Utilized Preoxygenation: Pre-oxygenation with 100% oxygen Induction Type: IV induction Ventilation: Mask ventilation without difficulty and Oral airway inserted - appropriate to patient size Laryngoscope Size: Mac and 4 Grade View: Grade I Tube type: Oral Tube size: 8.0 mm Number of attempts: 1 Airway Equipment and Method: Stylet and Oral airway Placement Confirmation: ETT inserted through vocal cords under direct vision, positive ETCO2 and breath sounds checked- equal and bilateral Secured at: 23 cm Tube secured with: Tape Dental Injury: Teeth and Oropharynx as per pre-operative assessment

## 2024-01-08 NOTE — Brief Op Note (Signed)
 01/07/2024 - 01/08/2024  10:48 PM  PATIENT:  Jeremy [name]  43 y.o. male  PRE-OPERATIVE DIAGNOSIS:  POST OPERATIVE BLEEDING  POST-OPERATIVE DIAGNOSIS:  POST OPERATIVE BLEEDING  PROCEDURE:  MEDIASTINAL EXPLORATION  FOR POST OPERATIVE BLEEDING  SURGEON:   Kerrin Elspeth BROCKS, MD - Primary  PHYSICIAN ASSISTANT: Taron Mondor  ASSISTANTS: Pesare, Piyanuch, RN, Scrub Person         Lawernce Suzen LABOR, RN, RN First Assistant   ANESTHESIA:   general  EBL:  (processed through Cell[employer]aver and returned to patient)   BLOOD ADMINISTERED:  Cryoprecipitate 97ml     FFP       PRBC's      Platelet Pheresis   DRAINS: Mediastinal drains x 2, right pleural drain   LOCAL MEDICATIONS USED:  NONE  COUNTS:  Emergent case, counts not done prior to surgery. CXR in OR showed no unexpected FB's  in the surgical field.   DICTATION: Scientist, research (medical) Dictation  PLAN OF CARE: Admit to inpatient   PATIENT DISPOSITION:  ICU - intubated and hemodynamically stable.   Delay start of Pharmacological VTE agent (>24hrs) due to surgical blood loss or risk of bleeding: yes

## 2024-01-08 NOTE — Anesthesia Procedure Notes (Signed)
 Central Venous Catheter Insertion Performed by: Maryclare Cornet, MD, anesthesiologist Start/End6/26/2025 7:00 AM, 01/08/2024 7:10 AM Patient location: Pre-op. Preanesthetic checklist: patient identified, IV checked, site marked, risks and benefits discussed, surgical consent, monitors and equipment checked, pre-op evaluation, timeout performed and anesthesia consent Lidocaine  1% used for infiltration and patient sedated Hand hygiene performed  and maximum sterile barriers used  Catheter size: 8.5 Fr Sheath introducer Procedure performed using ultrasound guided technique. Ultrasound Notes:anatomy identified, needle tip was noted to be adjacent to the nerve/plexus identified, no ultrasound evidence of intravascular and/or intraneural injection and image(s) printed for medical record Attempts: 1 Following insertion, line sutured and dressing applied. Post procedure assessment: blood return through all ports, free fluid flow and no air  Patient tolerated the procedure well with no immediate complications.

## 2024-01-09 ENCOUNTER — Encounter (HOSPITAL_COMMUNITY): Payer: Self-pay | Admitting: Thoracic Surgery (Cardiothoracic Vascular Surgery)

## 2024-01-09 ENCOUNTER — Inpatient Hospital Stay (HOSPITAL_COMMUNITY): Admitting: Anesthesiology

## 2024-01-09 ENCOUNTER — Encounter (HOSPITAL_COMMUNITY)
Admission: EM | Discharge status: Against Medical Advice | Payer: Self-pay | Source: Home / Self Care | Attending: Thoracic Surgery (Cardiothoracic Vascular Surgery)

## 2024-01-09 ENCOUNTER — Inpatient Hospital Stay (HOSPITAL_COMMUNITY)

## 2024-01-09 DIAGNOSIS — I110 Hypertensive heart disease with heart failure: Secondary | ICD-10-CM | POA: Diagnosis not present

## 2024-01-09 DIAGNOSIS — I9762 Postprocedural hemorrhage of a circulatory system organ or structure following other procedure: Secondary | ICD-10-CM | POA: Diagnosis not present

## 2024-01-09 DIAGNOSIS — I509 Heart failure, unspecified: Secondary | ICD-10-CM

## 2024-01-09 DIAGNOSIS — F17210 Nicotine dependence, cigarettes, uncomplicated: Secondary | ICD-10-CM | POA: Diagnosis not present

## 2024-01-09 DIAGNOSIS — I71019 Dissection of thoracic aorta, unspecified: Secondary | ICD-10-CM | POA: Diagnosis not present

## 2024-01-09 DIAGNOSIS — I97618 Postprocedural hemorrhage and hematoma of a circulatory system organ or structure following other circulatory system procedure: Secondary | ICD-10-CM

## 2024-01-09 DIAGNOSIS — R579 Shock, unspecified: Secondary | ICD-10-CM | POA: Diagnosis not present

## 2024-01-09 HISTORY — PX: EXPLORATION POST OPERATIVE OPEN HEART: SHX5061

## 2024-01-09 LAB — PREPARE FRESH FROZEN PLASMA
Unit division: 0
Unit division: 0

## 2024-01-09 LAB — POCT I-STAT, CHEM 8
BUN: 15 mg/dL (ref 6–20)
BUN: 13 mg/dL (ref 6–20)
BUN: 15 mg/dL (ref 6–20)
BUN: 16 mg/dL (ref 6–20)
BUN: 16 mg/dL (ref 6–20)
BUN: 15 mg/dL (ref 6–20)
Calcium, Ion: 1.06 mmol/L — ABNORMAL LOW (ref 1.15–1.40)
Calcium, Ion: 1.12 mmol/L — ABNORMAL LOW (ref 1.15–1.40)
Calcium, Ion: 1.08 mmol/L — ABNORMAL LOW (ref 1.15–1.40)
Calcium, Ion: 1.13 mmol/L — ABNORMAL LOW (ref 1.15–1.40)
Calcium, Ion: 1.12 mmol/L — ABNORMAL LOW (ref 1.15–1.40)
Calcium, Ion: 1.1 mmol/L — ABNORMAL LOW (ref 1.15–1.40)
Chloride: 104 mmol/L (ref 98–111)
Chloride: 102 mmol/L (ref 98–111)
Chloride: 103 mmol/L (ref 98–111)
Chloride: 104 mmol/L (ref 98–111)
Chloride: 104 mmol/L (ref 98–111)
Chloride: 102 mmol/L (ref 98–111)
Creatinine, Ser: 2.1 mg/dL — ABNORMAL HIGH (ref 0.61–1.24)
Creatinine, Ser: 2 mg/dL — ABNORMAL HIGH (ref 0.61–1.24)
Creatinine, Ser: 1.8 mg/dL — ABNORMAL HIGH (ref 0.61–1.24)
Creatinine, Ser: 1.8 mg/dL — ABNORMAL HIGH (ref 0.61–1.24)
Creatinine, Ser: 1.8 mg/dL — ABNORMAL HIGH (ref 0.61–1.24)
Creatinine, Ser: 1.8 mg/dL — ABNORMAL HIGH (ref 0.61–1.24)
Glucose, Bld: 180 mg/dL — ABNORMAL HIGH (ref 70–99)
Glucose, Bld: 201 mg/dL — ABNORMAL HIGH (ref 70–99)
Glucose, Bld: 177 mg/dL — ABNORMAL HIGH (ref 70–99)
Glucose, Bld: 215 mg/dL — ABNORMAL HIGH (ref 70–99)
Glucose, Bld: 187 mg/dL — ABNORMAL HIGH (ref 70–99)
Glucose, Bld: 182 mg/dL — ABNORMAL HIGH (ref 70–99)
HCT: 21 % — ABNORMAL LOW (ref 39.0–52.0)
HCT: 26 % — ABNORMAL LOW (ref 39.0–52.0)
HCT: 22 % — ABNORMAL LOW (ref 39.0–52.0)
HCT: 26 % — ABNORMAL LOW (ref 39.0–52.0)
HCT: 31 % — ABNORMAL LOW (ref 39.0–52.0)
HCT: 30 % — ABNORMAL LOW (ref 39.0–52.0)
Hemoglobin: 7.1 g/dL — ABNORMAL LOW (ref 13.0–17.0)
Hemoglobin: 8.8 g/dL — ABNORMAL LOW (ref 13.0–17.0)
Hemoglobin: 7.5 g/dL — ABNORMAL LOW (ref 13.0–17.0)
Hemoglobin: 8.8 g/dL — ABNORMAL LOW (ref 13.0–17.0)
Hemoglobin: 10.5 g/dL — ABNORMAL LOW (ref 13.0–17.0)
Hemoglobin: 10.2 g/dL — ABNORMAL LOW (ref 13.0–17.0)
Potassium: 3.8 mmol/L (ref 3.5–5.1)
Potassium: 3.8 mmol/L (ref 3.5–5.1)
Potassium: 3.9 mmol/L (ref 3.5–5.1)
Potassium: 4.8 mmol/L (ref 3.5–5.1)
Potassium: 4.5 mmol/L (ref 3.5–5.1)
Potassium: 4.2 mmol/L (ref 3.5–5.1)
Sodium: 145 mmol/L (ref 135–145)
Sodium: 146 mmol/L — ABNORMAL HIGH (ref 135–145)
Sodium: 142 mmol/L (ref 135–145)
Sodium: 144 mmol/L (ref 135–145)
Sodium: 144 mmol/L (ref 135–145)
Sodium: 144 mmol/L (ref 135–145)
TCO2: 20 mmol/L — ABNORMAL LOW (ref 22–32)
TCO2: 21 mmol/L — ABNORMAL LOW (ref 22–32)
TCO2: 24 mmol/L (ref 22–32)
TCO2: 26 mmol/L (ref 22–32)
TCO2: 23 mmol/L (ref 22–32)
TCO2: 23 mmol/L (ref 22–32)

## 2024-01-09 LAB — PROTIME-INR
INR: 1.1 (ref 0.8–1.2)
Prothrombin Time: 14.9 s (ref 11.4–15.2)

## 2024-01-09 LAB — COMPREHENSIVE METABOLIC PANEL WITH GFR
ALT: 28 U/L (ref 0–44)
AST: 165 U/L — ABNORMAL HIGH (ref 15–41)
Albumin: 2.4 g/dL — ABNORMAL LOW (ref 3.5–5.0)
Alkaline Phosphatase: 21 U/L — ABNORMAL LOW (ref 38–126)
Anion gap: 9 (ref 5–15)
BUN: 13 mg/dL (ref 6–20)
CO2: 25 mmol/L (ref 22–32)
Calcium: 8.1 mg/dL — ABNORMAL LOW (ref 8.9–10.3)
Chloride: 110 mmol/L (ref 98–111)
Creatinine, Ser: 1.72 mg/dL — ABNORMAL HIGH (ref 0.61–1.24)
GFR, Estimated: 50 mL/min — ABNORMAL LOW (ref >60)
Glucose, Bld: 128 mg/dL — ABNORMAL HIGH (ref 70–99)
Potassium: 4.1 mmol/L (ref 3.5–5.1)
Sodium: 144 mmol/L (ref 135–145)
Total Bilirubin: 0.8 mg/dL (ref 0.0–1.2)
Total Protein: 3.8 g/dL — ABNORMAL LOW (ref 6.5–8.1)

## 2024-01-09 LAB — PREPARE PLATELET PHERESIS
Unit division: 0
Unit division: 0
Unit division: 0
Unit division: 0

## 2024-01-09 LAB — POCT I-STAT 7, (LYTES, BLD GAS, ICA,H+H)
Acid-Base Excess: 0 mmol/L (ref 0.0–2.0)
Acid-Base Excess: 1 mmol/L (ref 0.0–2.0)
Acid-Base Excess: 3 mmol/L — ABNORMAL HIGH (ref 0.0–2.0)
Acid-base deficit: 11 mmol/L — ABNORMAL HIGH (ref 0.0–2.0)
Acid-base deficit: 6 mmol/L — ABNORMAL HIGH (ref 0.0–2.0)
Acid-base deficit: 5 mmol/L — ABNORMAL HIGH (ref 0.0–2.0)
Acid-base deficit: 2 mmol/L (ref 0.0–2.0)
Acid-base deficit: 1 mmol/L (ref 0.0–2.0)
Bicarbonate: 15.9 mmol/L — ABNORMAL LOW (ref 20.0–28.0)
Bicarbonate: 20.4 mmol/L (ref 20.0–28.0)
Bicarbonate: 22.4 mmol/L (ref 20.0–28.0)
Bicarbonate: 23.6 mmol/L (ref 20.0–28.0)
Bicarbonate: 24.3 mmol/L (ref 20.0–28.0)
Bicarbonate: 24.5 mmol/L (ref 20.0–28.0)
Bicarbonate: 26.3 mmol/L (ref 20.0–28.0)
Bicarbonate: 28 mmol/L (ref 20.0–28.0)
Calcium, Ion: 1.23 mmol/L (ref 1.15–1.40)
Calcium, Ion: 1.08 mmol/L — ABNORMAL LOW (ref 1.15–1.40)
Calcium, Ion: 1.09 mmol/L — ABNORMAL LOW (ref 1.15–1.40)
Calcium, Ion: 1.09 mmol/L — ABNORMAL LOW (ref 1.15–1.40)
Calcium, Ion: 1.18 mmol/L (ref 1.15–1.40)
Calcium, Ion: 1.11 mmol/L — ABNORMAL LOW (ref 1.15–1.40)
Calcium, Ion: 1.11 mmol/L — ABNORMAL LOW (ref 1.15–1.40)
Calcium, Ion: 1.17 mmol/L (ref 1.15–1.40)
HCT: 16 % — ABNORMAL LOW (ref 39.0–52.0)
HCT: 20 % — ABNORMAL LOW (ref 39.0–52.0)
HCT: 22 % — ABNORMAL LOW (ref 39.0–52.0)
HCT: 22 % — ABNORMAL LOW (ref 39.0–52.0)
HCT: 30 % — ABNORMAL LOW (ref 39.0–52.0)
HCT: 28 % — ABNORMAL LOW (ref 39.0–52.0)
HCT: 32 % — ABNORMAL LOW (ref 39.0–52.0)
HCT: 29 % — ABNORMAL LOW (ref 39.0–52.0)
Hemoglobin: 5.4 g/dL — CL (ref 13.0–17.0)
Hemoglobin: 6.8 g/dL — CL (ref 13.0–17.0)
Hemoglobin: 7.5 g/dL — ABNORMAL LOW (ref 13.0–17.0)
Hemoglobin: 10.2 g/dL — ABNORMAL LOW (ref 13.0–17.0)
Hemoglobin: 7.5 g/dL — ABNORMAL LOW (ref 13.0–17.0)
Hemoglobin: 9.5 g/dL — ABNORMAL LOW (ref 13.0–17.0)
Hemoglobin: 10.9 g/dL — ABNORMAL LOW (ref 13.0–17.0)
Hemoglobin: 9.9 g/dL — ABNORMAL LOW (ref 13.0–17.0)
O2 Saturation: 94 %
O2 Saturation: 98 %
O2 Saturation: 100 %
O2 Saturation: 100 %
O2 Saturation: 96 %
O2 Saturation: 95 %
O2 Saturation: 96 %
O2 Saturation: 98 %
Patient temperature: 34.9
Patient temperature: 36.1
Patient temperature: 35.9
Potassium: 4.7 mmol/L (ref 3.5–5.1)
Potassium: 3.8 mmol/L (ref 3.5–5.1)
Potassium: 4.1 mmol/L (ref 3.5–5.1)
Potassium: 4.5 mmol/L (ref 3.5–5.1)
Potassium: 5.2 mmol/L — ABNORMAL HIGH (ref 3.5–5.1)
Potassium: 4.2 mmol/L (ref 3.5–5.1)
Potassium: 4.3 mmol/L (ref 3.5–5.1)
Potassium: 4.2 mmol/L (ref 3.5–5.1)
Sodium: 143 mmol/L (ref 135–145)
Sodium: 146 mmol/L — ABNORMAL HIGH (ref 135–145)
Sodium: 144 mmol/L (ref 135–145)
Sodium: 143 mmol/L (ref 135–145)
Sodium: 146 mmol/L — ABNORMAL HIGH (ref 135–145)
Sodium: 145 mmol/L (ref 135–145)
Sodium: 144 mmol/L (ref 135–145)
Sodium: 146 mmol/L — ABNORMAL HIGH (ref 135–145)
TCO2: 17 mmol/L — ABNORMAL LOW (ref 22–32)
TCO2: 22 mmol/L (ref 22–32)
TCO2: 24 mmol/L (ref 22–32)
TCO2: 25 mmol/L (ref 22–32)
TCO2: 26 mmol/L (ref 22–32)
TCO2: 26 mmol/L (ref 22–32)
TCO2: 28 mmol/L (ref 22–32)
TCO2: 29 mmol/L (ref 22–32)
pCO2 arterial: 36.7 mmHg (ref 32–48)
pCO2 arterial: 42.6 mmHg (ref 32–48)
pCO2 arterial: 55.6 mmHg — ABNORMAL HIGH (ref 32–48)
pCO2 arterial: 33.3 mmHg (ref 32–48)
pCO2 arterial: 49.7 mmHg — ABNORMAL HIGH (ref 32–48)
pCO2 arterial: 44 mmHg (ref 32–48)
pCO2 arterial: 42.5 mmHg (ref 32–48)
pCO2 arterial: 41 mmHg (ref 32–48)
pH, Arterial: 7.232 — ABNORMAL LOW (ref 7.35–7.45)
pH, Arterial: 7.288 — ABNORMAL LOW (ref 7.35–7.45)
pH, Arterial: 7.214 — ABNORMAL LOW (ref 7.35–7.45)
pH, Arterial: 7.298 — ABNORMAL LOW (ref 7.35–7.45)
pH, Arterial: 7.458 — ABNORMAL HIGH (ref 7.35–7.45)
pH, Arterial: 7.354 (ref 7.35–7.45)
pH, Arterial: 7.395 (ref 7.35–7.45)
pH, Arterial: 7.438 (ref 7.35–7.45)
pO2, Arterial: 77 mmHg — ABNORMAL LOW (ref 83–108)
pO2, Arterial: 124 mmHg — ABNORMAL HIGH (ref 83–108)
pO2, Arterial: 340 mmHg — ABNORMAL HIGH (ref 83–108)
pO2, Arterial: 266 mmHg — ABNORMAL HIGH (ref 83–108)
pO2, Arterial: 95 mmHg (ref 83–108)
pO2, Arterial: 82 mmHg — ABNORMAL LOW (ref 83–108)
pO2, Arterial: 82 mmHg — ABNORMAL LOW (ref 83–108)
pO2, Arterial: 103 mmHg (ref 83–108)

## 2024-01-09 LAB — BPAM FFP
Blood Product Expiration Date: 202506282359
Blood Product Expiration Date: 202506292359
Blood Product Expiration Date: 202506292359
Blood Product Expiration Date: 202506292359
Blood Product Expiration Date: 202506292359
Blood Product Expiration Date: 202506292359
Blood Product Expiration Date: 202506292359
Blood Product Expiration Date: 202506292359
Blood Product Expiration Date: 202506292359
Blood Product Expiration Date: 202506292359
Blood Product Expiration Date: 202506292359
Blood Product Expiration Date: 202506292359
Blood Product Expiration Date: 202506292359
Blood Product Expiration Date: 202506292359
ISSUE DATE / TIME: 202506261430
ISSUE DATE / TIME: 202506261430
ISSUE DATE / TIME: 202506261536
ISSUE DATE / TIME: 202506261536
ISSUE DATE / TIME: 202506261558
ISSUE DATE / TIME: 202506261558
ISSUE DATE / TIME: 202506261759
ISSUE DATE / TIME: 202506261759
ISSUE DATE / TIME: 202506261901
ISSUE DATE / TIME: 202506261901
ISSUE DATE / TIME: 202506262033
ISSUE DATE / TIME: 202506262033
ISSUE DATE / TIME: 202506262033
ISSUE DATE / TIME: 202506262033
Unit Type and Rh: 6200
Unit Type and Rh: 6200
Unit Type and Rh: 600
Unit Type and Rh: 6200
Unit Type and Rh: 6200
Unit Type and Rh: 6200
Unit Type and Rh: 6200
Unit Type and Rh: 6200
Unit Type and Rh: 6200
Unit Type and Rh: 6200
Unit Type and Rh: 6200
Unit Type and Rh: 6200
Unit Type and Rh: 6200
Unit Type and Rh: 6200

## 2024-01-09 LAB — BASIC METABOLIC PANEL WITH GFR
Anion gap: 10 (ref 5–15)
Anion gap: 13 (ref 5–15)
BUN: 14 mg/dL (ref 6–20)
BUN: 14 mg/dL (ref 6–20)
CO2: 24 mmol/L (ref 22–32)
CO2: 29 mmol/L (ref 22–32)
Calcium: 8.5 mg/dL — ABNORMAL LOW (ref 8.9–10.3)
Calcium: 8.5 mg/dL — ABNORMAL LOW (ref 8.9–10.3)
Chloride: 106 mmol/L (ref 98–111)
Chloride: 107 mmol/L (ref 98–111)
Creatinine, Ser: 1.6 mg/dL — ABNORMAL HIGH (ref 0.61–1.24)
Creatinine, Ser: 1.95 mg/dL — ABNORMAL HIGH (ref 0.61–1.24)
GFR, Estimated: 43 mL/min — ABNORMAL LOW (ref >60)
GFR, Estimated: 54 mL/min — ABNORMAL LOW (ref >60)
Glucose, Bld: 121 mg/dL — ABNORMAL HIGH (ref 70–99)
Glucose, Bld: 181 mg/dL — ABNORMAL HIGH (ref 70–99)
Potassium: 3.6 mmol/L (ref 3.5–5.1)
Potassium: 4.4 mmol/L (ref 3.5–5.1)
Sodium: 143 mmol/L (ref 135–145)
Sodium: 146 mmol/L — ABNORMAL HIGH (ref 135–145)

## 2024-01-09 LAB — CBC
HCT: 27.3 % — ABNORMAL LOW (ref 39.0–52.0)
HCT: 33.8 % — ABNORMAL LOW (ref 39.0–52.0)
HCT: 27.4 % — ABNORMAL LOW (ref 39.0–52.0)
HCT: 29.5 % — ABNORMAL LOW (ref 39.0–52.0)
Hemoglobin: 9.7 g/dL — ABNORMAL LOW (ref 13.0–17.0)
Hemoglobin: 12 g/dL — ABNORMAL LOW (ref 13.0–17.0)
Hemoglobin: 9.5 g/dL — ABNORMAL LOW (ref 13.0–17.0)
Hemoglobin: 10.6 g/dL — ABNORMAL LOW (ref 13.0–17.0)
MCH: 30.6 pg (ref 26.0–34.0)
MCH: 30 pg (ref 26.0–34.0)
MCH: 29.5 pg (ref 26.0–34.0)
MCH: 29.6 pg (ref 26.0–34.0)
MCHC: 35.5 g/dL (ref 30.0–36.0)
MCHC: 35.5 g/dL (ref 30.0–36.0)
MCHC: 34.7 g/dL (ref 30.0–36.0)
MCHC: 35.9 g/dL (ref 30.0–36.0)
MCV: 86.1 fL (ref 80.0–100.0)
MCV: 84.5 fL (ref 80.0–100.0)
MCV: 85.1 fL (ref 80.0–100.0)
MCV: 82.4 fL (ref 80.0–100.0)
Platelets: 45 10*3/uL — ABNORMAL LOW (ref 150–400)
Platelets: 83 10*3/uL — ABNORMAL LOW (ref 150–400)
Platelets: 108 10*3/uL — ABNORMAL LOW (ref 150–400)
Platelets: 124 10*3/uL — ABNORMAL LOW (ref 150–400)
RBC: 3.17 MIL/uL — ABNORMAL LOW (ref 4.22–5.81)
RBC: 4 MIL/uL — ABNORMAL LOW (ref 4.22–5.81)
RBC: 3.22 MIL/uL — ABNORMAL LOW (ref 4.22–5.81)
RBC: 3.58 MIL/uL — ABNORMAL LOW (ref 4.22–5.81)
RDW: 14.1 % (ref 11.5–15.5)
RDW: 14.1 % (ref 11.5–15.5)
RDW: 14 % (ref 11.5–15.5)
RDW: 14.1 % (ref 11.5–15.5)
WBC: 9.3 10*3/uL (ref 4.0–10.5)
WBC: 7.4 10*3/uL (ref 4.0–10.5)
WBC: 6.1 10*3/uL (ref 4.0–10.5)
WBC: 9.6 10*3/uL (ref 4.0–10.5)
nRBC: 0 % (ref 0.0–0.2)
nRBC: 0 % (ref 0.0–0.2)
nRBC: 0 % (ref 0.0–0.2)
nRBC: 0 % (ref 0.0–0.2)

## 2024-01-09 LAB — POCT I-STAT EG7
Acid-Base Excess: 0 mmol/L (ref 0.0–2.0)
Acid-base deficit: 2 mmol/L (ref 0.0–2.0)
Bicarbonate: 26.9 mmol/L (ref 20.0–28.0)
Bicarbonate: 24.5 mmol/L (ref 20.0–28.0)
Calcium, Ion: 1.13 mmol/L — ABNORMAL LOW (ref 1.15–1.40)
Calcium, Ion: 1.15 mmol/L (ref 1.15–1.40)
HCT: 22 % — ABNORMAL LOW (ref 39.0–52.0)
HCT: 26 % — ABNORMAL LOW (ref 39.0–52.0)
Hemoglobin: 7.5 g/dL — ABNORMAL LOW (ref 13.0–17.0)
Hemoglobin: 8.8 g/dL — ABNORMAL LOW (ref 13.0–17.0)
O2 Saturation: 52 %
O2 Saturation: 75 %
Potassium: 3.9 mmol/L (ref 3.5–5.1)
Potassium: 3.8 mmol/L (ref 3.5–5.1)
Sodium: 146 mmol/L — ABNORMAL HIGH (ref 135–145)
Sodium: 145 mmol/L (ref 135–145)
TCO2: 29 mmol/L (ref 22–32)
TCO2: 26 mmol/L (ref 22–32)
pCO2, Ven: 58.5 mmHg (ref 44–60)
pCO2, Ven: 48 mmHg (ref 44–60)
pH, Ven: 7.27 (ref 7.25–7.43)
pH, Ven: 7.316 (ref 7.25–7.43)
pO2, Ven: 32 mmHg (ref 32–45)
pO2, Ven: 44 mmHg (ref 32–45)

## 2024-01-09 LAB — HEMOGLOBIN AND HEMATOCRIT, BLOOD
HCT: 30.6 % — ABNORMAL LOW (ref 39.0–52.0)
Hemoglobin: 10.4 g/dL — ABNORMAL LOW (ref 13.0–17.0)

## 2024-01-09 LAB — PREPARE CRYOPRECIPITATE
Unit division: 0
Unit division: 0
Unit division: 0

## 2024-01-09 LAB — DIC (DISSEMINATED INTRAVASCULAR COAGULATION)PANEL
D-Dimer, Quant: 0.52 ug{FEU}/mL — ABNORMAL HIGH (ref 0.00–0.50)
Fibrinogen: 185 mg/dL — ABNORMAL LOW (ref 210–475)
INR: 1.2 (ref 0.8–1.2)
Platelets: 49 K/uL — ABNORMAL LOW (ref 150–400)
Prothrombin Time: 15.7 s — ABNORMAL HIGH (ref 11.4–15.2)
Smear Review: NONE SEEN
aPTT: 45 s — ABNORMAL HIGH (ref 24–36)

## 2024-01-09 LAB — BPAM CRYOPRECIPITATE
Blood Product Expiration Date: 202507012359
Blood Product Expiration Date: 202507012359
Blood Product Expiration Date: 202507012359
ISSUE DATE / TIME: 202506261427
ISSUE DATE / TIME: 202506261947
ISSUE DATE / TIME: 202506262257
Unit Type and Rh: 6200
Unit Type and Rh: 6200
Unit Type and Rh: 5100

## 2024-01-09 LAB — BPAM PLATELET PHERESIS
Blood Product Expiration Date: 202506272359
Blood Product Expiration Date: 202506272359
Blood Product Expiration Date: 202506292359
Blood Product Expiration Date: 202506292359
ISSUE DATE / TIME: 202506261433
ISSUE DATE / TIME: 202506261433
ISSUE DATE / TIME: 202506261756
ISSUE DATE / TIME: 202506262152
Unit Type and Rh: 6200
Unit Type and Rh: 7300
Unit Type and Rh: 5100
Unit Type and Rh: 5100

## 2024-01-09 LAB — GLUCOSE, CAPILLARY
Glucose-Capillary: 113 mg/dL — ABNORMAL HIGH (ref 70–99)
Glucose-Capillary: 135 mg/dL — ABNORMAL HIGH (ref 70–99)
Glucose-Capillary: 169 mg/dL — ABNORMAL HIGH (ref 70–99)
Glucose-Capillary: 185 mg/dL — ABNORMAL HIGH (ref 70–99)
Glucose-Capillary: 187 mg/dL — ABNORMAL HIGH (ref 70–99)
Glucose-Capillary: 202 mg/dL — ABNORMAL HIGH (ref 70–99)

## 2024-01-09 LAB — PREPARE RBC (CROSSMATCH)

## 2024-01-09 LAB — GLOBAL TEG PANEL
CFF Max Amplitude: 11.8 mm — ABNORMAL LOW (ref 15–32)
CK with Heparinase (R): 7.8 min (ref 4.3–8.3)
Citrated Functional Fibrinogen: 215.3 mg/dL — ABNORMAL LOW (ref 278–581)
Citrated Kaolin (K): 2.5 min — ABNORMAL HIGH (ref 0.8–2.1)
Citrated Kaolin (MA): 40.5 mm — ABNORMAL LOW (ref 52–69)
Citrated Kaolin (R): 8 min (ref 4.6–9.1)
Citrated Kaolin Angle: 67.2 deg (ref 63–78)
Citrated Rapid TEG (MA): <40 mm — ABNORMAL LOW (ref 52–70)

## 2024-01-09 LAB — TRIGLYCERIDES: Triglycerides: 18 mg/dL (ref <150)

## 2024-01-09 LAB — ECHO INTRAOPERATIVE TEE
Height: 73 in
Weight: 3788.38 [oz_av]

## 2024-01-09 LAB — PHOSPHORUS: Phosphorus: 5.1 mg/dL — ABNORMAL HIGH (ref 2.5–4.6)

## 2024-01-09 LAB — CG4 I-STAT (LACTIC ACID): Lactic Acid, Venous: 1.6 mmol/L (ref 0.5–1.9)

## 2024-01-09 LAB — MAGNESIUM
Magnesium: 2.1 mg/dL (ref 1.7–2.4)
Magnesium: 2.3 mg/dL (ref 1.7–2.4)
Magnesium: 2.8 mg/dL — ABNORMAL HIGH (ref 1.7–2.4)

## 2024-01-09 LAB — APTT: aPTT: 42 s — ABNORMAL HIGH (ref 24–36)

## 2024-01-09 LAB — FIBRINOGEN: Fibrinogen: <60 mg/dL — CL (ref 210–475)

## 2024-01-09 LAB — MASSIVE TRANSFUSION PROTOCOL ORDER (BLOOD BANK NOTIFICATION)

## 2024-01-09 LAB — PLATELET COUNT: Platelets: 15 10*3/uL — CL (ref 150–400)

## 2024-01-09 SURGERY — EXPLORATION POST OPERATIVE OPEN HEART
Anesthesia: General | Site: Chest

## 2024-01-09 MED ORDER — DEXMEDETOMIDINE HCL IN NACL 400 MCG/100ML IV SOLN
0.1000 ug/kg/h | INTRAVENOUS | Status: DC
  Filled 2024-01-09: qty 100

## 2024-01-09 MED ORDER — PLASMA-LYTE A IV SOLN
INTRAVENOUS | Status: AC
  Filled 2024-01-09: qty 2.5

## 2024-01-09 MED ORDER — HEPARIN 30,000 UNITS/1000 ML (OHS) CELLSAVER SOLUTION
Status: DC
  Filled 2024-01-09: qty 1000

## 2024-01-09 MED ORDER — COAGULATION FACTOR VIIA RECOMB 1 MG IV SOLR
90.0000 ug/kg | Freq: Once | INTRAVENOUS | Status: AC
  Administered 2024-01-09: 10000 ug via INTRAVENOUS
  Filled 2024-01-09: qty 5

## 2024-01-09 MED ORDER — SODIUM CHLORIDE 0.9 % IV SOLN: INTRAVENOUS | Status: DC | PRN

## 2024-01-09 MED ORDER — NOREPINEPHRINE 4 MG/250ML-% IV SOLN: 0.0000 ug/min | INTRAVENOUS | Status: DC

## 2024-01-09 MED ORDER — HEMOSTATIC AGENTS (NO CHARGE) OPTIME
TOPICAL | Status: DC | PRN
  Administered 2024-01-09: 1 via TOPICAL

## 2024-01-09 MED ORDER — ALBUMIN HUMAN 5 % IV SOLN: INTRAVENOUS | Status: DC | PRN

## 2024-01-09 MED ORDER — ROCURONIUM BROMIDE 100 MG/10ML IV SOLN
INTRAVENOUS | Status: DC | PRN
  Administered 2024-01-09: 50 mg via INTRAVENOUS
  Administered 2024-01-09: 100 mg via INTRAVENOUS
  Administered 2024-01-09: 50 mg via INTRAVENOUS

## 2024-01-09 MED ORDER — SODIUM CHLORIDE 0.9% IV SOLUTION: Freq: Once | INTRAVENOUS | Status: AC

## 2024-01-09 MED ORDER — INSULIN REGULAR(HUMAN) IN NACL 100-0.9 UT/100ML-% IV SOLN
INTRAVENOUS | Status: DC
  Filled 2024-01-09: qty 100

## 2024-01-09 MED ORDER — MANNITOL 20 % IV SOLN
INTRAVENOUS | Status: DC
  Filled 2024-01-09: qty 13

## 2024-01-09 MED ORDER — TRANEXAMIC ACID 1000 MG/10ML IV SOLN
1.5000 mg/kg/h | INTRAVENOUS | Status: AC
  Administered 2024-01-09: 1.5 mg/kg/h via INTRAVENOUS
  Filled 2024-01-09: qty 25

## 2024-01-09 MED ORDER — CEFAZOLIN SODIUM-DEXTROSE 2-4 GM/100ML-% IV SOLN: 2.0000 g | INTRAVENOUS | Status: DC

## 2024-01-09 MED ORDER — CEFAZOLIN SODIUM-DEXTROSE 2-4 GM/100ML-% IV SOLN
2.0000 g | Freq: Three times a day (TID) | INTRAVENOUS | Status: AC
  Administered 2024-01-09 – 2024-01-10 (×4): 2 g via INTRAVENOUS
  Filled 2024-01-09 (×4): qty 100

## 2024-01-09 MED ORDER — POTASSIUM CHLORIDE 2 MEQ/ML IV SOLN
80.0000 meq | INTRAVENOUS | Status: DC
  Filled 2024-01-09: qty 40

## 2024-01-09 MED ORDER — CALCIUM CHLORIDE 10 % IV SOLN
1.0000 g | Freq: Once | INTRAVENOUS | Status: AC
  Administered 2024-01-09: 1 g via INTRAVENOUS

## 2024-01-09 MED ORDER — PROPOFOL 10 MG/ML IV BOLUS
INTRAVENOUS | Status: DC | PRN
  Administered 2024-01-09: 50 mg via INTRAVENOUS

## 2024-01-09 MED ORDER — VANCOMYCIN HCL 1500 MG/300ML IV SOLN
1500.0000 mg | Freq: Once | INTRAVENOUS | Status: DC
  Filled 2024-01-09: qty 300

## 2024-01-09 MED ORDER — MIDAZOLAM HCL 2 MG/2ML IJ SOLN
INTRAMUSCULAR | Status: AC
  Filled 2024-01-09: qty 2

## 2024-01-09 MED ORDER — MILRINONE LACTATE IN DEXTROSE 20-5 MG/100ML-% IV SOLN
0.3000 ug/kg/min | INTRAVENOUS | Status: DC
  Filled 2024-01-09: qty 100

## 2024-01-09 MED ORDER — FENTANYL CITRATE (PF) 250 MCG/5ML IJ SOLN
INTRAMUSCULAR | Status: AC
  Filled 2024-01-09: qty 5

## 2024-01-09 MED ORDER — PROTAMINE SULFATE 10 MG/ML IV SOLN
INTRAVENOUS | Status: DC | PRN
  Administered 2024-01-09: 100 mg via INTRAVENOUS
  Administered 2024-01-09: 70 mg via INTRAVENOUS
  Administered 2024-01-09: 270 mg via INTRAVENOUS

## 2024-01-09 MED ORDER — 0.9 % SODIUM CHLORIDE (POUR BTL) OPTIME
TOPICAL | Status: DC | PRN
  Administered 2024-01-09: 7000 mL

## 2024-01-09 MED ORDER — VANCOMYCIN HCL 1250 MG/250ML IV SOLN
1250.0000 mg | INTRAVENOUS | Status: DC
  Filled 2024-01-09: qty 250

## 2024-01-09 MED ORDER — CEFAZOLIN SODIUM-DEXTROSE 2-4 GM/100ML-% IV SOLN
2.0000 g | INTRAVENOUS | Status: AC
  Administered 2024-01-09: 2 g via INTRAVENOUS
  Filled 2024-01-09 (×2): qty 100

## 2024-01-09 MED ORDER — MAGNESIUM SULFATE 50 % IJ SOLN
40.0000 meq | INTRAMUSCULAR | Status: DC
  Filled 2024-01-09: qty 9.85

## 2024-01-09 MED ORDER — SODIUM CHLORIDE 0.9% IV SOLUTION: Freq: Once | INTRAVENOUS | Status: DC

## 2024-01-09 MED ORDER — PROPOFOL 10 MG/ML IV BOLUS
INTRAVENOUS | Status: AC
  Filled 2024-01-09: qty 20

## 2024-01-09 MED ORDER — ALBUMIN HUMAN 5 % IV SOLN
12.5000 g | INTRAVENOUS | Status: DC | PRN
  Administered 2024-01-08 – 2024-01-09 (×6): 12.5 g via INTRAVENOUS
  Filled 2024-01-09: qty 250

## 2024-01-09 MED ORDER — LACTATED RINGERS IV SOLN: INTRAVENOUS | Status: DC | PRN

## 2024-01-09 MED ORDER — PHENYLEPHRINE HCL-NACL 20-0.9 MG/250ML-% IV SOLN: 30.0000 ug/min | INTRAVENOUS | Status: DC

## 2024-01-09 MED ORDER — NITROGLYCERIN IN D5W 200-5 MCG/ML-% IV SOLN
2.0000 ug/min | INTRAVENOUS | Status: DC
  Filled 2024-01-09: qty 250

## 2024-01-09 MED ORDER — CEFAZOLIN SODIUM-DEXTROSE 2-3 GM-%(50ML) IV SOLR
INTRAVENOUS | Status: DC | PRN
  Administered 2024-01-09: 2 g via INTRAVENOUS

## 2024-01-09 MED ORDER — HEPARIN SODIUM (PORCINE) 1000 UNIT/ML IJ SOLN
INTRAMUSCULAR | Status: DC | PRN
  Administered 2024-01-09: 34000 [IU] via INTRAVENOUS

## 2024-01-09 MED ORDER — TRANEXAMIC ACID (OHS) BOLUS VIA INFUSION
15.0000 mg/kg | INTRAVENOUS | Status: DC
  Filled 2024-01-09: qty 1611

## 2024-01-09 MED ORDER — EPINEPHRINE HCL 5 MG/250ML IV SOLN IN NS
0.0000 ug/min | INTRAVENOUS | Status: DC
  Filled 2024-01-09: qty 250

## 2024-01-09 MED ORDER — FENTANYL CITRATE (PF) 100 MCG/2ML IJ SOLN
INTRAMUSCULAR | Status: DC | PRN
  Administered 2024-01-09: 50 ug via INTRAVENOUS
  Administered 2024-01-09 (×2): 100 ug via INTRAVENOUS

## 2024-01-09 MED ORDER — VANCOMYCIN HCL 1500 MG/300ML IV SOLN
1500.0000 mg | Freq: Once | INTRAVENOUS | Status: AC
  Administered 2024-01-09: 1500 mg via INTRAVENOUS
  Filled 2024-01-09: qty 300

## 2024-01-09 MED ORDER — TRANEXAMIC ACID (OHS) PUMP PRIME SOLUTION
2.0000 mg/kg | INTRAVENOUS | Status: DC
  Filled 2024-01-09: qty 2.15

## 2024-01-09 MED ORDER — SODIUM BICARBONATE 8.4 % IV SOLN
150.0000 meq | Freq: Once | INTRAVENOUS | Status: AC
  Administered 2024-01-09: 150 meq via INTRAVENOUS

## 2024-01-09 MED ORDER — MIDAZOLAM HCL 5 MG/5ML IJ SOLN
INTRAMUSCULAR | Status: DC | PRN
  Administered 2024-01-09 (×2): 1 mg via INTRAVENOUS
  Administered 2024-01-09: 2 mg via INTRAVENOUS

## 2024-01-09 MED ORDER — CALCIUM CHLORIDE 10 % IV SOLN
INTRAVENOUS | Status: DC | PRN
  Administered 2024-01-09: 300 mg via INTRAVENOUS
  Administered 2024-01-09: 1 g via INTRAVENOUS
  Administered 2024-01-09: 100 mg via INTRAVENOUS
  Administered 2024-01-09 (×2): 300 mg via INTRAVENOUS

## 2024-01-09 SURGICAL SUPPLY — 48 items
ADAPTER CARDIO PERF ANTE/RETRO (ADAPTER) ×1 IMPLANT
ADAPTER MULTI PERFUSION 15 (ADAPTER) ×1 IMPLANT
BNDG ELASTIC 4INX 5YD STR LF (GAUZE/BANDAGES/DRESSINGS) ×1 IMPLANT
BNDG ELASTIC 6INX 5YD STR LF (GAUZE/BANDAGES/DRESSINGS) ×1 IMPLANT
BNDG GAUZE DERMACEA FLUFF 4 (GAUZE/BANDAGES/DRESSINGS) ×1 IMPLANT
CANNULA ARTERIAL NVNT 3/8 20FR (MISCELLANEOUS) ×1 IMPLANT
CANNULA EZ GLIDE 8.0 24FR (CANNULA) ×1 IMPLANT
CANNULA GUNDRY RCSP 15FR (MISCELLANEOUS) ×1 IMPLANT
CANNULA MC2 2 STG 36/46 NON-V (CANNULA) ×1 IMPLANT
CANNULA SUMP PERICARDIAL (CANNULA) ×1 IMPLANT
CANNULA VESSEL 3MM BLUNT TIP (CANNULA) ×1 IMPLANT
CATH HEART VENT LEFT (CATHETERS) ×1 IMPLANT
CATH ROBINSON RED A/P 18FR (CATHETERS) ×2 IMPLANT
DERMABOND ADVANCED .7 DNX12 (GAUZE/BANDAGES/DRESSINGS) ×1 IMPLANT
DRSG AQUACEL AG ADV 3.5X 6 (GAUZE/BANDAGES/DRESSINGS) ×2 IMPLANT
DRSG COVADERM 4X14 (GAUZE/BANDAGES/DRESSINGS) ×1 IMPLANT
FELT TEFLON 1X6 (MISCELLANEOUS) ×2 IMPLANT
GAUZE SPONGE 4X4 12PLY STRL (GAUZE/BANDAGES/DRESSINGS) ×1 IMPLANT
GLOVE SS BIOGEL STRL SZ 7.5 (GLOVE) ×3 IMPLANT
GOWN STRL REUS W/ TWL LRG LVL3 (GOWN DISPOSABLE) ×3 IMPLANT
GOWN STRL REUS W/ TWL XL LVL3 (GOWN DISPOSABLE) ×3 IMPLANT
KIT SUCTION CATH 14FR (SUCTIONS) ×2 IMPLANT
KIT TURNOVER KIT B (KITS) ×1 IMPLANT
LINE VENT (MISCELLANEOUS) ×1 IMPLANT
NS IRRIG 1000ML POUR BTL (IV SOLUTION) ×7 IMPLANT
PACK E OPEN HEART (SUTURE) ×1 IMPLANT
PACK OPEN HEART (CUSTOM PROCEDURE TRAY) ×1 IMPLANT
POWDER SURGICEL 3.0 GRAM (HEMOSTASIS) ×1 IMPLANT
SEALANT PATCH FIBRIN 2X4IN (MISCELLANEOUS) ×2 IMPLANT
SET MPS 3-ND DEL (MISCELLANEOUS) ×1 IMPLANT
STAPLER SKIN PROX 35W (STAPLE) ×1 IMPLANT
STAPLER SKIN PROX WIDE 3.9 (STAPLE) ×1 IMPLANT
SUT MNCRL AB 3-0 PS2 18 (SUTURE) ×1 IMPLANT
SUT PROLENE 4 0 SH DA (SUTURE) ×3 IMPLANT
SUT PROLENE 4-0 RB1 .5 CRCL 36 (SUTURE) ×18 IMPLANT
SUT PROLENE 5 0 C 1 36 (SUTURE) ×6 IMPLANT
SUT PROLENE 7 0 BV1 MDA (SUTURE) ×1 IMPLANT
SUT STEEL 6MS V (SUTURE) ×1 IMPLANT
SUT STEEL SZ 6 DBL 3X14 BALL (SUTURE) ×1 IMPLANT
SUT VIC AB 1 CTX36XBRD ANBCTR (SUTURE) ×2 IMPLANT
SUT VIC AB 2-0 CT1 TAPERPNT 27 (SUTURE) ×1 IMPLANT
SYSTEM SAHARA CHEST DRAIN ATS (WOUND CARE) ×2 IMPLANT
TAPE CLOTH SURG 4X10 WHT LF (GAUZE/BANDAGES/DRESSINGS) ×1 IMPLANT
TAPE PAPER 2X10 WHT MICROPORE (GAUZE/BANDAGES/DRESSINGS) ×1 IMPLANT
TOWEL GREEN STERILE FF (TOWEL DISPOSABLE) ×3 IMPLANT
TUBE SUCT INTRACARD DLP 20F (MISCELLANEOUS) ×1 IMPLANT
TUBE SUCTION CARDIAC 10FR (CANNULA) ×1 IMPLANT
WATER STERILE IRR 1000ML POUR (IV SOLUTION) ×2 IMPLANT

## 2024-01-09 NOTE — Transfer of Care (Addendum)
 Immediate Anesthesia Transfer of Care Note  Patient: Jeremy [name]  Procedure(s) Performed: EXPLORATION POST OPERATIVE OPEN HEART BLEEDING (Chest)  Patient Location: SICU  Anesthesia Type:General  Level of Consciousness: Patient remains intubated per anesthesia plan  Airway & Oxygen Therapy: Patient remains intubated per anesthesia plan  Post-op Assessment: Report given to RN and Post -op Vital signs reviewed and unstable, Anesthesiologist notified  Post vital signs: unstable  Last Vitals:                          Vitals shown include unfiled device data.  Last Pain:      Patients Stated Pain Goal: 0 (01/08/24 0400)  Complications: No notable events documented. Patient remains in critical condition on multiple vasopressor infusions following re-exploration of chest for bleeding.

## 2024-01-09 NOTE — Progress Notes (Signed)
 Patient arrived from OR at 18. MTP continued. Per Elspeth Millers, MD 2 FFP, 1 Cryo, and 2 Plts given.

## 2024-01-09 NOTE — Transfer of Care (Signed)
 Immediate Anesthesia Transfer of Care Note  Patient: Jeremy [name]  Procedure(s) Performed: REVISION OF AORTIC ROOT GRAFT AND CORONARY ARTERY BYPASS GRAFT TIMES ONE (Chest)  Patient Location: ICU  Anesthesia Type:General  Level of Consciousness: sedated and Patient remains intubated per anesthesia plan  Airway & Oxygen Therapy: Patient re-intubated and Patient remains intubated per anesthesia plan  Post-op Assessment: Report given to RN and Post -op Vital signs reviewed and stable  Post vital signs: Reviewed and stable  Last Vitals:  Vitals Value Taken Time  BP 105/67 01/09/24 07:51  Temp 36.2 C 01/09/24 07:58  Pulse 80 01/09/24 07:58  Resp 20 01/09/24 07:58  SpO2 93 % 01/09/24 07:58  Vitals shown include unfiled device data.  Last Pain:  Vitals:   01/08/24 0636  TempSrc:   PainSc: 0-No pain      Patients Stated Pain Goal: 0 (01/08/24 0400)  Complications: No notable events documented.

## 2024-01-09 NOTE — Anesthesia Postprocedure Evaluation (Signed)
 Anesthesia Post Note  Patient: Jeremy [name]  Procedure(s) Performed: EXPLORATION POST OPERATIVE OPEN HEART BLEEDING (Chest)     Patient location during evaluation: SICU Anesthesia Type: General Level of consciousness: sedated Pain management: pain level controlled Vital Signs Assessment: vitals unstable Respiratory status: patient remains intubated per anesthesia plan Cardiovascular status: unstable Postop Assessment: no apparent nausea or vomiting Anesthetic complications: no Comments: Critical condition following re-exploration for bleeding. Multiple units of products, multiple vasopressor infusions.   No notable events documented.  Last Vitals:  Vitals:   01/09/24 0127 01/09/24 0128  BP:    Pulse: 87 88  Resp: (!) 23 (!) 22  Temp: (!) 35 C (!) 35 C  SpO2: 97% 96%    Last Pain:  Vitals:   01/08/24 0636  TempSrc:   PainSc: 0-No pain                 Garnette FORBES Skillern

## 2024-01-09 NOTE — Op Note (Signed)
 NAME: Jeremy [name], Jeremy [name] MEDICAL RECORD NO: [id number] ACCOUNT NO: [id number] DATE OF BIRTH: [birth year] FACILITY: [facility] LOCATION: [facility] PHYSICIAN: Elspeth BROCKS. Kerrin, MD  Operative Report   DATE OF PROCEDURE: 01/08/2024  PREOPERATIVE DIAGNOSIS:  Postoperative bleeding after ascending aortic repair.  POSTOPERATIVE DIAGNOSIS:  Postoperative bleeding after ascending aortic repair.  PROCEDURE:  Re-exploration of mediastinum for bleeding.  SURGEON:  Elspeth BROCKS. Kerrin, MD  ASSISTANT:  Laurel Becket, PA  ANESTHESIA:  General.  FINDINGS:  Bleeding around proximal anastomosis near takeoff of right coronary artery, coagulopathic bleeding.  CLINICAL NOTE:  Jeremy [name] is a 43 year old gentleman who earlier in the day had undergone a valve-sparing aortic root replacement and repair of a type 1 aortic intramural hematoma complicated by bleeding and coagulopathy.  After being transported to the ICU, he had high volume chest tube output and was requiring increasing doses of pressors to maintain his blood pressure.  He was transported emergently back to the operating room for re-exploration.  The need for re-exploration was discussed with the patient's mother by telephone and she understood and agreed to the procedure.  OPERATIVE NOTE:  Jeremy [name]  was brought emergently to the operating room on the evening of 01/08/2024.  He was placed back on the operative table.  The chest, abdomen, and legs were prepped and draped in the usual sterile fashion.  Additional dose of intravenous antibiotics was administered.  A timeout was performed.  The median sternotomy incision was reopened.  The sternal wires were removed.  A retractor was placed.  There was both fresh arterial blood and clot in the anterior mediastinum.  Inspection revealed the bleeding to be coming from the proximal anastomosis in the vicinity of the right coronary ostium, which made it very difficult to access the area.  The patient  had massive bleeding requiring ongoing resuscitation with blood products.  Multiple 4-0 pledgeted horizontal mattress sutures were placed with good control of the bleeding, however, the patient continued to have coagulopathic bleeding and resuscitation was ongoing.  After repair of the surgical bleeding, there was a significant improvement in his hemodynamics and significantly decreased pressor requirement.  Evarrest topical hemostatic agent was placed around the sites of the repair.  The chest tubes were cleared and then placed back into their locations.  The sternum was closed with a combination of single and double heavy gauge stainless steel wires.  After closing the sternum, the patient was observed and remained hemodynamically stable.  There was moderate bleeding, but much less than there had been previously.  The pectoralis fascia and subcutaneous tissue were closed.  Prior to closing the skin, a chest x-ray was performed because no counts were done prior to the emergent procedure.  There was a curved metallic density in the left neck, which was not part of the operative field.  This was thought to be external to the patient.  There were no retained foreign bodies in the operative field.  The skin then was closed with a subcuticular suture.  The patient was observed in the operating room and remained hemodynamically stable and bleeding rate was decreasing.  Decision was made to transport the patient back to the surgical intensive care unit.  He was taken in critical but stable condition.   SHW D: 01/08/2024 11:59:57 pm T: 01/09/2024 12:34:00 am  JOB: [id number]/ [id number]

## 2024-01-09 NOTE — Anesthesia Preprocedure Evaluation (Signed)
 Anesthesia Evaluation  Patient identified by MRN, date of birth, ID band Patient unresponsive  General Assessment Comment:New onset CV instability with dumping of blood via chest tubes. Surgeon made decision to return to OR for further exploration.  Reviewed: Allergy & Precautions, NPO status , Patient's Chart, lab work & pertinent test results, Unable to perform ROS - Chart review onlyPreop documentation limited or incomplete due to emergent nature of procedure.  Airway Mallampati: Intubated       Dental   Pulmonary Current Smoker      + intubated    Cardiovascular hypertension, +CHF   Rhythm:Regular Rate:Tachycardia  POD#1 S/p REPAIR, ANEURYSM, AORTA, THORACIC, ASCENDING USING GELWEAVE GRAFT - REPAIR TYPE 1 POD#1 s/p re-exploration of chest for bleeding   Neuro/Psych    GI/Hepatic   Endo/Other    Renal/GU Renal disease (AKI)     Musculoskeletal   Abdominal   Peds  Hematology  (+) Blood dyscrasia, anemia   Anesthesia Other Findings   Reproductive/Obstetrics                              Anesthesia Physical Anesthesia Plan  ASA: 5 and emergent  Anesthesia Plan: General   Post-op Pain Management:    Induction: Intravenous  PONV Risk Score and Plan: 1  Airway Management Planned: Oral ETT  Additional Equipment: Arterial line, CVP, PA Cath and TEE  Intra-op Plan:   Post-operative Plan: Post-operative intubation/ventilation  Informed Consent: I have reviewed the patients History and Physical, chart, labs and discussed the procedure including the risks, benefits and alternatives for the proposed anesthesia with the patient or authorized representative who has indicated his/her understanding and acceptance.     Only emergency history available  Plan Discussed with: CRNA  Anesthesia Plan Comments: (Bring back for post-op bleeding.  TEE FOR INTRA-OP MONITORING ONLY.)          Anesthesia Quick Evaluation

## 2024-01-09 NOTE — Brief Op Note (Signed)
 01/07/2024 - 01/09/2024  6:55 AM  PATIENT:  Jeremy [name]  43 y.o. male  PRE-OPERATIVE DIAGNOSIS:  POST OPERATIVE BLEEDING  POST-OPERATIVE DIAGNOSIS:  POST OPERATIVE BLEEDING  PROCEDURES:   REVISION OF AORTIC ROOT GRAFT  CORONARY ARTERY BYPASS GRAFT TIMES ONE  SVG->RCA   OPEN SAPHENOUS VEIN HARVEST RIGHT THIGH  SURGEON:   Kerrin Elspeth BROCKS, MD - Primary  PHYSICIAN ASSISTANT:  Tiaunna Buford  ASSISTANTS: Pesare, Piyanuch, RN, Scrub Person         Lawernce Suzen LABOR, RN, RN First Assistant   ANESTHESIA:   general  EBL:   ( CellSaver blood returned to patient)  BLOOD ADMINISTERED: PRBCs     Cryoprecip     FFP      Plts   DRAINS: Mediastinal drains x 2 and right pleural drain x 1.  LOCAL MEDICATIONS USED:  NONE  COUNTS:  Correct  DICTATION: .Dragon Dictation  PLAN OF CARE: Admit to inpatient   PATIENT DISPOSITION:  ICU - intubated and hemodynamically stable.   Delay start of Pharmacological VTE agent (>24hrs) due to surgical blood loss or risk of bleeding: yes

## 2024-01-09 NOTE — Op Note (Signed)
 NAME: Jeremy [name], Jeremy [name] MEDICAL RECORD NO: [id number] ACCOUNT NO: [id number] DATE OF BIRTH: 11-Sep-1980 FACILITY: [facility] LOCATION: [facility] PHYSICIAN: Elspeth BROCKS. Kerrin, MD  Operative Report   DATE OF PROCEDURE: 01/08/2024  PREOPERATIVE DIAGNOSES: 1. Aortic root aneurysm. 2. Type 1 aortic intramural hematoma.  POSTOPERATIVE DIAGNOSES: 1. Aortic root aneurysm. 2. Type 1 aortic intramural hematoma.  PROCEDURE:  Median sternotomy, extracorporeal circulation,  Valve-sparing aortic root aneurysm repair using Gelweave 32-mm graft with repair of type 1 intramural hematoma under moderate hypothermic circulatory arrest with open distal anastomosis.  SURGEON: Elspeth BROCKS. Kerrin, MD.  ASSISTANT: Con Bend, PA.  ANESTHESIA: General.  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 Widely patent coronary ostia, tricuspid valve with no significant calcification. Tear in mid-ascending aorta medial adjacent to pulmonary artery with large hematoma. No secondary tears in the aortic arch. Severe coagulopathy.  CLINICAL NOTE: Jeremy [name] is a 43 year old gentleman with a history of cocaine abuse and hypertension along with nonischemic cardiomyopathy. He presented with hypertensive crisis with acute chest pain and he had a CT which showed an aortic intramural hematoma. He stabilized and had relief of pain with blood pressure control and was scheduled for urgent surgical correction on the morning of 01/08/2024. The complex nature of the procedure, including the possible need for circulatory arrest, were discussed with the patient and his mother. They were informed of the indications, risks, benefits, and alternatives. He accepted the risks and agreed to proceed.  OPERATIVE NOTE: Jeremy [name] was brought to the operating room on 01/08/2024. He had induction of general anesthesia. Dr. Juliene Clinton of Anesthesiology performed transesophageal echocardiography. It revealed a massively  hypertrophied left ventricle with moderate to severe aortic insufficiency and a large aortic root aneurysm. Ejection fraction was approximately 35% to 40%. There was no dissection flap in the aorta. A Foley catheter was placed. Intravenous antibiotics were administered. The chest, abdomen,  and legs were prepped and draped in the usual sterile fashion.  A timeout was performed. An incision was made in the right deltopectoral groove. The deltopectoral fat pad was identified and dissection was carried into the fat pad. The axillary artery was identified and proximal and distal control were obtained. 5000 units of heparin  was administered.  After 5 minutes, the axillary artery was clamped proximally and distally, an arteriotomy was made and an 8-mm Hemashield graft was sewn end-to side with a running 5-0 Prolene suture. The graft was de-aired and connected to the bypass circuit. The tape was left on the axillary artery distal to the anastomosis to allow adjustment of the amount of flow into the right arm. The median sternotomy was performed. Initial hemostasis was obtained. Sternal retractor was placed and was opened. The pericardium was opened. There was a small bloody pericardial effusion. There was cardiomegaly. There was a massively enlarged aorta with staining and hematoma. The remainder of the full heparin  dose was given. After confirming adequate anticoagulation with ACT measurement, the dual-stage venous cannula was placed via a pursestring suture in the right atrial appendage. The patient was placed on cardiopulmonary bypass and cooling was initiated. Flows were maintained per protocol. A left ventricular vent was placed via a pursestring suture in the right superior pulmonary vein. A retrograde cardioplegia cannula was placed via a pursestring suture in the right atrium and directed into the coronary sinus. Because of his severe AI, the plan was to give retrograde and then handheld coronary cardioplegia.  The aorta was cross-clamped. Cardiac arrest then was achieved with cold retrograde blood cardioplegia  and topical ice saline. 1.5 L of the KBC cardioplegia was administered retrograde. There was still some electrical activity, although there was a diastolic arrest. The aneurysm was transected. The tear resulting in intramural hematoma was in the mid-ascending aorta just proximal to where the cross-clamp was, but there was not enough space to allow repair with a clamp in place. Cardioplegia was administered down both the right and left coronary ostia. The ostia were free of any disease and his coronaries were relatively large in size. There was complete electrical arrest after administering the handheld cardioplegia. The sinus of Valsalva were excised, leaving the aortic valve in place. The aortic valve was a tricuspid valve with very minimal calcification. It was felt that the valve could be preserved. A 32-mm Gelweave Valsalva graft was sewn to the aortic annulus with interrupted 2-0 Ethibond horizontal mattress sutures with subannular pledgets. The graft was lowered into place and the sutures were secured with a Cor-Knot device. Inspection with a fine tip right angle revealed no gaps. By this point, the patient had cooled to 26 degrees Celsius.  Etomidate  and steroids were administered. The head was packed in ice. The patient was placed in steep Trendelenburg position. The pump flow was reduced and the aortic cross-clamp was removed, exsanguinating the patient. Inspection of the arch revealed no secondary tears. A bulldog clamp was placed on the innominate artery and antegrade cerebral perfusion was administered. There was good back bleeding from the left carotid and the left subclavian. The distal anastomosis then was performed with a running 4-0 Prolene suture. A Teflon felt pledget strip was used to buttress the aortic side of the closure. At the completion of the anastomosis, the bulldog clamp was removed from  the innominate artery and the graft was allowed to de-air. A cross-clamp then was placed on the graft and full flow was resumed. Systemic rewarming was initiated. The circulatory arrest  time was 22 minutes.   The commissural posts of the aortic valve then were sewn to the Gelweave graft with 4-0 Prolene pledgeted sutures. Thermal cautery was used to create a hole for anastomosis of the left main coronary button to the graft. This was done with a running 6-0 Prolene suture. A graft-to-graft anastomosis then was performed with a running 4-0 Prolene suture. The right coronary anastomosis was performed using a 6-0 Prolene suture.  A McGoon needle was placed in the graft via a pursestring suture and placed to suction. It should be noted that carbon dioxide was insufflated into the operative field during the entirety of the procedure. The patient was placed in Trendelenburg position. A warm dose of retrograde cardioplegia was administered as a reanimation dose of 400 mL. De-airing was performed and the aortic cross-clamp was removed. While re-warming was completed, bleeding sites were controlled with 4-0 Prolene pledgeted sutures. Epicardial pacing wires were placed on the right ventricle. When the patient had rewarmed to a core temperature of 37 degrees  Celsius, an attempt was made to wean from bypass. When the patient initially weaned, there was significant bleeding at the proximal anastomosis. The patient was placed back on full bypass. There was bleeding near the left main coronary and it was felt that it could not be safely repaired without arresting the heart. Cardioplegia cannula was placed in the ascending aorta. The aorta was cross-clamped. Cardiac arrest then was re-achieved with cold antegrade and retrograde blood KBC cardioplegia. The graft anastomosis was taken  down. Additional sutures were placed around the left main coronary button  and in the aortic annulus in that area. The remainder of the suture  line appeared to be intact without any visible issues. Graft-to-graft anastomosis was redone again using a 4-0 Prolene suture. The patient was again placed in Trendelenburg position. Retrograde cardioplegia was administered. De-airing was performed and the aortic cross-clamp was removed. Total cross-clamp time was 209 minutes.   By this point, there was significant coagulopathic bleeding. The retrograde cardioplegia cannula was removed from the right atrium. The aortic graft cardioplegia cannula was left in place for de-airing. A trial weaning was performed with the patient on epinephrine , norepinephrine , and neosynephrine along with vasopressin .  After the trial weaning was successful and there appeared to be only very mild residual AI, the patient was placed back on bypass. The left ventricular vent was removed. Additional de-airing  maneuvers were performed. The patient then weaned from bypass. The total bypass time altogether was 291 minutes.   A test dose of protamine  was administered and was well tolerated. The atrial cannula was removed. The remainder of the protamine  was administered without incident. After administering all the blood in the pump, the axillary graft was transected using a Covidien stapler. There was diffuse coagulopathic bleeding and the patient was given FFP, platelets, cryoprecipitate, and packed red blood cells in addition to cell saver. A chest tube was placed in the anterior mediastinum and Blake drains were placed in the pericardial space along the diaphragm and into the right pleural space, which was opened with the sternotomy. As resuscitation  was ongoing, the sternum was closed with a combination of single and double heavy-gauge stainless steel wires. Pectoralis fascia was closed with a #1 Vicryl suture. The patient then was observed and had ongoing bleeding. The wires were removed and a sternal retractor was replaced. Additional sutures were placed. There was ongoing  coagulopathic bleeding and no identifiable surgical bleeding. A decision was made to transport the patient to the surgical intensive care unit. The sternum was again reapproximated with heavy-gauge stainless steel wires. The pectoralis fascia and subcutaneous tissue and skin were closed in standard fashion. There was a missing needle on the counts and an x-ray was performed in the operating room. No retained foreign body was seen in the right pleural space or the mediastinum. The left pleural space was not visible on the x-ray, but had not been opened during the procedure. The patient then was transported from the operating room to the surgical intensive care unit, intubated and in critical condition.    SUJ D: 01/08/2024 11:47:39 pm T: 01/09/2024 1:09:00 am  JOB: [id number]/ [id number]

## 2024-01-09 NOTE — Progress Notes (Signed)
Patient transported to OR without any complications  

## 2024-01-09 NOTE — Progress Notes (Addendum)
 Patient returned from OR at 2330.  Patient unstable with labile blood pressure; copious chest tube drainage; low CO/CI; and low urine output. Dr. Kerrin at patient's bedside from 2330-0230 receiving critical values; directing titration of pressors and sedation; ordering blood products and albumin ; ordering labs; and manipulating/monitoring chest tubes. Multiple nurses also assisting at patient's bedside from 2330-0230. All labs, emergent blood product administrations (see paper chart), and titrations according to Dr. Chrystal verbal orders. Dr. Kerrin made decision to return to OR. Handoff given to Anesthesia team and patient transported to OR with RN, RT, and Anesthesia team at 0235.

## 2024-01-09 NOTE — Addendum Note (Signed)
 Addendum  created 01/09/24 0817 by Jeyden Coffelt C., CRNA   Clinical Note Signed, Flowsheet accepted, Intraprocedure Event edited, Intraprocedure Meds edited

## 2024-01-09 NOTE — Anesthesia Postprocedure Evaluation (Signed)
 Anesthesia Post Note  Patient: Jeremy [name]  Procedure(s) Performed: REVISION OF AORTIC ROOT GRAFT AND CORONARY ARTERY BYPASS GRAFT TIMES ONE (Chest)     Patient location during evaluation: SICU Anesthesia Type: General Level of consciousness: patient remains intubated per anesthesia plan Pain management: pain level controlled Vital Signs Assessment: vitals unstable Respiratory status: patient remains intubated per anesthesia plan Cardiovascular status: unstable (Multiple vasoactive infusions to maintain BP) Postop Assessment: no headache Anesthetic complications: no Comments: Critically ill patient returned to SICU intubated, sedated on vasoactive infusions and continued MTP protocol for blood product administration secondary to ongoing surgical blood loss.  No notable events documented.  Last Vitals:  Vitals:   01/09/24 1357 01/09/24 1400  BP:    Pulse: 79 80  Resp: (!) 7 (!) 0  Temp: (!) 36.3 C (!) 36.3 C  SpO2: 99% 99%    Last Pain:  Vitals:   01/08/24 0636  TempSrc:   PainSc: 0-No pain                 Garnette FORBES Skillern

## 2024-01-09 NOTE — Progress Notes (Signed)
 NAME:  Jeremy [name], MRN:  [id number], DOB:  [birth year], LOS: 2 ADMISSION DATE:  01/07/2024, CONSULTATION DATE:  01/08/2024 REFERRING MD:  Kerrin - TCTS, CHIEF COMPLAINT:  Chest pain   History of Present Illness:  43 year old w/ hx of cocaine abuse, thoracic aneurysm presenting with chest pain found to have contained aortic root injury and went to OR this AM for root-sparing graft repair.  Only 20 mins pump time.  Found to have large aortic defect and repair complicated by profound coagulopathy requiring great deal of transfusions.  Arrives to unit intubated on multiple pressors, sedated/paralyzed.  Pertinent Medical History:  HTN NICM Aortic dilation  Significant Hospital Events: Including procedures, antibiotic start and stop dates in addition to other pertinent events   6/25 Admit 6/26 Aortic aneurysm resection and graft repair, valve sparing. Required takeback x 2 for revision of aortic root graft, CABG x 1. Required significant blood product resuscitation for coagulopathy.  Interim History / Subjective:  POD 0-1 from valve-sparing aortic root aneurysm graft repair with takeback x 2 for bleeding 2/2 coagulopathy High CT output prompted initial return to OR 6/26PM ~1945 for mediastinal reexploration Returned to unit ~2330 Unfortunately, ongoing hemodynamic instability and continued high CT output with reduced CO/CI Returned to OR 6/27 early AM ~0235 for revision of aortic root graft, CABG x 1 Returned to unit ~0715 Some additional blood product resuscitation, slowly coming down on vasopressors Currently Epi 4, NE 3, vaso 0.04, Neo off Remains on amio, milrinone  0.125 Dex 0.7, Prop 50, insulin  gtt ABG improved on SIMV, PEEP 12, FiO2 80%  Objective:   Blood pressure 113/60, pulse (!) 104, temperature (!) 95.4 F (35.2 C), resp. rate (!) 23, height 6' 1 (1.854 m), weight 107.4 kg, SpO2 100%. PAP: (13-34)/(6-24) 27/22 CVP:  [0 mmHg-55 mmHg] 49 mmHg CO:  [2.1 L/min-4  L/min] 4 L/min CI:  [0.9 L/min/m2-1.73 L/min/m2] 1.73 L/min/m2  Vent Mode: SIMV;PRVC;PSV FiO2 (%):  [50 %] 50 % Set Rate:  [16 bmp-20 bmp] 20 bmp Vt Set:  [640 mL] 640 mL PEEP:  [5 cmH20-10 cmH20] 5 cmH20 Pressure Support:  [10 cmH20] 10 cmH20 Plateau Pressure:  [18 cmH20] 18 cmH20   Intake/Output Summary (Last 24 hours) at 01/09/2024 0740 Last data filed at 01/09/2024 9272 Gross per 24 hour  Intake 24797.26 ml  Output 87724 ml  Net 12522.26 ml   Filed Weights   01/07/24 1702 01/07/24 2100 01/08/24 0524  Weight: 117.9 kg 109.5 kg 107.4 kg   Physical Examination: General: Acutely ill-appearing middle-aged man in NAD. Intubated, sedated. HEENT: [place]/AT, anicteric sclera, PERRL 2mm sluggish, moist mucous membranes. Protruding mildly edematous tongue. Neuro: Intubated, sedated. Does not respond to verbal, tactile or noxious stimuli. Not following commands. No spontaneous movement of extremities noted on my exam. +Corneals, no cough/gag. CV: Regular rhythm, paced, no m/g/r. Median sternotomy with dressing in place, minimal sanguinous strikethrough. CT x 3 in place with sanguinous output (slowed from prior). PULM: Breathing even and unlabored on vent (SIMV, PEEP 12, FiO2 80%). Lung fields diminished at base/fine crackles. GI: Soft, nontender, nondistended. Normoactive bowel sounds. Extremities: Bilateral symmetric 1+ nonpitting LE edema noted. Skin: Warm/dry, no rashes.  Resolved problem List:   Assessment and Plan:  Postoperative vent management - Continue full vent support (4-8cc/kg IBW), SIMV per protocol - Mandatory rate 20 - Wean FiO2 for O2 sat > 90% - Daily WUA/SBT when appropriate from a hemodynamic stability standpoint - VAP bundle - Pulmonary hygiene - F/u ABG, improved today 6/27 -  PAD protocol for sedation: Precedex  and Propofol  for goal RASS -1 to -2  Postop mixed hemorrhagic, vasoplegic shock - Goal MAP > 65 - Albumin  resuscitation as tolerated - F/u global TEG,  blood product resuscitation as indicated - Epi, NE titrated to goal MAP + vaso - Phenylephrine  weaned off - Continue milrinone , amiodarone  - Trend CBC, fibrinogen  (TEG as above) - Monitor for signs of active bleeding - Transfusion per TCTS  Contained ruptured ascending aortic aneurysm- s/p root-sparing repair 01/08/24 complicated by coagulopathy Mediastinal exploration x 2 6/26PM, 6/27AM - POD#0-1 from above - Postoperative management per TCTS - Monitor CI, CO - Insulin  gtt per protocol - Continue ASA - CT management per protocol  Hx HTN - Hold home antihypertensives in th setting of hemodynamic instability/shock - Cardiac monitoring as above  Hx recent cocaine abuse - Encourage cessation - TOC resources once extubated  Critical care time:   The patient is critically ill with multiple organ system failure and requires high complexity decision making for assessment and support, frequent evaluation and titration of therapies, advanced monitoring, review of radiographic studies and interpretation of complex data.   Critical Care Time devoted to patient care services, exclusive of separately billable procedures, described in this note is 36 minutes.  Corean CHRISTELLA Fatou Dunnigan, PA-C [facility] 01/09/24 7:41 AM  Please see [email or web address] for pager details.  From 7A-7P if no response, please call [phone] After hours, please call ELink [phone]

## 2024-01-09 NOTE — Hospital Course (Addendum)
 HPI: Jeremy [name] is a 43 year old gentleman with a history of hypertension, nonischemic cardiomyopathy, cocaine abuse, and known ascending aneurysm from a CT in 2022.  Has had recent cocaine use.  Presented to the [facility] with chest pain and shortness of breath.  Sudden onset this afternoon.  Initial CT done with PE protocol showed enlarged aorta.  Repeat CT using dissection protocol showed aortic intramural hematoma in the distal ascending and proximal arch.  No dissection flap noted.  There is a very limited dissection in the common iliac, but no flow limitation.   He had immediate relief of pain with blood pressure control.  Now is in sinus rhythm with no chest pain or shortness of breath.  Dr. Kerrin reviewed the patient's diagnostic studies and determined he would benefit from surgical intervention. He reviewed the treatment options as well as the risks and benefits of surgery. [name] was agreeable to proceed with surgery.  Hospital Course: [name] was admitted to [facility] and was brought to the operating room on 01/08/24. He underwent a valve sparing aortic root aneurysm repair under hypothermic circulatory arrest. He had massive blood loss and was coagulopathic. He was transferred to the SICU and was resuscitated with blood products but required reexploration of the mediastinum with improvement of hemoynamics and he was transferred back to the SICU. He was started on IV Amiodarone  for intraoperative afib. He again developed bleeding and was taken back to the OR and underwent revision of the aortic root graft and CABG x 1 utilizing SVG to RCA with open harvest of the right greater saphenous vein. He was transferred back to the ICU in hemodynamically stable condition. He required epinephrine , vasopressin , levophed  and milrinone  postoperatively. Drips were weaned as hemodynamics tolerated. He was extubated without complication on POD1. Milrinone  was discontinued,  swan ganz catheter and arterial line were removed on POD2. He was hypertensive, Norvasc  and Lisinopril  were started.  He was transitioned to oral Amiodarone  on 01/12/2024.  He was maintaining NSR and his pacing wires were removed without difficulty.  He was transitioned to IV lasix to help facilitate diuresis.  He was hypokalemic and supplemented accordingly.  Chest tube output decreased and these were able to be removed on 01/13/2024.  He was evaluated by PT/OT who recommended CIR placement and consultation was requested.  They felt ***.  The patient diuresed very well with Lasix.  He will be transitioned to an oral dose prior to discharge.  He remained hypertensive and tachycardic and his Lopressor  dose was increased to 50 mg BID.  He was felt stable for transfer to the progressive care unit on 01/13/2024.

## 2024-01-10 DIAGNOSIS — R579 Shock, unspecified: Secondary | ICD-10-CM | POA: Diagnosis not present

## 2024-01-10 DIAGNOSIS — I5023 Acute on chronic systolic (congestive) heart failure: Secondary | ICD-10-CM | POA: Diagnosis not present

## 2024-01-10 DIAGNOSIS — N179 Acute kidney failure, unspecified: Secondary | ICD-10-CM

## 2024-01-10 DIAGNOSIS — I71019 Dissection of thoracic aorta, unspecified: Secondary | ICD-10-CM | POA: Diagnosis not present

## 2024-01-10 DIAGNOSIS — Z951 Presence of aortocoronary bypass graft: Secondary | ICD-10-CM

## 2024-01-10 DIAGNOSIS — Z9889 Other specified postprocedural states: Secondary | ICD-10-CM

## 2024-01-10 DIAGNOSIS — Z95828 Presence of other vascular implants and grafts: Secondary | ICD-10-CM

## 2024-01-10 DIAGNOSIS — D62 Acute posthemorrhagic anemia: Secondary | ICD-10-CM

## 2024-01-10 LAB — PREPARE CRYOPRECIPITATE
Unit division: 0
Unit division: 0
Unit division: 0

## 2024-01-10 LAB — BPAM FFP
Blood Product Expiration Date: 202506282359
Blood Product Expiration Date: 202506292359
Blood Product Expiration Date: 202506292359
Blood Product Expiration Date: 202507012359
Blood Product Expiration Date: 202507012359
ISSUE DATE / TIME: 202506262139
ISSUE DATE / TIME: 202506262139
ISSUE DATE / TIME: 202506262139
ISSUE DATE / TIME: 202506270048
ISSUE DATE / TIME: 202506270048
Unit Type and Rh: 6200
Unit Type and Rh: 6200
Unit Type and Rh: 6200
Unit Type and Rh: 6200
Unit Type and Rh: 6200

## 2024-01-10 LAB — BPAM CRYOPRECIPITATE
Blood Product Expiration Date: 202507012359
Blood Product Expiration Date: 202507022359
Blood Product Expiration Date: 202507022359
ISSUE DATE / TIME: 202506270216
ISSUE DATE / TIME: 202506270248
ISSUE DATE / TIME: 202506270824
Unit Type and Rh: 5100
Unit Type and Rh: 5100
Unit Type and Rh: 6200

## 2024-01-10 LAB — GLUCOSE, CAPILLARY
Glucose-Capillary: 106 mg/dL — ABNORMAL HIGH (ref 70–99)
Glucose-Capillary: 110 mg/dL — ABNORMAL HIGH (ref 70–99)
Glucose-Capillary: 112 mg/dL — ABNORMAL HIGH (ref 70–99)
Glucose-Capillary: 116 mg/dL — ABNORMAL HIGH (ref 70–99)
Glucose-Capillary: 128 mg/dL — ABNORMAL HIGH (ref 70–99)
Glucose-Capillary: 88 mg/dL (ref 70–99)
Glucose-Capillary: 92 mg/dL (ref 70–99)

## 2024-01-10 LAB — PREPARE FRESH FROZEN PLASMA
Unit division: 0
Unit division: 0
Unit division: 0

## 2024-01-10 LAB — BPAM PLATELET PHERESIS
Blood Product Expiration Date: 202506292359
Blood Product Expiration Date: 202506302359
Blood Product Expiration Date: 202506302359
Blood Product Expiration Date: 202506302359
Blood Product Expiration Date: 202506302359
ISSUE DATE / TIME: 202506270048
ISSUE DATE / TIME: 202506270217
ISSUE DATE / TIME: 202506270538
ISSUE DATE / TIME: 202506270737
ISSUE DATE / TIME: 202506270824
Unit Type and Rh: 5100
Unit Type and Rh: 5100
Unit Type and Rh: 5100
Unit Type and Rh: 6200
Unit Type and Rh: 6200

## 2024-01-10 LAB — POCT I-STAT 7, (LYTES, BLD GAS, ICA,H+H)
Acid-Base Excess: 6 mmol/L — ABNORMAL HIGH (ref 0.0–2.0)
Bicarbonate: 29.7 mmol/L — ABNORMAL HIGH (ref 20.0–28.0)
Calcium, Ion: 1.17 mmol/L (ref 1.15–1.40)
HCT: 27 % — ABNORMAL LOW (ref 39.0–52.0)
Hemoglobin: 9.2 g/dL — ABNORMAL LOW (ref 13.0–17.0)
O2 Saturation: 95 %
Patient temperature: 37
Potassium: 4.1 mmol/L (ref 3.5–5.1)
Sodium: 145 mmol/L (ref 135–145)
TCO2: 31 mmol/L (ref 22–32)
pCO2 arterial: 39.7 mmHg (ref 32–48)
pH, Arterial: 7.482 — ABNORMAL HIGH (ref 7.35–7.45)
pO2, Arterial: 73 mmHg — ABNORMAL LOW (ref 83–108)

## 2024-01-10 LAB — PREPARE PLATELET PHERESIS
Unit division: 0
Unit division: 0
Unit division: 0
Unit division: 0
Unit division: 0

## 2024-01-10 LAB — COMPREHENSIVE METABOLIC PANEL WITH GFR
ALT: 24 U/L (ref 0–44)
AST: 146 U/L — ABNORMAL HIGH (ref 15–41)
Albumin: 2.6 g/dL — ABNORMAL LOW (ref 3.5–5.0)
Alkaline Phosphatase: 24 U/L — ABNORMAL LOW (ref 38–126)
Anion gap: 7 (ref 5–15)
BUN: 12 mg/dL (ref 6–20)
CO2: 29 mmol/L (ref 22–32)
Calcium: 8.2 mg/dL — ABNORMAL LOW (ref 8.9–10.3)
Chloride: 109 mmol/L (ref 98–111)
Creatinine, Ser: 1.37 mg/dL — ABNORMAL HIGH (ref 0.61–1.24)
GFR, Estimated: >60 mL/min (ref >60)
Glucose, Bld: 106 mg/dL — ABNORMAL HIGH (ref 70–99)
Potassium: 4.1 mmol/L (ref 3.5–5.1)
Sodium: 145 mmol/L (ref 135–145)
Total Bilirubin: 0.8 mg/dL (ref 0.0–1.2)
Total Protein: 4.2 g/dL — ABNORMAL LOW (ref 6.5–8.1)

## 2024-01-10 LAB — CBC WITH DIFFERENTIAL/PLATELET
Abs Immature Granulocytes: 0.03 10*3/uL (ref 0.00–0.07)
Basophils Absolute: 0 10*3/uL (ref 0.0–0.1)
Basophils Relative: 0 %
Eosinophils Absolute: 0 10*3/uL (ref 0.0–0.5)
Eosinophils Relative: 0 %
HCT: 27.9 % — ABNORMAL LOW (ref 39.0–52.0)
Hemoglobin: 9.9 g/dL — ABNORMAL LOW (ref 13.0–17.0)
Immature Granulocytes: 0 %
Lymphocytes Relative: 21 %
Lymphs Abs: 2.4 10*3/uL (ref 0.7–4.0)
MCH: 29.8 pg (ref 26.0–34.0)
MCHC: 35.5 g/dL (ref 30.0–36.0)
MCV: 84 fL (ref 80.0–100.0)
Monocytes Absolute: 0.7 10*3/uL (ref 0.1–1.0)
Monocytes Relative: 7 %
Neutro Abs: 8 10*3/uL — ABNORMAL HIGH (ref 1.7–7.7)
Neutrophils Relative %: 72 %
Platelets: 104 10*3/uL — ABNORMAL LOW (ref 150–400)
RBC: 3.32 MIL/uL — ABNORMAL LOW (ref 4.22–5.81)
RDW: 14.8 % (ref 11.5–15.5)
WBC: 11.2 10*3/uL — ABNORMAL HIGH (ref 4.0–10.5)
nRBC: 0.3 % — ABNORMAL HIGH (ref 0.0–0.2)

## 2024-01-10 MED ORDER — PANTOPRAZOLE SODIUM 40 MG IV SOLR
40.0000 mg | Freq: Every day | INTRAVENOUS | Status: DC
  Administered 2024-01-10 – 2024-01-13 (×4): 40 mg via INTRAVENOUS
  Filled 2024-01-10 (×4): qty 10

## 2024-01-10 MED ORDER — DOCUSATE SODIUM 50 MG/5ML PO LIQD
200.0000 mg | Freq: Every day | ORAL | Status: DC
  Administered 2024-01-10: 200 mg
  Filled 2024-01-10: qty 20

## 2024-01-10 MED ORDER — ORAL CARE MOUTH RINSE: 15.0000 mL | OROMUCOSAL | Status: DC | PRN

## 2024-01-10 MED ORDER — ROSUVASTATIN CALCIUM 20 MG PO TABS
40.0000 mg | ORAL_TABLET | Freq: Every day | ORAL | Status: DC
  Administered 2024-01-10: 40 mg
  Filled 2024-01-10: qty 2

## 2024-01-10 MED ORDER — INSULIN ASPART 100 UNIT/ML IJ SOLN
0.0000 [IU] | INTRAMUSCULAR | Status: DC
  Administered 2024-01-10 – 2024-01-11 (×2): 2 [IU] via SUBCUTANEOUS

## 2024-01-10 NOTE — Progress Notes (Signed)
 NAME:  Jeremy [name], MRN:  [id number], DOB:  Mar 24, 1981, LOS: 3 ADMISSION DATE:  01/07/2024, CONSULTATION DATE:  01/08/2024 REFERRING MD:  Kerrin - TCTS, CHIEF COMPLAINT:  Chest pain   History of Present Illness:  43 year old w/ hx of cocaine abuse, thoracic aneurysm presenting with chest pain found to have contained aortic root injury and went to OR this AM for root-sparing graft repair.  Only 20 mins pump time.  Found to have large aortic defect and repair complicated by profound coagulopathy requiring great deal of transfusions.  Arrives to unit intubated on multiple pressors, sedated/paralyzed.  Pertinent Medical History:  HTN NICM Aortic dilation  Significant Hospital Events: Including procedures, antibiotic start and stop dates in addition to other pertinent events   6/25 Admit 6/26 Aortic aneurysm resection and graft repair, valve sparing. Required takeback x 2 for revision of aortic root graft, CABG x 1. Required significant blood product resuscitation for coagulopathy. 6/27 went back to the OR for revision of aortic root graft due to anastomotic leak and underwent CABG x 1  Interim History / Subjective:  No overnight issues Vasopressor requirement is improving Off Levophed  Currently on epinephrine  2 mics, vasopressin  at 0.02 and milrinone  at 0.125 Propofol  was stopped, still on Precedex   Objective:   Blood pressure 105/67, pulse 69, temperature (!) 97.2 F (36.2 C), resp. rate (!) 4, height 6' 1 (1.854 m), weight 107.4 kg, SpO2 96%. PAP: (21-34)/(7-19) 28/17 CVP:  [9 mmHg-18 mmHg] 14 mmHg CO:  [3.8 L/min-5.1 L/min] 4.8 L/min CI:  [1.63 L/min/m2-2.2 L/min/m2] 2.06 L/min/m2  Vent Mode: PSV;CPAP FiO2 (%):  [40 %-60 %] 40 % Set Rate:  [20 bmp] 20 bmp Vt Set:  [640 mL] 640 mL PEEP:  [8 cmH20-12 cmH20] 8 cmH20 Pressure Support:  [8 cmH20-10 cmH20] 8 cmH20 Plateau Pressure:  [21 cmH20-22 cmH20] 21 cmH20   Intake/Output Summary (Last 24 hours) at 01/10/2024  1132 Last data filed at 01/10/2024 1100 Gross per 24 hour  Intake 3075.17 ml  Output 2415 ml  Net 660.17 ml   Filed Weights   01/07/24 1702 01/07/24 2100 01/08/24 0524  Weight: 117.9 kg 109.5 kg 107.4 kg   Physical Examination: General: Crtitically ill-appearing male, orally intubated HEENT: [place]/AT, eyes anicteric.  ETT and  OGT in place Neuro: Opens eyes with vocal stimuli, following simple commands Chest: Central sternotomy incision looks clean and dry, coarse breath sounds, no wheezes or rhonchi.  Mediastinal, chest tube and pacer wire in place Heart: Paced rhythm, no murmurs or gallops Abdomen: Soft, nondistended, bowel sounds present  Labs and images reviewed  Patient Lines/Drains/Airways Status     Active Line/Drains/Airways     Name Placement date Placement time Site Days   Arterial Line 01/08/24 Left Radial 01/08/24  0700  Radial  2   Arterial Line 01/08/24 Right Radial 01/08/24  0700  Radial  2   Peripheral IV 01/07/24 20 G Left Antecubital 01/07/24  1349  Antecubital  3   Peripheral IV 01/08/24 16 G Right Hand 01/08/24  0650  Hand  2   Chest Tube Medial Mediastinal 36 Fr. 01/08/24  1520  Mediastinal  2   NG/OG Vented/Dual Lumen Oral 67 cm 01/08/24  1000  Oral  2   Urethral Catheter K. Dyane RN Double-lumen;Latex;Temperature probe 16 Fr. 01/08/24  0802  Double-lumen;Latex;Temperature probe  2   Y Chest Tube 1 and 2 1 Medial Mediastinal 28 Fr. 2 Medial Mediastinal 28 Fr. 01/08/24  1520  -- 2   Airway 8  mm 01/08/24  0759  -- 2   Pulmonary Artery Catheter 01/08/24 Right 01/08/24  0715  -- 2   Wound 01/08/24 1148 Surgical Closed Surgical Incision Chest Other (Comment) 01/08/24  1148  Chest  2   Wound 01/08/24 1149 Surgical Closed Surgical Incision Chest Right 01/08/24  1149  Chest  2   Wound 01/09/24 0708 Surgical Closed Surgical Incision Axilla 01/09/24  0708  Axilla  1   Wound 01/09/24 0729 Surgical Closed Surgical Incision Groin Right 01/09/24  0729  Groin  1   Wound  01/09/24 0800 Pressure Injury Coccyx Bilateral Stage 2 -  Partial thickness loss of dermis presenting as a shallow open injury with a red, pink wound bed without slough. 01/09/24  0800  Coccyx  1         Resolved problem List:    Assessment and Plan:  Type A aortic dissection s/p repair Ascending aortic aneurysm with intramural hematoma s/p repair, complicated with anastomotic leak, now status post revision of aortic root graft and CABG x 1 Acute on chronic HFrEF with cardiogenic shock Acute respiratory failure with hypoxia and hypercapnia Cocaine abuse Acute kidney injury due to ischemic ATN in the setting of shock Hypernatremia, resolved Hyperphosphatemia Acute blood loss anemia and thrombocytopenia Obesity  Continue aspirin  and statin Chest tube management TCTS Chest tube output was 1050 cc in last 24 hours Continue to titrate Precedex  with RASS goal 0/-1 Continue pain control with tramadol , oxycodone  and morphine  Closely monitor chest tube output Continue titrate vasopressor support with MAP goal 65, currently on epinephrine  at 2 mics, vasopressin  at 0.02 and milrinone  at 0.125 Continue lung protective ventilation VAP prevention bundle in place Patient is tolerating spontaneous breathing trial PAD protocol with Precedex , propofol  was stopped Watch for signs of withdrawal Substance quit counseling when appropriate Serum creatinine continue to improve, monitor intake and output Closely monitor electrolytes H&H is stable, platelet counts are low but is stable Diet and exercise counseling when appropriate   Best practice  Diet/type: NPO DVT prophylaxis: SCD GI prophylaxis: PPI Lines: Central line, Arterial Line, and yes and it is still needed Foley:  Yes, and it is still needed Code Status:  full code Last date of multidisciplinary goals of care discussion [Per primary team]   The patient is critically ill due to type A aortic dissection status postrepair/CABG/acute  respiratory failure with hypoxia.  Critical care was necessary to treat or prevent imminent or life-threatening deterioration.  Critical care was time spent personally by me on the following activities: development of treatment plan with patient and/or surrogate as well as nursing, discussions with consultants, evaluation of patient's response to treatment, examination of patient, obtaining history from patient or surrogate, ordering and performing treatments and interventions, ordering and review of laboratory studies, ordering and review of radiographic studies, pulse oximetry, re-evaluation of patient's condition and participation in multidisciplinary rounds.   During this encounter critical care time was devoted to patient care services described in this note for 38 minutes.     Valinda Novas, MD [facility] See Amion for pager If no response to pager, please call [phone] until 7pm After 7pm, Please call E-link [phone]

## 2024-01-10 NOTE — Procedures (Signed)
 Extubation Procedure Note  Patient Details:   Name: Jeremy [name] DOB: [birth year] MRN: [id number]   Airway Documentation:    Vent end date: 01/10/24 Vent end time: 1220   Evaluation  O2 sats: stable throughout Complications: No apparent complications Patient did tolerate procedure well. Bilateral Breath Sounds: Clear   Pt. Extubated and paced on two liters nasal cannula and tolerating well at this time. Cuff leak present prior to extubation,no stridor noted post.  Per MD. Orders.     Yes     [name] 01/10/2024, 12:38 PM

## 2024-01-10 NOTE — Progress Notes (Signed)
 [address]       [employer] [place] [address]             [phone]                 1 Day Post-Op Procedure(s) (LRB): REVISION OF AORTIC ROOT GRAFT AND CORONARY ARTERY BYPASS GRAFT TIMES ONE (N/A)   Events: No events Stable overnight _______________________________________________________________ Vitals: BP 105/67   Pulse 80   Temp 98.1 F (36.7 C)   Resp 20   Ht 6' 1 (1.854 m)   Wt 107.4 kg   SpO2 97%   BMI 31.24 kg/m  Filed Weights   01/07/24 1702 01/07/24 2100 01/08/24 0524  Weight: 117.9 kg 109.5 kg 107.4 kg     - Neuro: sedated.    - Cardiovascular: sinus  Drips: amio 30, epi 3, milr 0.125 vaso 0.02.   PAP: (20-40)/(7-27) 22/15 CVP:  [6 mmHg-18 mmHg] 11 mmHg CO:  [3.8 L/min-5.1 L/min] 4.8 L/min CI:  [1.63 L/min/m2-2.2 L/min/m2] 2.06 L/min/m2  - Pulm:  Vent Mode: SIMV;PRVC;PSV FiO2 (%):  [40 %-80 %] 40 % Set Rate:  [20 bmp] 20 bmp Vt Set:  [640 mL] 640 mL PEEP:  [10 cmH20-12 cmH20] 10 cmH20 Pressure Support:  [10 cmH20] 10 cmH20 Plateau Pressure:  [22 cmH20] 22 cmH20  ABG    Component Value Date/Time   PHART 7.482 (H) 01/10/2024 0438   PCO2ART 39.7 01/10/2024 0438   PO2ART 73 (L) 01/10/2024 0438   HCO3 29.7 (H) 01/10/2024 0438   TCO2 31 01/10/2024 0438   ACIDBASEDEF 1.0 01/09/2024 0709   O2SAT 95 01/10/2024 0438    - Abd: ND - Extremity: warm  .Intake/Output      06/27 0701 06/28 0700 06/28 0701 06/29 0700   I.V. (mL/kg) 5973.4 (55.6)    Blood 770    IV Piggyback 1619.1    Total Intake(mL/kg) 8362.4 (77.9)    Urine (mL/kg/hr) 2135 (0.8) 275 (1.4)   Emesis/NG output 500    Blood 2360    Chest Tube 1050 50   Total Output 6045 325   Net +2317.4 -325           _______________________________________________________________ Labs:    Latest Ref Rng & Units 01/10/2024    4:38 AM 01/10/2024    4:36 AM 01/09/2024    5:00 PM  CBC  WBC 4.0 - 10.5 K/uL  11.2  9.6   Hemoglobin 13.0 - 17.0 g/dL 9.2  9.9  89.3    Hematocrit 39.0 - 52.0 % 27.0  27.9  29.5   Platelets 150 - 400 K/uL  104  124       Latest Ref Rng & Units 01/10/2024    4:38 AM 01/10/2024    4:36 AM 01/09/2024    5:00 PM  CMP  Glucose 70 - 99 mg/dL  893  878   BUN 6 - 20 mg/dL  12  14   Creatinine 9.38 - 1.24 mg/dL  8.62  8.39   Sodium 864 - 145 mmol/L 145  145  146   Potassium 3.5 - 5.1 mmol/L 4.1  4.1  4.4   Chloride 98 - 111 mmol/L  109  107   CO2 22 - 32 mmol/L  29  29   Calcium  8.9 - 10.3 mg/dL  8.2  8.5   Total Protein 6.5 - 8.1 g/dL  4.2    Total Bilirubin 0.0 - 1.2 mg/dL  0.8    Alkaline Phos 38 -  126 U/L  24    AST 15 - 41 U/L  146    ALT 0 - 44 U/L  24      CXR: stable  _______________________________________________________________  Assessment and Plan: POD 2 s/p type A dissection repair  Neuro: wean sedation as tolerated CV: wean epi, keep milr.  Wean vaso as tolerated off levo.  Will keep pacer at back up.  Will keep swan Pulm: wean vent as tolerated Renal: creat stable GI: trickle tube feeds  Heme: stable ID: afebrile Endo: insulin  gtt Dispo: continue ICU care   Jeremy [name] 01/10/2024 8:52 AM

## 2024-01-11 ENCOUNTER — Inpatient Hospital Stay (HOSPITAL_COMMUNITY)

## 2024-01-11 DIAGNOSIS — I71019 Dissection of thoracic aorta, unspecified: Secondary | ICD-10-CM | POA: Diagnosis not present

## 2024-01-11 DIAGNOSIS — I5023 Acute on chronic systolic (congestive) heart failure: Secondary | ICD-10-CM | POA: Diagnosis not present

## 2024-01-11 DIAGNOSIS — N179 Acute kidney failure, unspecified: Secondary | ICD-10-CM | POA: Diagnosis not present

## 2024-01-11 DIAGNOSIS — R579 Shock, unspecified: Secondary | ICD-10-CM | POA: Diagnosis not present

## 2024-01-11 LAB — TYPE AND SCREEN
ABO/RH(D): O POS
Unit division: 0
Unit division: 0
Unit division: 0
Unit division: 0
Unit division: 0
Unit division: 0
Unit division: 0
Unit division: 0
Unit division: 0
Unit division: 0
Unit division: 0
Unit division: 0
Unit division: 0
Unit division: 0
Unit division: 0
Unit division: 0
Unit division: 0
Unit division: 0
Unit division: 0
Unit division: 0
Unit division: 0
Unit division: 0
Unit division: 0
Unit division: 0
Unit division: 0
Unit division: 0
Unit division: 0
Unit division: 0
Unit division: 0
Unit division: 0
Unit division: 0
Unit division: 0

## 2024-01-11 LAB — BPAM RBC
Blood Product Expiration Date: 202507232359
Blood Product Expiration Date: 202507232359
Blood Product Expiration Date: 202507232359
Blood Product Expiration Date: 202507232359
Blood Product Expiration Date: 202507232359
Blood Product Expiration Date: 202507232359
Blood Product Expiration Date: 202507232359
Blood Product Expiration Date: 202507232359
Blood Product Expiration Date: 202507232359
Blood Product Expiration Date: 202507232359
Blood Product Expiration Date: 202507232359
Blood Product Expiration Date: 202507232359
Blood Product Expiration Date: 202507232359
Blood Product Expiration Date: 202507232359
Blood Product Expiration Date: 202507232359
Blood Product Expiration Date: 202507242359
Blood Product Expiration Date: 202507242359
Blood Product Expiration Date: 202507242359
Blood Product Expiration Date: 202507242359
Blood Product Expiration Date: 202507242359
Blood Product Expiration Date: 202507252359
Blood Product Expiration Date: 202507252359
Blood Product Expiration Date: 202507252359
Blood Product Expiration Date: 202507252359
Blood Product Expiration Date: 202507252359
Blood Product Expiration Date: 202507252359
Blood Product Expiration Date: 202507252359
Blood Product Expiration Date: 202507252359
Blood Product Expiration Date: 202507252359
Blood Product Expiration Date: 202507252359
Blood Product Expiration Date: 202507252359
Blood Product Expiration Date: 202507252359
ISSUE DATE / TIME: 202506261259
ISSUE DATE / TIME: 202506261259
ISSUE DATE / TIME: 202506261540
ISSUE DATE / TIME: 202506261540
ISSUE DATE / TIME: 202506261801
ISSUE DATE / TIME: 202506261801
ISSUE DATE / TIME: 202506261923
ISSUE DATE / TIME: 202506261923
ISSUE DATE / TIME: 202506262032
ISSUE DATE / TIME: 202506262032
ISSUE DATE / TIME: 202506262032
ISSUE DATE / TIME: 202506262032
ISSUE DATE / TIME: 202506270024
ISSUE DATE / TIME: 202506270024
ISSUE DATE / TIME: 202506270024
ISSUE DATE / TIME: 202506270024
ISSUE DATE / TIME: 202506270219
ISSUE DATE / TIME: 202506270219
ISSUE DATE / TIME: 202506270219
ISSUE DATE / TIME: 202506270219
ISSUE DATE / TIME: 202506270227
ISSUE DATE / TIME: 202506270227
ISSUE DATE / TIME: 202506270227
ISSUE DATE / TIME: 202506270227
ISSUE DATE / TIME: 202506270316
ISSUE DATE / TIME: 202506270316
ISSUE DATE / TIME: 202506270316
ISSUE DATE / TIME: 202506270408
ISSUE DATE / TIME: 202506270408
ISSUE DATE / TIME: 202506270408
ISSUE DATE / TIME: 202506270408
ISSUE DATE / TIME: 202506280022
Unit Type and Rh: 5100
Unit Type and Rh: 5100
Unit Type and Rh: 5100
Unit Type and Rh: 5100
Unit Type and Rh: 5100
Unit Type and Rh: 5100
Unit Type and Rh: 5100
Unit Type and Rh: 5100
Unit Type and Rh: 5100
Unit Type and Rh: 5100
Unit Type and Rh: 5100
Unit Type and Rh: 5100
Unit Type and Rh: 5100
Unit Type and Rh: 5100
Unit Type and Rh: 5100
Unit Type and Rh: 5100
Unit Type and Rh: 5100
Unit Type and Rh: 5100
Unit Type and Rh: 5100
Unit Type and Rh: 5100
Unit Type and Rh: 5100
Unit Type and Rh: 5100
Unit Type and Rh: 5100
Unit Type and Rh: 5100
Unit Type and Rh: 5100
Unit Type and Rh: 5100
Unit Type and Rh: 5100
Unit Type and Rh: 5100
Unit Type and Rh: 5100
Unit Type and Rh: 5100
Unit Type and Rh: 5100
Unit Type and Rh: 5100

## 2024-01-11 LAB — COMPREHENSIVE METABOLIC PANEL WITH GFR
ALT: 22 U/L (ref 0–44)
AST: 116 U/L — ABNORMAL HIGH (ref 15–41)
Albumin: 2.7 g/dL — ABNORMAL LOW (ref 3.5–5.0)
Alkaline Phosphatase: 37 U/L — ABNORMAL LOW (ref 38–126)
Anion gap: 10 (ref 5–15)
BUN: 11 mg/dL (ref 6–20)
CO2: 25 mmol/L (ref 22–32)
Calcium: 7.9 mg/dL — ABNORMAL LOW (ref 8.9–10.3)
Chloride: 103 mmol/L (ref 98–111)
Creatinine, Ser: 1.02 mg/dL (ref 0.61–1.24)
GFR, Estimated: >60 mL/min (ref >60)
Glucose, Bld: 99 mg/dL (ref 70–99)
Potassium: 3.9 mmol/L (ref 3.5–5.1)
Sodium: 138 mmol/L (ref 135–145)
Total Bilirubin: 1.3 mg/dL — ABNORMAL HIGH (ref 0.0–1.2)
Total Protein: 5 g/dL — ABNORMAL LOW (ref 6.5–8.1)

## 2024-01-11 LAB — CBC WITH DIFFERENTIAL/PLATELET
Abs Immature Granulocytes: 0.08 10*3/uL — ABNORMAL HIGH (ref 0.00–0.07)
Basophils Absolute: 0.1 10*3/uL (ref 0.0–0.1)
Basophils Relative: 0 %
Eosinophils Absolute: 0.1 10*3/uL (ref 0.0–0.5)
Eosinophils Relative: 1 %
HCT: 30.1 % — ABNORMAL LOW (ref 39.0–52.0)
Hemoglobin: 10.2 g/dL — ABNORMAL LOW (ref 13.0–17.0)
Immature Granulocytes: 1 %
Lymphocytes Relative: 18 %
Lymphs Abs: 2.6 10*3/uL (ref 0.7–4.0)
MCH: 30.3 pg (ref 26.0–34.0)
MCHC: 33.9 g/dL (ref 30.0–36.0)
MCV: 89.3 fL (ref 80.0–100.0)
Monocytes Absolute: 0.6 10*3/uL (ref 0.1–1.0)
Monocytes Relative: 4 %
Neutro Abs: 11 10*3/uL — ABNORMAL HIGH (ref 1.7–7.7)
Neutrophils Relative %: 76 %
Platelets: 103 10*3/uL — ABNORMAL LOW (ref 150–400)
RBC: 3.37 MIL/uL — ABNORMAL LOW (ref 4.22–5.81)
RDW: 15.1 % (ref 11.5–15.5)
WBC: 14.5 10*3/uL — ABNORMAL HIGH (ref 4.0–10.5)
nRBC: 0 % (ref 0.0–0.2)

## 2024-01-11 LAB — GLUCOSE, CAPILLARY
Glucose-Capillary: 105 mg/dL — ABNORMAL HIGH (ref 70–99)
Glucose-Capillary: 108 mg/dL — ABNORMAL HIGH (ref 70–99)

## 2024-01-11 MED ORDER — FUROSEMIDE 40 MG PO TABS
40.0000 mg | ORAL_TABLET | Freq: Every day | ORAL | Status: DC
  Administered 2024-01-11: 40 mg via ORAL
  Filled 2024-01-11: qty 1

## 2024-01-11 MED ORDER — CHLORHEXIDINE GLUCONATE CLOTH 2 % EX PADS
6.0000 | MEDICATED_PAD | Freq: Every day | CUTANEOUS | Status: DC
  Administered 2024-01-11 – 2024-01-13 (×3): 6 via TOPICAL

## 2024-01-11 MED ORDER — LISINOPRIL 20 MG PO TABS
20.0000 mg | ORAL_TABLET | Freq: Every day | ORAL | Status: DC
  Administered 2024-01-11 – 2024-01-13 (×3): 20 mg via ORAL
  Filled 2024-01-11 (×3): qty 1

## 2024-01-11 MED ORDER — ENSURE PLUS HIGH PROTEIN PO LIQD
237.0000 mL | Freq: Two times a day (BID) | ORAL | Status: DC
  Administered 2024-01-11 – 2024-01-12 (×4): 237 mL via ORAL

## 2024-01-11 MED ORDER — DOCUSATE SODIUM 100 MG PO CAPS
200.0000 mg | ORAL_CAPSULE | Freq: Every day | ORAL | Status: DC
  Administered 2024-01-11 – 2024-01-13 (×3): 200 mg via ORAL
  Filled 2024-01-11 (×3): qty 2

## 2024-01-11 MED ORDER — LORAZEPAM 2 MG/ML IJ SOLN
1.0000 mg | Freq: Four times a day (QID) | INTRAMUSCULAR | Status: DC | PRN
  Administered 2024-01-11 (×3): 1 mg via INTRAVENOUS
  Filled 2024-01-11 (×3): qty 1

## 2024-01-11 MED ORDER — AMLODIPINE BESYLATE 10 MG PO TABS
10.0000 mg | ORAL_TABLET | Freq: Every day | ORAL | Status: DC
  Administered 2024-01-11 – 2024-01-13 (×3): 10 mg via ORAL
  Filled 2024-01-11 (×3): qty 1

## 2024-01-11 MED ORDER — ROSUVASTATIN CALCIUM 20 MG PO TABS
40.0000 mg | ORAL_TABLET | Freq: Every day | ORAL | Status: DC
  Administered 2024-01-11 – 2024-01-13 (×3): 40 mg via ORAL
  Filled 2024-01-11 (×3): qty 2

## 2024-01-11 NOTE — Progress Notes (Signed)
 PT Cancellation Note  Patient Details Name: Jeremy [name] MRN: [id number] DOB: May 09, 1981   Cancelled Treatment:    Reason Eval/Treat Not Completed: Fatigue/lethargy limiting ability to participate (in Am RN asked to hold off to remove lines. In PM pt had just worked with OT and was fatigued. Will continue to follow up as able and appropriate.)  [name], DPT, CLT  Acute Rehabilitation Services Office: [phone] (Secure chat preferred)   [name] 01/11/2024, 2:25 PM

## 2024-01-11 NOTE — Progress Notes (Signed)
 [address]       [employer] [address]             [phone]                 2 Days Post-Op Procedure(s) (LRB): REVISION OF AORTIC ROOT GRAFT AND CORONARY ARTERY BYPASS GRAFT TIMES ONE (N/A)   Events: No events extubated _______________________________________________________________ Vitals: BP 109/61   Pulse 81   Temp 99.1 F (37.3 C)   Resp (!) 21   Ht 6' 1 (1.854 m)   Wt 127 kg   SpO2 100%   BMI 36.94 kg/m  Filed Weights   01/07/24 2100 01/08/24 0524 01/11/24 0600  Weight: 109.5 kg 107.4 kg 127 kg     - Neuro: alert NAD  - Cardiovascular: sinus  Drips: amio 30,milr 0.125    PAP: (25-41)/(8-23) 35/14 CVP:  [4 mmHg-20 mmHg] 11 mmHg CO:  [7.1 L/min-11.6 L/min] 7.1 L/min CI:  [3.1 L/min/m2-5 L/min/m2] 3.1 L/min/m2  - Pulm:  EWOB  ABG    Component Value Date/Time   PHART 7.482 (H) 01/10/2024 0438   PCO2ART 39.7 01/10/2024 0438   PO2ART 73 (L) 01/10/2024 0438   HCO3 29.7 (H) 01/10/2024 0438   TCO2 31 01/10/2024 0438   ACIDBASEDEF 1.0 01/09/2024 0709   O2SAT 95 01/10/2024 0438    - Abd: ND - Extremity: warm  .Intake/Output      06/28 0701 06/29 0700 06/29 0701 06/30 0700   P.O.  240   I.V. (mL/kg) 954.9 (7.5) 41.4 (0.3)   Blood     IV Piggyback 200    Total Intake(mL/kg) 1154.9 (9.1) 281.4 (2.2)   Urine (mL/kg/hr) 1250 (0.4) 130 (0.4)   Emesis/NG output     Blood     Chest Tube 350 200   Total Output 1600 330   Net -445.1 -48.7           _______________________________________________________________ Labs:    Latest Ref Rng & Units 01/11/2024    4:32 AM 01/10/2024    4:38 AM 01/10/2024    4:36 AM  CBC  WBC 4.0 - 10.5 K/uL 14.5   11.2   Hemoglobin 13.0 - 17.0 g/dL 89.7  9.2  9.9   Hematocrit 39.0 - 52.0 % 30.1  27.0  27.9   Platelets 150 - 400 K/uL 103   104       Latest Ref Rng & Units 01/11/2024    4:32 AM 01/10/2024    4:38 AM 01/10/2024    4:36 AM  CMP  Glucose 70 - 99 mg/dL 99   893   BUN 6 - 20  mg/dL 11   12   Creatinine 9.38 - 1.24 mg/dL 8.97   8.62   Sodium 864 - 145 mmol/L 138  145  145   Potassium 3.5 - 5.1 mmol/L 3.9  4.1  4.1   Chloride 98 - 111 mmol/L 103   109   CO2 22 - 32 mmol/L 25   29   Calcium  8.9 - 10.3 mg/dL 7.9   8.2   Total Protein 6.5 - 8.1 g/dL 5.0   4.2   Total Bilirubin 0.0 - 1.2 mg/dL 1.3   0.8   Alkaline Phos 38 - 126 U/L 37   24   AST 15 - 41 U/L 116   146   ALT 0 - 44 U/L 22   24     CXR: stable  _______________________________________________________________  Assessment and Plan:  POD 3 s/p type A dissection repair  Neuro: pain controlled CV: will d/c milr, swan and A line.  Treating HTN Pulm: IS, up to chair Renal: creat stable.  Will diurese GI: on diet Heme: stable ID: afebrile Endo: ssi Dispo: continue ICU care   Jeremy [name] O Markian Glockner 01/11/2024 9:20 AM

## 2024-01-11 NOTE — Progress Notes (Signed)
 NAME:  Jeremy [name], MRN:  [id number], DOB:  July 07, 1981, LOS: 4 ADMISSION DATE:  01/07/2024, CONSULTATION DATE:  01/08/2024 REFERRING MD:  Kerrin - TCTS, CHIEF COMPLAINT:  Chest pain   History of Present Illness:  43 year old w/ hx of cocaine abuse, thoracic aneurysm presenting with chest pain found to have contained aortic root injury and went to OR this AM for root-sparing graft repair.  Only 20 mins pump time.  Found to have large aortic defect and repair complicated by profound coagulopathy requiring great deal of transfusions.  Arrives to unit intubated on multiple pressors, sedated/paralyzed.  Pertinent Medical History:  HTN NICM Aortic dilation  Significant Hospital Events: Including procedures, antibiotic start and stop dates in addition to other pertinent events   6/25 Admit 6/26 Aortic aneurysm resection and graft repair, valve sparing. Required takeback x 2 for revision of aortic root graft, CABG x 1. Required significant blood product resuscitation for coagulopathy. 6/27 went back to the OR for revision of aortic root graft due to anastomotic leak and underwent CABG x 1 6/28 Extubated  Interim History / Subjective:  Patient was successfully extubated yesterday Epinephrine  was titrated off He is off sedation Milrinone  was stopped this morning He is hypertensive in the 160s  Objective:   Blood pressure 109/61, pulse 81, temperature 99.1 F (37.3 C), resp. rate (!) 21, height 6' 1 (1.854 m), weight 127 kg, SpO2 100%. PAP: (25-41)/(8-23) 35/14 CVP:  [4 mmHg-20 mmHg] 11 mmHg CO:  [7.1 L/min-11.6 L/min] 7.1 L/min CI:  [3.1 L/min/m2-5 L/min/m2] 3.1 L/min/m2  Vent Mode: PSV;CPAP FiO2 (%):  [28 %-40 %] 28 % Set Rate:  [20 bmp] 20 bmp Vt Set:  [640 mL] 640 mL PEEP:  [8 cmH20-10 cmH20] 8 cmH20 Pressure Support:  [8 cmH20] 8 cmH20 Plateau Pressure:  [21 cmH20] 21 cmH20   Intake/Output Summary (Last 24 hours) at 01/11/2024 0920 Last data filed at 01/11/2024  0813 Gross per 24 hour  Intake 1185.88 ml  Output 1605 ml  Net -419.12 ml   Filed Weights   01/07/24 2100 01/08/24 0524 01/11/24 0600  Weight: 109.5 kg 107.4 kg 127 kg   Physical Examination: General: Middle-age obese male, lying on the bed HEENT: [place]/AT, eyes anicteric.  moist mucus membranes Neuro: Alert, awake following commands Chest: Central sternotomy incision looks clean and dry, coarse breath sounds, no wheezes or rhonchi.  Mediastinal chest tube in place Heart: Regular rate and rhythm, no murmurs or gallops Abdomen: Soft, nontender, nondistended, bowel sounds present   Labs and images reviewed  Patient Lines/Drains/Airways Status     Active Line/Drains/Airways     Name Placement date Placement time Site Days   Arterial Line 01/08/24 Left Radial 01/08/24  0700  Radial  3   Peripheral IV 01/07/24 20 G Left Antecubital 01/07/24  1349  Antecubital  4   Peripheral IV 01/08/24 16 G Right Hand 01/08/24  0650  Hand  3   Chest Tube Medial Mediastinal 36 Fr. 01/08/24  1520  Mediastinal  3   NG/OG Vented/Dual Lumen Oral 67 cm 01/08/24  1000  Oral  3   Urethral Catheter K. Dyane RN Double-lumen;Latex;Temperature probe 16 Fr. 01/08/24  0802  Double-lumen;Latex;Temperature probe  3   Y Chest Tube 1 and 2 1 Medial Mediastinal 28 Fr. 2 Medial Mediastinal 28 Fr. 01/08/24  1520  -- 3   Pulmonary Artery Catheter 01/08/24 Right 01/08/24  0715  -- 3   Wound 01/08/24 1148 Surgical Closed Surgical Incision Chest Other (Comment) 01/08/24  1148  Chest  3   Wound 01/08/24 1149 Surgical Closed Surgical Incision Chest Right 01/08/24  1149  Chest  3   Wound 01/09/24 0708 Surgical Closed Surgical Incision Axilla 01/09/24  0708  Axilla  2   Wound 01/09/24 0729 Surgical Closed Surgical Incision Groin Right 01/09/24  0729  Groin  2   Wound 01/09/24 0800 Pressure Injury Coccyx Bilateral Stage 2 -  Partial thickness loss of dermis presenting as a shallow open injury with a red, pink wound bed without  slough. 01/09/24  0800  Coccyx  2         Resolved problem List:  Hyperphosphatemia Hyponatremia  Assessment and Plan:  Type A aortic dissection s/p repair Ascending aortic aneurysm with intramural hematoma s/p repair, complicated with anastomotic leak, now status post revision of aortic root graft and CABG x 1 Acute on chronic HFrEF with cardiogenic shock Acute respiratory failure with hypoxia and hypercapnia Cocaine abuse Hypertension Acute kidney injury due to ischemic ATN in the setting of shock Acute blood loss anemia and thrombocytopenia Obesity  Continue aspirin  and statin Chest tube management TCTS Chest tube output was 350 cc in last 24 hours Remain off all sedation Continue titrate nasal cannula oxygen with O2 sat goal 92% Monitor intake and output Off all vasopressors Milrinone  was stopped He is hypertensive this morning Started on amlodipine  and lisinopril  Hold off HCTZ Serum creatinine continue to improve Counseling provided regarding quit substances Maintain SBP close to 140 H&H and platelet counts are stable Diet and exercise counseling provided   Best practice  Diet/type: Regular consistency DVT prophylaxis: SCD GI prophylaxis: PPI Lines: Central line, Arterial Line, and yes and it is still needed Foley:  Yes, and it is still needed Code Status:  full code Last date of multidisciplinary goals of care discussion [Per primary team]     Valinda Novas, MD [facility] See Amion for pager If no response to pager, please call [phone] until 7pm After 7pm, Please call E-link [phone]

## 2024-01-11 NOTE — Evaluation (Signed)
 Occupational Therapy Evaluation Patient Details Name: Jeremy [name] MRN: [id number] DOB: 1981/02/26 Today's Date: 01/11/2024   History of Present Illness   43 yo male admitted 6/25 aortic resection and graft repair valve sparing required takeback x2 revision of aortic root graft CABG x1 on 6/26, back to OR revision aortic root graft due to anastomotic leak CABG x1 on 6/27extubated 6/28 PMH cocaine abuse thoracic aneurysm HTN     Clinical Impressions Patient is s/p CABG with revision of Aortic root graft surgery resulting in functional limitations due to the deficits listed below (see OT problem list). Pt at baseline indep and helps care for mother that is blind. Pt at this time needs extensive (A) for bed mobility due to first time up and multiple lines/leads/ chest tube.  Patient will benefit from skilled OT acutely to increase independence and safety with ADLS to allow discharge Patient will benefit from intensive inpatient follow-up therapy, >3 hours/day .      If plan is discharge home, recommend the following:   Two people to help with walking and/or transfers;Two people to help with bathing/dressing/bathroom     Functional Status Assessment   Patient has had a recent decline in their functional status and demonstrates the ability to make significant improvements in function in a reasonable and predictable amount of time.     Equipment Recommendations   BSC/3in1     Recommendations for Other Services   Rehab consult     Precautions/Restrictions   Precautions Precautions: Sternal;ICD/Pacemaker Recall of Precautions/Restrictions: Impaired Precaution/Restrictions Comments: ICD external, chest tube, foley     Mobility Bed Mobility Overal bed mobility: Needs Assistance Bed Mobility: Supine to Sit, Sit to Supine     Supine to sit: +2 for physical assistance, Mod assist, HOB elevated Sit to supine: +2 for physical assistance, Mod assist, HOB elevated    General bed mobility comments: pt exiting the R side of the bed hugging heart pillow with HOB increased and moving BLE off eob to progress to sitting. OT helping elevate trunk RN managed lines. pt static sitting CGA. pt returning to supine onto R shoulder and OT lifting BLE onto bed surface.    Transfers Overall transfer level: Needs assistance Equipment used: 2 person hand held assist Transfers: Sit to/from Stand Sit to Stand: +2 safety/equipment, Mod assist, From elevated surface           General transfer comment: pt able to power up to standing. pt dizzy and asking to sit. pt progressed to standing and 3 steps toward HOB. pt able to tolerate upright for close to 1 minute      Balance Overall balance assessment: Needs assistance Sitting-balance support: No upper extremity supported, Feet supported Sitting balance-Leahy Scale: Fair     Standing balance support: No upper extremity supported, During functional activity (external therapist support) Standing balance-Leahy Scale: Fair                             ADL either performed or assessed with clinical judgement   ADL Overall ADL's : Needs assistance/impaired Eating/Feeding: Set up   Grooming: Minimal assistance   Upper Body Bathing: Moderate assistance   Lower Body Bathing: Total assistance           Toilet Transfer: +2 for physical assistance;Moderate assistance                   Vision Baseline Vision/History: 0 No visual deficits Vision Assessment?: No  apparent visual deficits     Perception         Praxis         Pertinent Vitals/Pain Pain Assessment Pain Assessment: Faces Faces Pain Scale: Hurts little more Pain Location: generalized to chest incision Pain Descriptors / Indicators: Discomfort, Guarding Pain Intervention(s): Monitored during session, Repositioned     Extremity/Trunk Assessment Upper Extremity Assessment Upper Extremity Assessment: Overall WFL for tasks  assessed;Right hand dominant;RUE deficits/detail;LUE deficits/detail RUE Deficits / Details: edema in hand LUE Deficits / Details: edema in the hand   Lower Extremity Assessment Lower Extremity Assessment: Defer to PT evaluation   Cervical / Trunk Assessment Cervical / Trunk Assessment: Other exceptions (sternal precautions from procedure)   Communication Communication Communication: No apparent difficulties   Cognition Arousal: Alert Behavior During Therapy: Flat affect Cognition: No apparent impairments                               Following commands: Intact       Cueing  General Comments      HR 90 external pacer set for 50. 4L [place]   Exercises     Shoulder Instructions      Home Living Family/patient expects to be discharged to:: Private residence Living Arrangements: Parent Available Help at Discharge: Family;Available 24 hours/day Type of Home: House Home Access: Stairs to enter [employer] of Steps: 3 Entrance Stairs-Rails: None Home Layout: One level     Bathroom Shower/Tub: Chief Strategy Officer: Standard     Home Equipment: None   Additional Comments: mother that lives at the home is legally blind and pt normally helps with her care. Pt reports family will help with mother he assumes. he states she told me not to worry about it and just get better      Prior Functioning/Environment Prior Level of Function : Independent/Modified Independent;Driving                    OT Problem List: Decreased strength;Decreased activity tolerance;Impaired balance (sitting and/or standing);Decreased safety awareness;Decreased knowledge of use of DME or AE;Decreased knowledge of precautions;Cardiopulmonary status limiting activity;Obesity   OT Treatment/Interventions: Self-care/ADL training;Therapeutic exercise;Energy conservation;DME and/or AE instruction;Modalities;Manual therapy;Therapeutic activities;Patient/family  education;Balance training      OT Goals(Current goals can be found in the care plan section)   Acute Rehab OT Goals Patient Stated Goal: to get better OT Goal Formulation: With patient Time For Goal Achievement: 01/25/24 Potential to Achieve Goals: Good   OT Frequency:  Min 2X/week    Co-evaluation              AM-PAC OT 6 Clicks Daily Activity     Outcome Measure Help from another person eating meals?: A Little Help from another person taking care of personal grooming?: A Little Help from another person toileting, which includes using toliet, bedpan, or urinal?: A Lot Help from another person bathing (including washing, rinsing, drying)?: A Lot Help from another person to put on and taking off regular upper body clothing?: A Lot Help from another person to put on and taking off regular lower body clothing?: Total 6 Click Score: 13   End of Session Equipment Utilized During Treatment: Oxygen Nurse Communication: Mobility status;Precautions  Activity Tolerance: Patient tolerated treatment well Patient left: in bed;with call bell/phone within reach;with nursing/sitter in room;with SCD's reapplied  OT Visit Diagnosis: Unsteadiness on feet (R26.81);Muscle weakness (generalized) (M62.81)  Time: 1330-1405 OT Time Calculation (min): 35 min Charges:  OT General Charges $OT Visit: 1 Visit OT Evaluation $OT Eval Moderate Complexity: 1 Mod OT Treatments $Self Care/Home Management : 8-22 mins   Brynn, OTR/L  Acute Rehabilitation Services Office: [phone] .   Ely Molt 01/11/2024, 2:59 PM

## 2024-01-11 NOTE — Discharge Summary (Signed)
 [address] [place] [address]             [phone]    [name] LEFT AGAINST MEDICAL ADVICE   Physician Discharge Summary  [name] ID: Jeremy [name] MRN: [id number] DOB/AGE: February 15, 1981 43 y.o.  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   Polysubstance abuse (HCC) 01/12/2024   Pressure injury of skin 01/12/2024   S/P ascending aortic replacement 01/08/2024   Intramural hematoma of thoracic aorta (HCC) 01/07/2024   Tobacco abuse 07/02/2018   Hypertensive heart disease without heart failure 04/27/2015   Excess ear wax 04/27/2015   Hypertensive heart disease without congestive heart failure    NICM (nonischemic cardiomyopathy) [name])      Discharge Diagnoses:  [name] Active Problem List   Diagnosis Date Noted   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   Polysubstance abuse (HCC) 01/12/2024   Pressure injury of skin 01/12/2024   S/P ascending aortic replacement 01/08/2024   Intramural hematoma of thoracic aorta (HCC) 01/07/2024   Tobacco abuse 07/02/2018   Hypertensive heart disease without heart failure 04/27/2015   Excess ear wax 04/27/2015   Hypertensive heart disease without congestive heart failure    NICM (nonischemic cardiomyopathy) (HCC)      Discharged Condition: [name] left AMA  HPI: Jeremy [name] is a 43 year old gentleman with a history of hypertension, nonischemic cardiomyopathy, cocaine abuse, and known ascending aneurysm from a CT in 2022.  Has had recent cocaine use.  [facility] with chest pain and shortness of breath.  Sudden onset this afternoon.  Initial CT done with PE protocol showed enlarged aorta.  Repeat CT using dissection protocol showed aortic intramural hematoma in Jeremy distal ascending and proximal arch.  No dissection flap noted.  There is a very limited dissection in  Jeremy common iliac, but no flow limitation.   He had immediate relief of pain with blood pressure control.  Now is in sinus rhythm with no chest pain or shortness of breath.  Dr. Kerrin reviewed Jeremy [name]'s diagnostic studies and determined he would benefit from surgical intervention. He reviewed Jeremy treatment options as well as Jeremy risks and benefits of surgery. Jeremy [name] was agreeable to proceed with surgery.  Hospital Course: Jeremy [name] was admitted to [name] and was brought to [facility] operating room on 01/08/24. He underwent a valve sparing aortic root aneurysm repair under hypothermic circulatory arrest. He had massive blood loss and was coagulopathic. He was transferred to [facility] SICU and was resuscitated with blood products but required reexploration of Jeremy mediastinum with improvement of hemoynamics and he was transferred back to [facility] SICU. He again developed bleeding and was taken back to Jeremy OR and underwent revision of Jeremy aortic root graft and CABG x 1 utilizing SVG to RCA with open harvest of Jeremy right greater saphenous vein. He was transferred back to [facility] ICU in hemodynamically stable condition. He required epinephrine , vasopressin , levophed , IV Amiodarone  and milrinone  postoperatively. Drips were weaned as hemodynamics tolerated. He was extubated without complication on POD1. Milrinone  was discontinued, swan ganz catheter and arterial line were removed on POD2. He was hypertensive, Norvasc  and Lisinopril  were started.  He was transitioned to oral Amiodarone  on 01/12/2024.  He was maintaining NSR and his pacing wires were removed without difficulty.  He was transitioned to IV lasix  to help  facilitate diuresis.  He was hypokalemic and supplemented accordingly.  Chest tube output decreased and these were able to be removed on 01/13/2024.  He was evaluated by PT/OT who recommended CIR placement and consultation was requested.  They felt he was a possible candidate but Jeremy [name]  refused CIR and preferred home health.  Jeremy [name] diuresed very well with IV Lasix  and this was later transitioned to PO Lasix . He remained hypertensive and tachycardic and his Lopressor  dose was increased to 50 mg BID. He was felt stable for transfer to Jeremy progressive care unit on 01/13/2024. Prior to transfer to Jeremy progressive unit Jeremy [name] left Jeremy ICU against medical advice on 07/02 around 2:30AM. Jeremy following morning Erin Barrett PA-C called him and sent his prescriptions to [facility] on [facility] and discussed Jeremy importance of medication compliance.   Consults: critical care  Significant Diagnostic Studies: CLINICAL DATA:  Acute aortic syndrome suspected.   EXAM: CT ANGIOGRAPHY CHEST, ABDOMEN AND PELVIS   TECHNIQUE: Noncontrast chest CT was performed.   Multidetector CT imaging through Jeremy chest, abdomen and pelvis was performed using Jeremy standard protocol during bolus administration of intravenous contrast. Multiplanar reconstructed images and MIPs were obtained and reviewed to evaluate Jeremy vascular anatomy.   RADIATION DOSE REDUCTION: This exam was performed according to Jeremy departmental dose-optimization program which includes automated exposure control, adjustment of the mA and/or kV according to [name] size and/or use of iterative reconstruction technique.   CONTRAST:  OMNIPAQUE  IOHEXOL  350 MG/ML SOLN   COMPARISON:  01/07/2024 and 01/05/2021.   FINDINGS: CTA CHEST FINDINGS   Cardiovascular: Motion artifact limits evaluation. There is intramural hematoma involving Jeremy ascending aorta and aortic arch measuring up to 2 cm in thickness. There is ascending aortic aneurysm measuring 6.7 x 6.6 cm. Aortic arch and descending aorta appear normal in size. Origin of Jeremy great vessels appears within normal limits.   Jeremy heart appears enlarged, particularly Jeremy left ventricle. There is no pericardial effusion.   Mediastinum/Nodes: Posterior  right thyroid nodule present measuring 18 mm. No enlarged lymph nodes identified. Esophagus within normal limits.   Lungs/Pleura: Mild emphysema. There is a band of atelectasis in Jeremy left lower lobe. Jeremy lungs are otherwise clear. No pleural effusion or pneumothorax.   Musculoskeletal: No chest wall abnormality. No acute or significant osseous findings.   Review of Jeremy MIP images confirms Jeremy above findings.   CTA ABDOMEN AND PELVIS FINDINGS   VASCULAR   Aorta: Normal caliber aorta without aneurysm, dissection, vasculitis or significant stenosis. There are minimal calcified plaques in Jeremy aorta.   Celiac: Patent without evidence of aneurysm, dissection, vasculitis or significant stenosis.   SMA: Patent without evidence of aneurysm, dissection, vasculitis or significant stenosis.   Renals: Both renal arteries are patent without evidence of aneurysm, dissection, vasculitis, fibromuscular dysplasia or significant stenosis. Accessory right renal artery present.   IMA: Patent without evidence of aneurysm, dissection, vasculitis or significant stenosis.   Inflow: There is a small focal dissection in Jeremy left common iliac artery. No evidence for aneurysm. External and internal iliac arteries are within normal limits on Jeremy left. Right common iliac, external iliac and internal iliac arteries are within normal limits.   Veins: No obvious venous abnormality within Jeremy limitations of this arterial phase study.   Review of Jeremy MIP images confirms Jeremy above findings.   NON-VASCULAR   Hepatobiliary: Liver is mildly enlarged. No focal liver abnormality is seen. No gallstones, gallbladder wall thickening, or biliary  dilatation.   Pancreas: Unremarkable. No pancreatic ductal dilatation or surrounding inflammatory changes.   Spleen: Normal in size without focal abnormality.   Adrenals/Urinary Tract: There is an indeterminate left adrenal nodule measuring 2 0.4 x 2.0 cm. Right  adrenal gland is within normal limits. Bilateral kidneys appear within normal limits. Jeremy bladder is within normal limits.   Stomach/Bowel: Stomach is within normal limits. Appendix appears normal. No evidence of bowel wall thickening, distention, or inflammatory changes. Sigmoid and descending colon diverticulosis present.   Lymphatic: No enlarged lymph nodes are seen.   Reproductive: Prostate is unremarkable.   Other: There is a small fat containing umbilical hernia. No abdominopelvic ascites.   Musculoskeletal: No acute or significant osseous findings.   Review of Jeremy MIP images confirms Jeremy above findings.   IMPRESSION: 1. Ascending aortic aneurysm measuring 6.7 cm with intramural hematoma measuring up to 2 cm. Findings compatible with acute aortic syndrome. Recommend urgent cardiothoracic surgery consultation. 2. Small focal dissection in Jeremy left common iliac artery. No evidence for aneurysm. 3. Cardiomegaly. 4. Mild hepatomegaly. 5. Indeterminate left adrenal nodule measuring 2 cm. Recommend nonemergent adrenal protocol CT or MRI. 6. Colonic diverticulosis.   Aortic Atherosclerosis (ICD10-I70.0) and Emphysema (ICD10-J43.9).     Electronically Signed   By: Greig Pique M.D.   On: 01/07/2024 17:11   Treatments: Surgery Operative Report    DATE OF PROCEDURE: 01/08/2024   PREOPERATIVE DIAGNOSIS:  Postoperative bleeding after ascending aortic repair.   POSTOPERATIVE DIAGNOSIS:  Postoperative bleeding after ascending aortic repair.   PROCEDURE:  Re-exploration of mediastinum for bleeding.   SURGEON:  Elspeth BROCKS. Kerrin, MD   ASSISTANT:  Laurel Becket, PA  Operative Report    DATE OF PROCEDURE: 01/08/2024   PREOPERATIVE DIAGNOSES: 1. Aortic root aneurysm. 2. Type 1 aortic intramural hematoma.   POSTOPERATIVE DIAGNOSES: 1. Aortic root aneurysm. 2. Type 1 aortic intramural hematoma.   PROCEDURE: Median sternotomy, extracorporeal circulation,  valve-sparing aortic root aneurysm repair using Gelweave 32-mm graft with repair of type 1 intramural hematoma under moderate hypothermic circulatory arrest with open distal anastomosis.   SURGEON: Elspeth BROCKS. Kerrin, MD.  Discharge Exam: Blood pressure 127/76, pulse 94, temperature 98.1 F (36.7 C), temperature source Oral, resp. rate (!) 24, height 6' 1 (1.854 m), weight 118 kg, SpO2 94%.  [name] LEFT AMA, NO DISCHARGE EXAM WAS ABLE TO BE PERFORMED  Discharge Medications: [name] WAS ON [facility] FOLLOWING MEDICATIONS WHILE IN [facility] BUT LEFT AGAINST MEDICAL ADVICE BEFORE DISCHARGE MEDICATIONS WERE ABLE TO BE SENT  Jeremy [name] has been discharged on:   1.Beta Blocker:  Yes [  x ]                              No   [   ]                              If No, reason:  2.Ace Inhibitor/ARB: Yes [   x]                                     No  [   ]  If No, reason:  3.Statin:   Yes [  x ]                  No  [   ]                  If No, reason:  4.Ecasa:  Yes  [ x ]                  No   [   ]                  If No, reason:  [name] had ACS upon admission: No  Plavix/P2Y12 inhibitor: Yes [   ]                                      No  [ x  ]      Allergies as of 01/14/2024       Reactions   Penicillins Hives   Principen [ampicillin] Hives        Medication List     ASK your doctor about these medications    amLODipine  10 MG tablet Commonly known as: NORVASC  Take 10 mg by mouth daily. Ask about: Which instructions should I use?   indomethacin 25 MG capsule Commonly known as: INDOCIN Take 25 mg by mouth 2 (two) times daily.   lisinopril -hydrochlorothiazide  20-12.5 MG tablet Commonly known as: ZESTORETIC  Take 1 tablet by mouth daily.         Signed:  Con GORMAN [place], PA-C  01/14/2024, 9:55 AM

## 2024-01-12 ENCOUNTER — Encounter (HOSPITAL_COMMUNITY): Payer: Self-pay | Admitting: Thoracic Surgery (Cardiothoracic Vascular Surgery)

## 2024-01-12 ENCOUNTER — Other Ambulatory Visit: Payer: Self-pay | Admitting: Cardiology

## 2024-01-12 DIAGNOSIS — I7410 Embolism and thrombosis of unspecified parts of aorta: Principal | ICD-10-CM

## 2024-01-12 DIAGNOSIS — R579 Shock, unspecified: Secondary | ICD-10-CM | POA: Diagnosis not present

## 2024-01-12 DIAGNOSIS — N179 Acute kidney failure, unspecified: Secondary | ICD-10-CM | POA: Diagnosis not present

## 2024-01-12 DIAGNOSIS — L8990 Pressure ulcer of unspecified site, unspecified stage: Secondary | ICD-10-CM | POA: Insufficient documentation

## 2024-01-12 DIAGNOSIS — I5023 Acute on chronic systolic (congestive) heart failure: Secondary | ICD-10-CM | POA: Diagnosis not present

## 2024-01-12 DIAGNOSIS — I71019 Dissection of thoracic aorta, unspecified: Secondary | ICD-10-CM | POA: Diagnosis not present

## 2024-01-12 DIAGNOSIS — F1910 Other psychoactive substance abuse, uncomplicated: Secondary | ICD-10-CM

## 2024-01-12 DIAGNOSIS — I7111 Aneurysm of the ascending aorta, ruptured: Secondary | ICD-10-CM

## 2024-01-12 LAB — BASIC METABOLIC PANEL WITH GFR
Anion gap: 8 (ref 5–15)
BUN: 8 mg/dL (ref 6–20)
CO2: 27 mmol/L (ref 22–32)
Calcium: 7.7 mg/dL — ABNORMAL LOW (ref 8.9–10.3)
Chloride: 98 mmol/L (ref 98–111)
Creatinine, Ser: 0.91 mg/dL (ref 0.61–1.24)
GFR, Estimated: >60 mL/min (ref >60)
Glucose, Bld: 175 mg/dL — ABNORMAL HIGH (ref 70–99)
Potassium: 3.2 mmol/L — ABNORMAL LOW (ref 3.5–5.1)
Sodium: 133 mmol/L — ABNORMAL LOW (ref 135–145)

## 2024-01-12 LAB — GLUCOSE, CAPILLARY
Glucose-Capillary: 104 mg/dL — ABNORMAL HIGH (ref 70–99)
Glucose-Capillary: 100 mg/dL — ABNORMAL HIGH (ref 70–99)
Glucose-Capillary: 107 mg/dL — ABNORMAL HIGH (ref 70–99)
Glucose-Capillary: 110 mg/dL — ABNORMAL HIGH (ref 70–99)
Glucose-Capillary: 110 mg/dL — ABNORMAL HIGH (ref 70–99)
Glucose-Capillary: 113 mg/dL — ABNORMAL HIGH (ref 70–99)
Glucose-Capillary: 113 mg/dL — ABNORMAL HIGH (ref 70–99)
Glucose-Capillary: 114 mg/dL — ABNORMAL HIGH (ref 70–99)
Glucose-Capillary: 120 mg/dL — ABNORMAL HIGH (ref 70–99)
Glucose-Capillary: 123 mg/dL — ABNORMAL HIGH (ref 70–99)
Glucose-Capillary: 127 mg/dL — ABNORMAL HIGH (ref 70–99)
Glucose-Capillary: 128 mg/dL — ABNORMAL HIGH (ref 70–99)
Glucose-Capillary: 140 mg/dL — ABNORMAL HIGH (ref 70–99)
Glucose-Capillary: 165 mg/dL — ABNORMAL HIGH (ref 70–99)
Glucose-Capillary: 69 mg/dL — ABNORMAL LOW (ref 70–99)
Glucose-Capillary: 78 mg/dL (ref 70–99)
Glucose-Capillary: 97 mg/dL (ref 70–99)

## 2024-01-12 LAB — ECHO INTRAOPERATIVE TEE
Height: 73 in
P 1/2 time: 601 ms
Weight: 3788.38 [oz_av]

## 2024-01-12 LAB — CBC WITH DIFFERENTIAL/PLATELET
Abs Immature Granulocytes: 0.11 10*3/uL — ABNORMAL HIGH (ref 0.00–0.07)
Basophils Absolute: 0 10*3/uL (ref 0.0–0.1)
Basophils Relative: 0 %
Eosinophils Absolute: 0.1 10*3/uL (ref 0.0–0.5)
Eosinophils Relative: 1 %
HCT: 27.8 % — ABNORMAL LOW (ref 39.0–52.0)
Hemoglobin: 9.2 g/dL — ABNORMAL LOW (ref 13.0–17.0)
Immature Granulocytes: 1 %
Lymphocytes Relative: 13 %
Lymphs Abs: 1.8 10*3/uL (ref 0.7–4.0)
MCH: 30.4 pg (ref 26.0–34.0)
MCHC: 33.1 g/dL (ref 30.0–36.0)
MCV: 91.7 fL (ref 80.0–100.0)
Monocytes Absolute: 0.7 10*3/uL (ref 0.1–1.0)
Monocytes Relative: 5 %
Neutro Abs: 11.4 10*3/uL — ABNORMAL HIGH (ref 1.7–7.7)
Neutrophils Relative %: 80 %
Platelets: 110 10*3/uL — ABNORMAL LOW (ref 150–400)
RBC: 3.03 MIL/uL — ABNORMAL LOW (ref 4.22–5.81)
RDW: 14.3 % (ref 11.5–15.5)
WBC: 14.1 10*3/uL — ABNORMAL HIGH (ref 4.0–10.5)
nRBC: 0.1 % (ref 0.0–0.2)

## 2024-01-12 LAB — SURGICAL PATHOLOGY

## 2024-01-12 MED ORDER — MAGNESIUM OXIDE -MG SUPPLEMENT 400 (240 MG) MG PO TABS
400.0000 mg | ORAL_TABLET | Freq: Two times a day (BID) | ORAL | Status: DC
  Administered 2024-01-12 (×2): 400 mg via ORAL
  Filled 2024-01-12 (×2): qty 1

## 2024-01-12 MED ORDER — FUROSEMIDE 10 MG/ML IJ SOLN
40.0000 mg | Freq: Two times a day (BID) | INTRAMUSCULAR | Status: DC
  Administered 2024-01-12 (×2): 40 mg via INTRAVENOUS
  Filled 2024-01-12 (×2): qty 4

## 2024-01-12 MED ORDER — LORAZEPAM 1 MG PO TABS
0.5000 mg | ORAL_TABLET | Freq: Four times a day (QID) | ORAL | Status: DC | PRN
  Administered 2024-01-14: 0.5 mg via ORAL
  Filled 2024-01-12 (×2): qty 1

## 2024-01-12 MED ORDER — POTASSIUM CHLORIDE CRYS ER 20 MEQ PO TBCR
20.0000 meq | EXTENDED_RELEASE_TABLET | Freq: Once | ORAL | Status: AC
  Administered 2024-01-12: 20 meq via ORAL
  Filled 2024-01-12: qty 1

## 2024-01-12 MED ORDER — AMIODARONE HCL 200 MG PO TABS
400.0000 mg | ORAL_TABLET | Freq: Two times a day (BID) | ORAL | Status: DC
  Administered 2024-01-12 – 2024-01-13 (×4): 400 mg via ORAL
  Filled 2024-01-12 (×4): qty 2

## 2024-01-12 MED ORDER — FUROSEMIDE 10 MG/ML IJ SOLN: 40.0000 mg | Freq: Once | INTRAMUSCULAR | Status: DC

## 2024-01-12 MED ORDER — POTASSIUM CHLORIDE CRYS ER 20 MEQ PO TBCR
20.0000 meq | EXTENDED_RELEASE_TABLET | Freq: Three times a day (TID) | ORAL | Status: DC
  Administered 2024-01-12 (×3): 20 meq via ORAL
  Filled 2024-01-12 (×3): qty 1

## 2024-01-12 MED ORDER — METOPROLOL TARTRATE 25 MG PO TABS
25.0000 mg | ORAL_TABLET | Freq: Two times a day (BID) | ORAL | Status: DC
  Administered 2024-01-12 (×2): 25 mg via ORAL
  Filled 2024-01-12 (×2): qty 1

## 2024-01-12 MED ORDER — POTASSIUM CHLORIDE CRYS ER 20 MEQ PO TBCR
20.0000 meq | EXTENDED_RELEASE_TABLET | ORAL | Status: DC
  Administered 2024-01-12: 20 meq via ORAL
  Filled 2024-01-12: qty 1

## 2024-01-12 NOTE — Progress Notes (Signed)
 NAME:  Jeremy [name], MRN:  [id number], DOB:  24-Feb-1981, LOS: 5 ADMISSION DATE:  01/07/2024, CONSULTATION DATE:  01/08/2024 REFERRING MD:  Kerrin - TCTS, CHIEF COMPLAINT:  Chest pain   History of Present Illness:  43 year old w/ hx of cocaine abuse, thoracic aneurysm presenting with chest pain found to have contained aortic root injury and went to OR this AM for root-sparing graft repair.  Only 20 mins pump time.  Found to have large aortic defect and repair complicated by profound coagulopathy requiring great deal of transfusions.  Arrives to unit intubated on multiple pressors, sedated/paralyzed.  Pertinent Medical History:  HTN NICM Aortic dilation  Significant Hospital Events: Including procedures, antibiotic start and stop dates in addition to other pertinent events   6/25 Admit 6/26 Aortic aneurysm resection and graft repair, valve sparing. Required takeback x 2 for revision of aortic root graft, CABG x 1. Required significant blood product resuscitation for coagulopathy. 6/27 went back to the OR for revision of aortic root graft due to anastomotic leak and underwent CABG x 1 6/28 Extubated  Interim History / Subjective:  Patient was successfully extubated yesterday Epinephrine  was titrated off He is off sedation Milrinone  was stopped this morning He is hypertensive in the 160s  Objective:   Blood pressure (!) 138/91, pulse (!) 103, temperature 98.5 F (36.9 C), temperature source Oral, resp. rate (!) 23, height 6' 1 (1.854 m), weight 127.9 kg, SpO2 93%. PAP: (35-38)/(15-17) 37/17 CVP:  [13 mmHg-15 mmHg] 14 mmHg CO:  [7.1 L/min] 7.1 L/min CI:  [3.1 L/min/m2] 3.1 L/min/m2      Intake/Output Summary (Last 24 hours) at 01/12/2024 1123 Last data filed at 01/12/2024 1000 Gross per 24 hour  Intake 891.27 ml  Output 4955 ml  Net -4063.73 ml   Filed Weights   01/08/24 0524 01/11/24 0600 01/12/24 0500  Weight: 107.4 kg 127 kg 127.9 kg   Physical  Examination: General: Middle-age obese male, lying on the bed HEENT: [place]/AT, eyes anicteric.  moist mucus membranes Neuro: Alert, awake following commands Chest: Central sternotomy incision looks clean and dry, coarse breath sounds, no wheezes or rhonchi.  Mediastinal chest tube in place Heart: Regular rate and rhythm, no murmurs or gallops Abdomen: Soft, nontender, nondistended, bowel sounds present   Labs and images reviewed  Patient Lines/Drains/Airways Status     Active Line/Drains/Airways     Name Placement date Placement time Site Days   Arterial Line 01/08/24 Left Radial 01/08/24  0700  Radial  3   Peripheral IV 01/07/24 20 G Left Antecubital 01/07/24  1349  Antecubital  4   Peripheral IV 01/08/24 16 G Right Hand 01/08/24  0650  Hand  3   Chest Tube Medial Mediastinal 36 Fr. 01/08/24  1520  Mediastinal  3   NG/OG Vented/Dual Lumen Oral 67 cm 01/08/24  1000  Oral  3   Urethral Catheter K. Dyane RN Double-lumen;Latex;Temperature probe 16 Fr. 01/08/24  0802  Double-lumen;Latex;Temperature probe  3   Y Chest Tube 1 and 2 1 Medial Mediastinal 28 Fr. 2 Medial Mediastinal 28 Fr. 01/08/24  1520  -- 3   Pulmonary Artery Catheter 01/08/24 Right 01/08/24  0715  -- 3   Wound 01/08/24 1148 Surgical Closed Surgical Incision Chest Other (Comment) 01/08/24  1148  Chest  3   Wound 01/08/24 1149 Surgical Closed Surgical Incision Chest Right 01/08/24  1149  Chest  3   Wound 01/09/24 0708 Surgical Closed Surgical Incision Axilla 01/09/24  0708  Axilla  2  Wound 01/09/24 0729 Surgical Closed Surgical Incision Groin Right 01/09/24  0729  Groin  2   Wound 01/09/24 0800 Pressure Injury Coccyx Bilateral Stage 2 -  Partial thickness loss of dermis presenting as a shallow open injury with a red, pink wound bed without slough. 01/09/24  0800  Coccyx  2         Resolved problem List:  Hyperphosphatemia Hyponatremia cardiogenic shock Hemorrhagic shock Assessment and Plan:    Acute resp failure w  hypoxia and hypercarbia, improving  Type A dissection s/p repair Ascending aortic aneurysm w intramural hematoma s/p repair c/b anastomotic leak s/p aortic root craft and CABG x 1  ABLA AoC HFrEF  Cocaine abuse Hypokalemia  Obesity  ABLA, thrombocytopenia, stable  P -post op per CVTS -likely pacing wires, chest tube out  -ASA, statin  -amio is changing to PO  -after wires, chest tube out  -- at some point need to figure out anticoag  -replace lytes PRN -follow BMP CBC  -antihypertensives as ordered  -likely nearing readiness to go to stepdown   PCCM will sign off please let us  know if we can be of further assistance or it pt clinical status changes   Best practice  Diet/type: Regular consistency DVT prophylaxis: SCD GI prophylaxis: PPI Lines: Central line,  Foley:  Na  Code Status:  full code Last date of multidisciplinary goals of care discussion [Per primary team]   CCT n/a  Mod MDM    Ronnald Gave MSN, AGACNP-BC [facility] Pulmonary/Critical Care Medicine Amion for pager  01/12/2024, 11:23 AM

## 2024-01-12 NOTE — Progress Notes (Addendum)
 3 Days Post-Op Procedure(s) (LRB): REVISION OF AORTIC ROOT GRAFT AND CORONARY ARTERY BYPASS GRAFT TIMES ONE (N/A) Subjective:  Jeremy [name] is a 43 year old male with cocaine abuse and thoracic aortic aneurysm who presented with chest pain, noted to have type I aortic intramural hematoma, underwent aortic root aneurysm repair, complicated with postoperative bleeding, requiring revision of aortic root graft and CABG x 1. He is post op day 3 and doing well, per the patient. He is sitting upright in the bed and watching television. He denies chest pain, shortness of breath, leg pain, or palpitations. He admits to the inability to have a bowel movement or pass gas. Although, he feels the that there is gas present. He has no other complaints at this time.   Objective: Vital signs in last 24 hours: Temp:  [98.4 F (36.9 C)-100 F (37.8 C)] 98.5 F (36.9 C) (06/30 0300) Pulse Rate:  [80-93] 90 (06/30 0700) Cardiac Rhythm: Normal sinus rhythm (06/29 1600) Resp:  [18-28] 26 (06/30 0700) BP: (118-149)/(72-92) 148/82 (06/30 0700) SpO2:  [87 %-100 %] 95 % (06/30 0700) Arterial Line BP: (149-193)/(77-95) 150/77 (06/29 1300) Weight:  [127.9 kg] 127.9 kg (06/30 0500)  Hemodynamic parameters for last 24 hours: PAP: (34-41)/(14-21) 37/17 CVP:  [11 mmHg-15 mmHg] 14 mmHg CO:  [7.1 L/min] 7.1 L/min CI:  [3.1 L/min/m2] 3.1 L/min/m2  Intake/Output from previous day: 06/29 0701 - 06/30 0700 In: 1662.5 [P.O.:1194; I.V.:468.5] Out: 4255 [Urine:3475; Chest Tube:780] Intake/Output this shift: No intake/output data recorded.  Physical Exam:  General: Well appearing, no acute distress. Slight perspiration noted on his forehead.  Cardiac: Regular rate and rhythm. Sinus rhythm @ 92. No murmurs, rubs, or gallops. 2-3+ pitting edema. Chest tubes and temporary pacing wires are intact. CXR with stable bilateral pleural effusion and cardiomegaly.  Chest: Central sternotomy incision that is clean and dry. No signs of  infection. R. Subclavian incision is clean and dry with no signs of infection.  Pulmonary: Clear and equal lung sounds bilaterally. Poor effort when taking a deep breath. Cough is moderate. No air leak noted on pleur-vac.  Abdomen: Soft, distended, and non-tender.  Extremities: R. Groin wound with staples, clean and dry.  Neuro: Grossly intact. Appropriate affect.   Lab Results: Recent Labs    01/11/24 0432 01/12/24 0530  WBC 14.5* 14.1*  HGB 10.2* 9.2*  HCT 30.1* 27.8*  PLT 103* 110*   BMET:  Recent Labs    01/11/24 0432 01/12/24 0530  NA 138 133*  K 3.9 3.2*  CL 103 98  CO2 25 27  GLUCOSE 99 175*  BUN 11 8  CREATININE 1.02 0.91  CALCIUM  7.9* 7.7*    PT/INR: No results for input(s): LABPROT, INR in the last 72 hours. ABG    Component Value Date/Time   PHART 7.482 (H) 01/10/2024 0438   HCO3 29.7 (H) 01/10/2024 0438   TCO2 31 01/10/2024 0438   ACIDBASEDEF 1.0 01/09/2024 0709   O2SAT 95 01/10/2024 0438   CBG (last 3)  Recent Labs    01/11/24 1953 01/11/24 2320 01/12/24 0325  GLUCAP 105* 108* 104*    Assessment/Plan: S/P Procedure(s) (LRB): REVISION OF AORTIC ROOT GRAFT AND CORONARY ARTERY BYPASS GRAFT TIMES ONE (N/A) Assessment and Plan:  Counseled on the importance of pulmonary hygiene and it's risk factors.  Chest tube output total at and stable. Continue monitor with CXR. Hyponatremia @ 133, down from 138 on 6/29.  2-3+ pitting edema. Use compression device and IV lasix. Monitor I/Os. Hypokalemia @ 3.2,  down from 3.9 on 6/29. Supplement IV potassium.  Renal function 1.02 and remaining stable. WBCs 14.5 w/ neutrophil predominance and holding, stress leukocytosis.  Lovenox for DVT prophylaxis.  Consult PT.    LOS: 5 days    [name] 01/12/2024   Chart reviewed, patient examined, agree with above.  He looks good overall considering the complexity and length of his surgery. Still needs a lot of diuresis but creat is normal and that should  not be a problem. Continue KCL replacement.   Heart sounds normal with no murmur.  CT output 780 cc yesterday but it is decreasing and it is thin serosanguinous. Will keep tubes in today. Expect this to continue decreasing with diuresis.   Continue IS, OOB to chair. Has not ambulated yet but PT is seeing him.

## 2024-01-12 NOTE — TOC Initial Note (Signed)
 Transition of Care [facility]) - Initial/Assessment Note    Patient Details  Name: Jeremy [name] MRN: [id number] Date of Birth: 19-Nov-1980  [facility] CM/SW Contact:    Justina Delcia Czar, RN Phone Number: [phone] 01/12/2024, 12:08 PM  Clinical Narrative:                 Spoke to pt and mother at bedside. Pt lives in home with his mother. Reports he was independent pta. Has no DME in the home.  Will need PT/OT recommendations for home.  Will continue to follow for dc needs.   Expected Discharge Plan: Home w Home Health Services Barriers to Discharge: Continued Medical Work up   Patient Goals and CMS Choice Patient states their goals for this hospitalization and ongoing recovery are:: wants to have rehab CMS Medicare.gov Compare Post Acute Care list provided to:: Patient Choice offered to / list presented to : Patient      Expected Discharge Plan and Services   Discharge Planning Services: CM Consult Post Acute Care Choice: IP Rehab Living arrangements for the past 2 months: Single Family Home                                      Prior Living Arrangements/Services Living arrangements for the past 2 months: Single Family Home Lives with:: Parents Patient language and need for interpreter reviewed:: Yes Do you feel safe going back to the place where you live?: Yes      Need for Family Participation in Patient Care: Yes (Comment) Care giver support system in place?: Yes (comment)   Criminal Activity/Legal Involvement Pertinent to Current Situation/Hospitalization: No - Comment as needed  Activities of Daily Living      Permission Sought/Granted Permission sought to share information with : Case Manager, Family Supports, PCP Permission granted to share information with : Yes, Verbal Permission Granted  Share Information with NAME: Proctor Carriker  Permission granted to share info w AGENCY: Home Health, DME, PCP  Permission granted to share  info w Relationship: mother  Permission granted to share info w Contact Information: [phone]  Emotional Assessment Appearance:: Appears older than stated age Attitude/Demeanor/Rapport: Engaged Affect (typically observed): Accepting Orientation: : Oriented to Self, Oriented to Place, Oriented to  Time, Oriented to Situation   Psych Involvement: No (comment)  Admission diagnosis:  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] Polysubstance abuse (HCC) [F19.10] 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 [place] [address] Phone: [phone] Fax: [phone]     Social Drivers of Health (SDOH) Social History: SDOH Screenings   Food Insecurity: No Food Insecurity (01/07/2024)  Housing: Low Risk  (01/08/2024)  Transportation Needs: No Transportation Needs (01/08/2024)  Utilities: Not At Risk (01/08/2024)  Depression (PHQ2-9): Low Risk  (07/02/2018)  Tobacco Use: High Risk (01/09/2024)   SDOH Interventions: Food Insecurity Interventions: Intervention Not Indicated Housing Interventions:  Intervention Not Indicated Transportation Interventions: Intervention Not Indicated Utilities Interventions: Intervention Not Indicated   Readmission Risk Interventions     No data to display

## 2024-01-12 NOTE — Progress Notes (Signed)
 Post op echo ordered

## 2024-01-12 NOTE — Progress Notes (Addendum)
 Occupational Therapy Treatment Patient Details Name: Jeremy [name] MRN: [id number] DOB: November 03, 1980 Today's Date: 01/12/2024   History of present illness 43 yo male admitted 6/25 aortic resection and graft repair valve sparing required takeback x2 revision of aortic roof graft CABG x1 on 6/26, back to OR revision aortic root graft due to anastomotic leak CABG x1 on 6/27 extubated 6/28 PMH cocaine abuse thoracic aneurysm HTN.   OT comments  Pt progressed from supine to bathroom level task to hallway then to sitting up in chair. The dressing was dry and chest tube with suction attached at the end of session. Pt tolerated 4L [place] so next session might benefit from reduction to see if he can progress. Pt demonstrates cognitive deficit and lack of awareness to d/c needs. Mother present this session and states please tell me because he wont. She stated this after OT informs patient that he will not be able to drive and will need a secondary transportation method. Patient is the driver due to mother is legally blind. Pt dismisses this education with I can handle it. Recommendation for Patient will benefit from intensive inpatient follow-up therapy, >3 hours/day   *handout for sternal precautions and 4P's are in room. Pt advised to read the handout and reinforced with verbalize education.     If plan is discharge home, recommend the following:  Two people to help with walking and/or transfers;Two people to help with bathing/dressing/bathroom   Equipment Recommendations  BSC/3in1    Recommendations for Other Services Rehab consult    Precautions / Restrictions Precautions Precautions: Sternal;ICD/Pacemaker Precaution Booklet Issued: Yes (comment) Recall of Precautions/Restrictions: Impaired Precaution/Restrictions Comments: chest tube, foley Restrictions Weight Bearing Restrictions Per Provider Order: Yes       Mobility Bed Mobility Overal bed mobility: Needs Assistance Bed Mobility:  Supine to Sit     Supine to sit: +2 for physical assistance, HOB elevated, Min assist Sit to supine: +2 for physical assistance, HOB elevated, Min assist   General bed mobility comments: Exiting the R side of the bed hugging heart pillow with HOB increased and moving BLE off eob to progress to sitting. Min assist +2 with cues to sequence, and trunk support to rise. Cues for precaution awareness.    Transfers Overall transfer level: Needs assistance Equipment used: 2 person hand held assist, Rolling walker (2 wheels) Transfers: Sit to/from Stand Sit to Stand: +2 safety/equipment, Min assist           General transfer comment: Min assist +2 for boost to stand, hugging heart pillow, reviewed precautions prior, RW to stabilize with light UE support upon standing.     Balance Overall balance assessment: Needs assistance Sitting-balance support: No upper extremity supported, Feet supported Sitting balance-Leahy Scale: Fair     Standing balance support: No upper extremity supported, During functional activity (CGA  standing at toilet.) Standing balance-Leahy Scale: Fair                             ADL either performed or assessed with clinical judgement   ADL Overall ADL's : Needs assistance/impaired Eating/Feeding: Set up   Grooming: Wash/dry hands;Contact guard assist;Standing               Lower Body Dressing: Total assistance   Toilet Transfer: Minimal assistance Toilet Transfer Details (indicate cue type and reason): static standing to void bladder Toileting- Clothing Manipulation and Hygiene: Minimal assistance Toileting - Clothing Manipulation Details (indicate cue type  and reason): incontinence of stool in the bed and no awareness     Functional mobility during ADLs: +2 for physical assistance;Minimal assistance      Extremity/Trunk Assessment Upper Extremity Assessment Upper Extremity Assessment: Right hand dominant;RUE deficits/detail RUE  Deficits / Details: edema the hand present   Lower Extremity Assessment Lower Extremity Assessment: Generalized weakness   Cervical / Trunk Assessment Cervical / Trunk Assessment: Other exceptions (sternal precautions from procedure)    Vision   Vision Assessment?: No apparent visual deficits   Perception     Praxis     Communication Communication Communication: No apparent difficulties   Cognition Arousal: Alert Behavior During Therapy: Flat affect Cognition: Cognition impaired   Orientation impairments: Situation Awareness: Intellectual awareness impaired, Online awareness impaired       OT - Cognition Comments: pt reports he was hallunicating the prior day. pt attempting to place bets on his phone with therapist in the room. pt needed cues to pause the phone use for therapy and was agreeable immediately. it was more the social lack of awareness that he needed to terminate the task. pt with slow processing and asking repetitive questions                 Following commands: Intact        Cueing   Cueing Techniques: Verbal cues, Gestural cues  Exercises      Shoulder Instructions       General Comments HR 97 on arrival 4L [place] 96% with movement HR 100s with 4L [place] 92%    Pertinent Vitals/ Pain       Pain Assessment Pain Assessment: Faces Faces Pain Scale: Hurts little more Pain Location: generalized to chest incision with movement. maintains precautions with cues Pain Descriptors / Indicators: Discomfort, Guarding Pain Intervention(s): Monitored during session, Limited activity within patient's tolerance, Premedicated before session, Repositioned  Home Living Family/patient expects to be discharged to:: Private residence Living Arrangements: Parent Available Help at Discharge: Family;Available 24 hours/day Type of Home: House Home Access: Stairs to enter [employer] of Steps: 3 Entrance Stairs-Rails: None Home Layout: One level      Bathroom Shower/Tub: Chief Strategy Officer: Standard     Home Equipment: None   Additional Comments: mother that lives at the home is legally blind and pt normally helps with her care. Pt reports family will help with mother he assumes. he states she told me not to worry about it and just get better      Prior Functioning/Environment              Frequency  Min 2X/week        Progress Toward Goals  OT Goals(current goals can now be found in the care plan section)  Progress towards OT goals: Progressing toward goals  Acute Rehab OT Goals Patient Stated Goal: to place bets on sports OT Goal Formulation: With patient Time For Goal Achievement: 01/25/24 Potential to Achieve Goals: Good ADL Goals Pt Will Perform Grooming: with contact guard assist;standing Pt Will Perform Upper Body Bathing: with contact guard assist;sitting Pt Will Transfer to Toilet: ambulating;regular height toilet;with contact guard assist Additional ADL Goal #1: pt will verbalize and demonstrate sternal precautions Additional ADL Goal #2: pt will complete bed mobility supervision level  Plan      Co-evaluation    PT/OT/SLP Co-Evaluation/Treatment: Yes Reason for Co-Treatment: Complexity of the patient's impairments (multi-system involvement);For patient/therapist safety;To address functional/ADL transfers PT goals addressed during session: Mobility/safety with mobility;Balance;Proper  use of DME;Strengthening/ROM OT goals addressed during session: ADL's and self-care;Strengthening/ROM      AM-PAC OT 6 Clicks Daily Activity     Outcome Measure   Help from another person eating meals?: A Little Help from another person taking care of personal grooming?: A Little Help from another person toileting, which includes using toliet, bedpan, or urinal?: A Lot Help from another person bathing (including washing, rinsing, drying)?: A Lot Help from another person to put on and taking off  regular upper body clothing?: A Lot Help from another person to put on and taking off regular lower body clothing?: Total 6 Click Score: 13    End of Session Equipment Utilized During Treatment: Oxygen  OT Visit Diagnosis: Unsteadiness on feet (R26.81);Muscle weakness (generalized) (M62.81)   Activity Tolerance Patient tolerated treatment well   Patient Left in chair;with call bell/phone within reach;with chair alarm set   Nurse Communication Mobility status;Precautions;Weight bearing status        Time: 8793-8765 OT Time Calculation (min): 28 min  Charges: OT General Charges $OT Visit: 1 Visit OT Treatments $Self Care/Home Management : 8-22 mins   Brynn, OTR/L  Acute Rehabilitation Services Office: [phone] .   Ely Molt 01/12/2024, 2:54 PM

## 2024-01-12 NOTE — Progress Notes (Signed)
 Patient ID: Jeremy [name], male   DOB: 29-Apr-1981, 43 y.o.   MRN: [id number]  TCTS Evening Rounds:  Hemodynamically stable in sinus rhythm.   Diuresing very well today. Continue KCL replacement.  CT output low. Will remove in am.  Seen by PT/OT today.

## 2024-01-12 NOTE — Anesthesia Postprocedure Evaluation (Signed)
 Anesthesia Post Note  Patient: PENNY FRISBIE  Procedure(s) Performed: REPAIR, ANEURYSM, AORTA, THORACIC, ASCENDING USING GELWEAVE GRAFT ECHOCARDIOGRAM, TRANSESOPHAGEAL, INTRAOPERATIVE     Patient location during evaluation: SICU Anesthesia Type: General Level of consciousness: sedated Pain management: pain level controlled Vital Signs Assessment: vitals unstable Respiratory status: patient remains intubated per anesthesia plan Cardiovascular status: unstable Postop Assessment: no apparent nausea or vomiting Anesthetic complications: no Comments: Pt is on levo, vaso, epinephrine , and milrinone  gtts.  Due to prolonged cardiopulmonary bypass, he is severely coagulopathic requiring ongoing transfusions.     No notable events documented.  Last Vitals:  Vitals:   01/12/24 0700 01/12/24 0800  BP: (!) 148/82 134/81  Pulse: 90 93  Resp: (!) 26 20  Temp:    SpO2: 95% 99%    Last Pain:  Vitals:   01/12/24 0845  TempSrc:   PainSc: 0-No pain                 Ramonica Grigg S

## 2024-01-12 NOTE — Evaluation (Signed)
 Physical Therapy Evaluation Patient Details Name: Jeremy [name] MRN: [id number] DOB: 1981/06/01 Today's Date: 01/12/2024  History of Present Illness  43 yo male admitted 6/25 aortic resection and graft repair valve sparing required takeback x2 revision of aortic roof graft CABG x1 on 6/26, back to OR revision aortic root graft due to anastomotic leak CABG x1 on 6/27 extubated 6/28 PMH cocaine abuse thoracic aneurysm HTN.  Clinical Impression  Patient is s/p above surgery resulting in functional limitations due to the deficits listed below (see PT Problem List). Ind prior to admission. Tolerated physical therapy evaluation today with min assist +2 for bed mobility, transfers, and ambulation up to 70 lightly using RW for support. Demonstrates instability in standing, and distance limited by fatigue. SpO2 92% on 4L supplemental O2 throughout. Reviewed precautions, handout provided. Tolerated LE exercises well. Patient will benefit from intensive inpatient follow-up therapy, >3 hours/day. Will update progress and recs as appropriate. Patient will benefit from acute skilled PT to increase their independence and safety with mobility to facilitate discharge.         If plan is discharge home, recommend the following: A little help with walking and/or transfers;A lot of help with bathing/dressing/bathroom;Assistance with cooking/housework;Assist for transportation;Help with stairs or ramp for entrance;Supervision due to cognitive status   Can travel by private vehicle        Equipment Recommendations Other (comment) (TBD pending progress)  Recommendations for Other Services  Rehab consult    Functional Status Assessment Patient has had a recent decline in their functional status and demonstrates the ability to make significant improvements in function in a reasonable and predictable amount of time.     Precautions / Restrictions Precautions Precautions: Sternal;ICD/Pacemaker Precaution  Booklet Issued: Yes (comment) Recall of Precautions/Restrictions: Impaired Precaution/Restrictions Comments: chest tube, foley Restrictions Weight Bearing Restrictions Per Provider Order: Yes      Mobility  Bed Mobility Overal bed mobility: Needs Assistance Bed Mobility: Supine to Sit     Supine to sit: +2 for physical assistance, HOB elevated, Min assist Sit to supine: +2 for physical assistance, HOB elevated, Min assist   General bed mobility comments: Exiting the R side of the bed hugging heart pillow with HOB increased and moving BLE off eob to progress to sitting. Min assist +2 with cues to sequence, and trunk support to rise. Cues for precaution awareness.    Transfers Overall transfer level: Needs assistance Equipment used: 2 person hand held assist, Rolling walker (2 wheels) Transfers: Sit to/from Stand Sit to Stand: +2 safety/equipment, Min assist           General transfer comment: Min assist +2 for boost to stand, hugging heart pillow, reviewed precautions prior, RW to stabilize with light UE support upon standing.    Ambulation/Gait Ambulation/Gait assistance: Min assist, +2 safety/equipment Gait Distance (Feet): 70 Feet Assistive device: Rolling walker (2 wheels) Gait Pattern/deviations: Step-through pattern, Decreased stride length Gait velocity: dec Gait velocity interpretation: <1.31 ft/sec, indicative of household ambulator   General Gait Details: Slow and guarded with intermittent min assist for RW control and +2 for equipment management. Mild instability but most of distance at [facility] level today. VSS throughout 92% on 4L supplemental O2. Cues for safety, awarness, precautions, and techniques to mobilize RW in congested areas. Walked to restroom then hallway. Did become fatigued but showed great effort.  Stairs            Wheelchair Mobility     Tilt Bed    Modified Rankin (Stroke  Patients Only)       Balance Overall balance assessment:  Needs assistance Sitting-balance support: No upper extremity supported, Feet supported Sitting balance-Leahy Scale: Fair     Standing balance support: No upper extremity supported, During functional activity (CGA  standing at toilet.) Standing balance-Leahy Scale: Fair                               Pertinent Vitals/Pain Pain Assessment Pain Assessment: Faces Faces Pain Scale: Hurts little more Pain Location: generalized to chest incision with movement. maintains precautions with cues Pain Descriptors / Indicators: Discomfort, Guarding Pain Intervention(s): Limited activity within patient's tolerance, Monitored during session, Repositioned    Home Living Family/patient expects to be discharged to:: Private residence Living Arrangements: Parent Available Help at Discharge: Family;Available 24 hours/day Type of Home: House Home Access: Stairs to enter Entrance Stairs-Rails: None Entrance Stairs-Number of Steps: 3   Home Layout: One level Home Equipment: None Additional Comments: mother that lives at the home is legally blind and pt normally helps with her care. Pt reports family will help with mother he assumes. he states she told me not to worry about it and just get better    Prior Function Prior Level of Function : Independent/Modified Independent;Driving                     Extremity/Trunk Assessment   Upper Extremity Assessment Upper Extremity Assessment: Defer to OT evaluation    Lower Extremity Assessment Lower Extremity Assessment: Generalized weakness    Cervical / Trunk Assessment Cervical / Trunk Assessment: Other exceptions (sternal precautions from procedure)  Communication   Communication Communication: No apparent difficulties    Cognition Arousal: Alert Behavior During Therapy: Flat affect   PT - Cognitive impairments: Safety/Judgement, Problem solving, Sequencing, Attention                         Following  commands: Intact       Cueing Cueing Techniques: Verbal cues, Gestural cues     General Comments General comments (skin integrity, edema, etc.): Pre activity HR 97, SpO2 96% on 4L. While ambulating Low 100s HR and SpO2 92% on 4L.    Exercises General Exercises - Lower Extremity Ankle Circles/Pumps: AROM, Both, 10 reps, Seated Quad Sets: Strengthening, Both, 10 reps, Seated Gluteal Sets: Strengthening, Both, 10 reps, Seated   Assessment/Plan    PT Assessment Patient needs continued PT services  PT Problem List Decreased strength;Decreased range of motion;Decreased activity tolerance;Decreased balance;Decreased mobility;Decreased cognition;Decreased knowledge of use of DME;Decreased safety awareness;Decreased knowledge of precautions;Cardiopulmonary status limiting activity;Pain       PT Treatment Interventions DME instruction;Gait training;Stair training;Functional mobility training;Therapeutic activities;Therapeutic exercise;Balance training;Patient/family education;Neuromuscular re-education;Cognitive remediation;Modalities    PT Goals (Current goals can be found in the Care Plan section)  Acute Rehab PT Goals Patient Stated Goal: get well return home PT Goal Formulation: With patient Time For Goal Achievement: 01/26/24 Potential to Achieve Goals: Good    Frequency Min 3X/week     Co-evaluation PT/OT/SLP Co-Evaluation/Treatment: Yes Reason for Co-Treatment: Complexity of the patient's impairments (multi-system involvement);For patient/therapist safety;To address functional/ADL transfers PT goals addressed during session: Mobility/safety with mobility;Balance;Proper use of DME;Strengthening/ROM         AM-PAC PT 6 Clicks Mobility  Outcome Measure Help needed turning from your back to your side while in a flat bed without using bedrails?: A Little Help needed moving from  lying on your back to sitting on the side of a flat bed without using bedrails?: A Lot Help needed  moving to and from a bed to a chair (including a wheelchair)?: A Lot Help needed standing up from a chair using your arms (e.g., wheelchair or bedside chair)?: A Lot Help needed to walk in hospital room?: A Lot Help needed climbing 3-5 steps with a railing? : A Lot 6 Click Score: 13    End of Session Equipment Utilized During Treatment: Gait belt;Oxygen Activity Tolerance: Patient tolerated treatment well Patient left: in chair;with call bell/phone within reach;with SCD's reapplied Nurse Communication: Mobility status PT Visit Diagnosis: Unsteadiness on feet (R26.81);Other abnormalities of gait and mobility (R26.89);Muscle weakness (generalized) (M62.81);Pain Pain - part of body:  (sternum)    Time: 8793-8765 PT Time Calculation (min) (ACUTE ONLY): 28 min   Charges:   PT Evaluation $PT Eval Moderate Complexity: 1 Mod   PT General Charges $$ ACUTE PT VISIT: 1 Visit         Leontine Roads, PT, DPT [facility]  Rehabilitation Services Physical Therapist Office: [phone] Website: [email or web address]   Leontine GORMAN Roads 01/12/2024, 2:14 PM

## 2024-01-13 ENCOUNTER — Inpatient Hospital Stay (HOSPITAL_COMMUNITY)

## 2024-01-13 LAB — PREPARE FRESH FROZEN PLASMA
Unit division: 0
Unit division: 0

## 2024-01-13 LAB — BASIC METABOLIC PANEL WITH GFR
Anion gap: 7 (ref 5–15)
BUN: 7 mg/dL (ref 6–20)
CO2: 29 mmol/L (ref 22–32)
Calcium: 8.1 mg/dL — ABNORMAL LOW (ref 8.9–10.3)
Chloride: 99 mmol/L (ref 98–111)
Creatinine, Ser: 0.78 mg/dL (ref 0.61–1.24)
GFR, Estimated: >60 mL/min (ref >60)
Glucose, Bld: 95 mg/dL (ref 70–99)
Potassium: 3.3 mmol/L — ABNORMAL LOW (ref 3.5–5.1)
Sodium: 135 mmol/L (ref 135–145)

## 2024-01-13 LAB — BPAM FFP
Blood Product Expiration Date: 202507012359
Blood Product Expiration Date: 202507012359
Blood Product Expiration Date: 202507022359
Blood Product Expiration Date: 202507022359
Blood Product Expiration Date: 202507022359
Blood Product Expiration Date: 202507022359
Blood Product Expiration Date: 202507022359
Blood Product Expiration Date: 202507022359
Blood Product Expiration Date: 202507022359
Blood Product Expiration Date: 202507022359
ISSUE DATE / TIME: 202506270216
ISSUE DATE / TIME: 202506270216
ISSUE DATE / TIME: 202506270259
ISSUE DATE / TIME: 202506270259
ISSUE DATE / TIME: 202506270259
ISSUE DATE / TIME: 202506270259
ISSUE DATE / TIME: 202506270317
ISSUE DATE / TIME: 202506270317
ISSUE DATE / TIME: 202506291826
ISSUE DATE / TIME: 202506300946
Unit Type and Rh: 5100
Unit Type and Rh: 5100
Unit Type and Rh: 5100
Unit Type and Rh: 5100
Unit Type and Rh: 6200
Unit Type and Rh: 6200
Unit Type and Rh: 6200
Unit Type and Rh: 6200
Unit Type and Rh: 6200
Unit Type and Rh: 6200

## 2024-01-13 LAB — CBC
HCT: 32.2 % — ABNORMAL LOW (ref 39.0–52.0)
Hemoglobin: 10.7 g/dL — ABNORMAL LOW (ref 13.0–17.0)
MCH: 30.1 pg (ref 26.0–34.0)
MCHC: 33.2 g/dL (ref 30.0–36.0)
MCV: 90.7 fL (ref 80.0–100.0)
Platelets: 143 10*3/uL — ABNORMAL LOW (ref 150–400)
RBC: 3.55 MIL/uL — ABNORMAL LOW (ref 4.22–5.81)
RDW: 13.9 % (ref 11.5–15.5)
WBC: 18.3 10*3/uL — ABNORMAL HIGH (ref 4.0–10.5)
nRBC: 0 % (ref 0.0–0.2)

## 2024-01-13 LAB — MAGNESIUM: Magnesium: 1.7 mg/dL (ref 1.7–2.4)

## 2024-01-13 MED ORDER — SODIUM CHLORIDE 0.9% FLUSH
3.0000 mL | Freq: Two times a day (BID) | INTRAVENOUS | Status: DC
  Administered 2024-01-13 (×2): 3 mL via INTRAVENOUS

## 2024-01-13 MED ORDER — FUROSEMIDE 10 MG/ML IJ SOLN
40.0000 mg | Freq: Once | INTRAMUSCULAR | Status: AC
  Administered 2024-01-13: 40 mg via INTRAVENOUS
  Filled 2024-01-13: qty 4

## 2024-01-13 MED ORDER — NICOTINE 7 MG/24HR TD PT24
7.0000 mg | MEDICATED_PATCH | Freq: Every day | TRANSDERMAL | Status: DC
  Administered 2024-01-13: 7 mg via TRANSDERMAL
  Filled 2024-01-13: qty 1

## 2024-01-13 MED ORDER — SODIUM CHLORIDE 0.9 % IV SOLN: 250.0000 mL | INTRAVENOUS | Status: DC | PRN

## 2024-01-13 MED ORDER — METOPROLOL TARTRATE 50 MG PO TABS
50.0000 mg | ORAL_TABLET | Freq: Two times a day (BID) | ORAL | Status: DC
  Administered 2024-01-13 (×2): 50 mg via ORAL
  Filled 2024-01-13 (×2): qty 1

## 2024-01-13 MED ORDER — OXYCODONE HCL 5 MG PO TABS: 5.0000 mg | ORAL_TABLET | ORAL | Status: DC | PRN

## 2024-01-13 MED ORDER — SODIUM CHLORIDE 0.9% FLUSH: 3.0000 mL | INTRAVENOUS | Status: DC | PRN

## 2024-01-13 MED ORDER — MAGNESIUM OXIDE -MG SUPPLEMENT 400 (240 MG) MG PO TABS
400.0000 mg | ORAL_TABLET | Freq: Two times a day (BID) | ORAL | Status: DC
  Administered 2024-01-13 (×2): 400 mg via ORAL
  Filled 2024-01-13 (×2): qty 1

## 2024-01-13 MED ORDER — ~~LOC~~ CARDIAC SURGERY, PATIENT & FAMILY EDUCATION: Freq: Once | Status: AC

## 2024-01-13 MED ORDER — POTASSIUM CHLORIDE CRYS ER 20 MEQ PO TBCR
30.0000 meq | EXTENDED_RELEASE_TABLET | Freq: Three times a day (TID) | ORAL | Status: DC
  Administered 2024-01-13 (×3): 30 meq via ORAL
  Filled 2024-01-13 (×3): qty 1

## 2024-01-13 NOTE — Progress Notes (Signed)
 IP rehab admissions - I met with patient and his mom at the bedside.  Patient does not want to come to inpatient rehab.  Patient wants to DC home with [facility] or outpatient therapy when he is medically ready for DC.  His mom is in agreement with this plan.  [phone]

## 2024-01-13 NOTE — Progress Notes (Addendum)
 4 Days Post-Op Procedure(s) (LRB): REVISION OF AORTIC ROOT GRAFT AND CORONARY ARTERY BYPASS GRAFT TIMES ONE (N/A) Subjective: Mr. Economou is a 43 year-old male is post op day 4 and doing well, per the patient. He is sitting upright in the chair this morning with no complaints. He did have a BM this morning and has been passing gas regularly. He also admits to walking twice around the unit yesterday with no complaints.   Objective: Vital signs in last 24 hours: Temp:  [99 F (37.2 C)] 99 F (37.2 C) (06/30 2000) Pulse Rate:  [81-107] 107 (07/01 0600) Cardiac Rhythm: Normal sinus rhythm (06/30 0800) Resp:  [16-30] 24 (07/01 0600) BP: (86-154)/(55-102) 134/95 (07/01 0600) SpO2:  [89 %-99 %] 93 % (07/01 0600) Weight:  [881 kg] 118 kg (07/01 0500)  Hemodynamic parameters for last 24 hours:    Intake/Output from previous day: 06/30 0701 - 07/01 0700 In: 45.2 [I.V.:45.2] Out: 7015 [Lmpwz:3524; Chest Tube:540] Intake/Output this shift: No intake/output data recorded.  Physical Exam:  General: Well appearing, no acute distress.  Cardiac: Regular rate and rhythm. Sinus tachycardia @ 107. No murmurs, rubs, or gallops. Edema is significantly improving, 1+ pitting edema in extremities. Chest tubes and temporary pacing wires are intact. CXR is improving, stable left sided basilar pleural effusion and cardiomegaly.  Chest: Sternotomy is clean and dry. No signs of infection. R. Subclavian incision is clean and dry with no signs of infection.  Pulmonary: CTA. Poor effort when taking a deep breath. Cough remains moderate. No air leak noted on pleur-vac.  Abdomen: Soft, non-tender.    Lab Results: Recent Labs    01/12/24 0530 01/13/24 0509  WBC 14.1* 18.3*  HGB 9.2* 10.7*  HCT 27.8* 32.2*  PLT 110* 143*   BMET:  Recent Labs    01/12/24 0530 01/13/24 0509  NA 133* 135  K 3.2* 3.3*  CL 98 99  CO2 27 29  GLUCOSE 175* 95  BUN 8 7  CREATININE 0.91 0.78  CALCIUM  7.7* 8.1*     PT/INR: No results for input(s): LABPROT, INR in the last 72 hours. ABG    Component Value Date/Time   PHART 7.482 (H) 01/10/2024 0438   HCO3 29.7 (H) 01/10/2024 0438   TCO2 31 01/10/2024 0438   ACIDBASEDEF 1.0 01/09/2024 0709   O2SAT 95 01/10/2024 0438   CBG (last 3)  Recent Labs    01/11/24 2320 01/12/24 0325 01/12/24 0754  GLUCAP 108* 104* 110*    Assessment/Plan: S/P Procedure(s) (LRB): REVISION OF AORTIC ROOT GRAFT AND CORONARY ARTERY BYPASS GRAFT TIMES ONE (N/A) Assessment and Plan:  Continue pulmonary hygiene as discussed.  Chest output remains stable and minimal. Per Dr. Jeralyn note, plan to d/c chest tubes this AM.  Hypokalemia remains low @ 3.3, considering significant diuresis with IV lasix. Increase PO KCL dose. Creatinine remains stable @ 0.78.  WBCs increased to 18.3 from 14.1.  Continue ambulating around the room and halls.    LOS: 6 days    [name] 01/13/2024  Agree with Above   Overall patient continues to make progress.  He continues to experience some hallucinations overnight.  He ambulated around the unit twice yesterday.  He has also moved his bowels.   Gen: NAD Heart: RRR, tachy Lungs: mildly diminished on left Abd: soft non tender, non-distended Ext; edema significantly improved Incisions: C/D/I Neuro: grossly intact  A/P:  CV- PAF, currently sinus tachycardia, + HTN- will increase Lopressor , continue Lisinopril , Norvasc  Pulm- not requiring oxygen, continued  atelectasis.SABRAsmall left pleural effusion.. however overall appearance of CXR is improving... d/c chest tubes today.SABRA output was minimal yesterday Renal- creatinine remains stable, if accurate diuresed 7L yesterday, edema is significantly improved on exam.. will repeat IV Lasix 40 mg once this morning, transition to oral Lasix tomorrow Hypokalemia- persists due to aggressive diuretic regimen, increase supplementation to 30 meq TID today... monitor ID- leukocytosis-- up to 18K  did receive steroids intraoperatively.. I would suspect this should be wearing off.. no acute evidence on infectious process, however central line to be removed today. If no improvement will need to obtain UA/Blood cultures.SABRA of note patient denies symptoms of dysuria Vaping/Nicotine abuse- per patient request will place low dose Nicotine patch Deconditioning- PT/OT recs CIR.SABRA consult has been placed  Patient making good progress.SABRA Richard response to diuretics yesterday significant improvement in edema... will repeat today, supplement K, d/c chest tubes... transfer to 4E  Rocky Shad, PA-C 01/13/24 7:44 AM  Agree with above Doing well Floor  Kyleigh Nannini O Dainelle Hun

## 2024-01-13 NOTE — Progress Notes (Signed)
 This RN and Jeronimo Reyes,RN to bedside for bedside report. RN sees vape box on patient's bedside table. This RN educated patient on dangers of vaping in hospital and that it is against hospital policy. Vape secured and sent to security. Jeronimo Reyes,RN notified on call MD.

## 2024-01-14 ENCOUNTER — Other Ambulatory Visit: Payer: Self-pay | Admitting: *Deleted

## 2024-01-14 ENCOUNTER — Telehealth: Payer: Self-pay | Admitting: *Deleted

## 2024-01-14 ENCOUNTER — Other Ambulatory Visit: Payer: Self-pay | Admitting: Physician Assistant

## 2024-01-14 MED ORDER — AMLODIPINE BESYLATE 10 MG PO TABS: 10.0000 mg | ORAL_TABLET | Freq: Every day | ORAL | 0 refills | Status: DC

## 2024-01-14 MED ORDER — MAGNESIUM OXIDE 400 MG PO TABS: 400.0000 mg | ORAL_TABLET | Freq: Every day | ORAL | 0 refills | Status: DC

## 2024-01-14 MED ORDER — ROSUVASTATIN CALCIUM 40 MG PO TABS: 40.0000 mg | ORAL_TABLET | Freq: Every day | ORAL | 3 refills | Status: DC

## 2024-01-14 MED ORDER — POTASSIUM CHLORIDE CRYS ER 20 MEQ PO TBCR: 20.0000 meq | EXTENDED_RELEASE_TABLET | Freq: Every day | ORAL | 0 refills | Status: DC

## 2024-01-14 MED ORDER — LISINOPRIL-HYDROCHLOROTHIAZIDE 20-12.5 MG PO TABS: 1.0000 | ORAL_TABLET | Freq: Every day | ORAL | 0 refills | Status: DC

## 2024-01-14 MED ORDER — AMIODARONE HCL 200 MG PO TABS: 400.0000 mg | ORAL_TABLET | Freq: Every day | ORAL | 1 refills | Status: DC

## 2024-01-14 MED ORDER — FUROSEMIDE 40 MG PO TABS: 40.0000 mg | ORAL_TABLET | Freq: Every day | ORAL | 0 refills | Status: DC

## 2024-01-14 MED ORDER — METOPROLOL TARTRATE 50 MG PO TABS: 50.0000 mg | ORAL_TABLET | Freq: Two times a day (BID) | ORAL | 11 refills | Status: DC

## 2024-01-14 NOTE — Progress Notes (Signed)
 Jeremy [name] left the hospital AMA on 01/14/2024.  I have reached out to the patient this morning due to not having his medications.  I explained to the patient the importance of medication compliance in the setting of surgical procedure he underwent. He has his home BP medications.  I explained that he will not be provided pain medications as this is likely the source of his hallucinations he was experiencing.  He was agreeable to take Tylenol .  Finally I have explained that his chest tube sutures will need removed and our office will set up an appointment for next week.   Finally his medications will be called into the [facility] on [address].  Rocky Shad, PA-C 7:33 AM 01/14/24

## 2024-01-14 NOTE — Telephone Encounter (Signed)
 Patient's mother, Rock, contacted the office with concerns over patient's decision to leave the hospital. Mother voiced patient should not have been allowed to leave AMA stating he was hallucinating. Mother states patient arrived home early this morning after taking an [place]. Advised mother that at any point of patient being home that he does not feel well he will need to go to the ED for evaluation. Advised mother to have patient check VS daily and to weigh himself. Advised to keep a log of all recordings. Reviewed appts and medications with mother. Mother states patient is running low on two home meds, Amlodipine  and lisinopril -hydrochlorothiazide. Refill of medications sent in per E. Barrett, PA. Mother verbalized understanding.

## 2024-01-14 NOTE — Progress Notes (Signed)
 Date: 01/14/2024 Patient: Jeremy [name] Admitted: 01/07/2024  2:59 PM Attending Provider: Kerrin Elspeth BROCKS, MD  Jeremy [name] has made the decision for the patient to leave the ICU against the advice of Jeremy [name], Jeremy KIDD, MD.  He has been informed and understands the inherent risks, including death.  He has decided to accept the responsibility for this decision. Jeremy [name] and all necessary parties have been advised that he may return for further evaluation or treatment. His condition at time of discharge was Stable.  Jeremy [name] had current vital signs as follows:  Blood pressure 127/76, pulse 94, temperature 98.1 F (36.7 C), temperature source Oral, resp. rate (!) 24, height 6' 1 (1.854 m), weight 118 kg, SpO2 94%.   Jeremy [name] has signed the Leaving Against Medical Advice form prior to leaving the department.  Jeremy Bonillas M Cruz-Reyes, RN  01/14/2024

## 2024-01-15 MED FILL — Albumin, Human Inj 5%: INTRAVENOUS | Qty: 250 | Status: AC

## 2024-01-15 MED FILL — Sodium Chloride IV Soln 0.9%: INTRAVENOUS | Qty: 5000 | Status: AC

## 2024-01-15 MED FILL — Lidocaine HCl Local Preservative Free (PF) Inj 2%: INTRAMUSCULAR | Qty: 14 | Status: AC

## 2024-01-15 MED FILL — Heparin Sodium (Porcine) Inj 1000 Unit/ML: INTRAMUSCULAR | Qty: 10 | Status: AC

## 2024-01-15 MED FILL — Potassium Chloride Inj 2 mEq/ML: INTRAVENOUS | Qty: 40 | Status: AC

## 2024-01-15 MED FILL — Sodium Chloride IV Soln 0.9%: INTRAVENOUS | Qty: 4000 | Status: AC

## 2024-01-15 MED FILL — Heparin Sodium (Porcine) Inj 1000 Unit/ML: INTRAMUSCULAR | Qty: 30 | Status: AC

## 2024-01-15 MED FILL — Sodium Bicarbonate IV Soln 8.4%: INTRAVENOUS | Qty: 50 | Status: AC

## 2024-01-15 MED FILL — Calcium Chloride Inj 10%: INTRAVENOUS | Qty: 10 | Status: AC

## 2024-01-15 MED FILL — Electrolyte-R (PH 7.4) Solution: INTRAVENOUS | Qty: 7000 | Status: AC

## 2024-01-15 MED FILL — Magnesium Sulfate Inj 50%: INTRAMUSCULAR | Qty: 10 | Status: AC

## 2024-01-15 MED FILL — Heparin Sodium (Porcine) Inj 1000 Unit/ML: INTRAMUSCULAR | Qty: 20 | Status: AC

## 2024-01-15 MED FILL — Heparin Sodium (Porcine) Inj 1000 Unit/ML: INTRAMUSCULAR | Qty: 90 | Status: AC

## 2024-01-15 MED FILL — Electrolyte-R (PH 7.4) Solution: INTRAVENOUS | Qty: 4000 | Status: AC

## 2024-01-15 MED FILL — Sodium Chloride IV Soln 0.9%: INTRAVENOUS | Qty: 1000 | Status: AC

## 2024-01-15 MED FILL — Sodium Bicarbonate IV Soln 8.4%: INTRAVENOUS | Qty: 150 | Status: AC

## 2024-01-15 MED FILL — Sodium Chloride IV Soln 0.9%: INTRAVENOUS | Qty: 6000 | Status: AC

## 2024-01-15 MED FILL — Mannitol IV Soln 20%: INTRAVENOUS | Qty: 500 | Status: AC

## 2024-01-15 MED FILL — Sodium Chloride Irrigation Soln 0.9%: Qty: 3000 | Status: AC

## 2024-01-15 NOTE — Op Note (Signed)
 NAME: Jeremy [name], Jeremy [name] MEDICAL RECORD NO: [id number] ACCOUNT NO: [id number] DATE OF BIRTH: 1981/04/13 FACILITY: [facility] LOCATION: [facility] PHYSICIAN: Elspeth BROCKS. 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  PROCEDURE:  Re-exploration of mediastinum, extracorporeal circulation,  Coronary artery bypass grafting x 1 with saphenous vein graft to the right coronary.  SURGEON: Elspeth BROCKS. Kerrin, MD  ASSISTANT: Laurel Becket, PA.    Experienced assistance was necessary for this case due to surgical complexity.  Laurel Becket assisted with harvesting the saphenous vein from the right thigh and with exposure, retraction of delicate tissues, suture management, and suctioning.  CLINICAL NOTE:  Jeremy [name] is a 43 year old gentleman who had undergone valve-sparing repair of aortic root aneurysm and repair of a type 1 aortic intramural hematoma on 01/08/2024. He has had excessive bleeding in the early postoperative period and was taken back to the operating room later that evening. He then returned to the ICU, but after a couple of hours of relative stability, he had sudden onset of bleeding requiring massive resuscitation and the OR was notified and the patient was transported back to the operating room emergently for re-exploration. The indications, risks, benefits, and alternatives were discussed with the patient's mother via telephone as the patient was intubated and unable to give consent. His mother did give consent.  OPERATIVE NOTE:  Jeremy [name] was transported to the operating room and placed back on the operative table. He was already intubated with appropriate lines in place. Massive transfusion  protocol was ongoing. The chest, abdomen, and legs were prepped and draped in the usual sterile fashion.   A timeout was performed. The median sternotomy was reopened. As that was being done, an incision was made in the right groin and a short piece of the saphenous vein was harvested from the right thigh by Mr. Becket. A sternal retractor was placed and opened. There was clot and fresh blood in the mediastinum. There was no obvious repairable bleeding site. There was bleeding from behind the right coronary button at the proximal anastomosis. The patient was fully heparinized.  After confirming adequate anticoagulation with ACT measurement. The aortic graft was cannulated via concentric 2-0 Ethibond pledgeted sutures. A dual-stage venous cannula was placed via a pursestring suture in the right atrium.  Cardiopulmonary bypass was initiated. Flows were maintained per protocol and the patient was cooled to 34 degrees Celsius.  Antegrade and retrograde cardioplegia cannulas were placed. The aortic graft was cross-clamped. Cardiac arrest was achieved with a combination of cold antegrade and retrograde KBC blood cardioplegia and topical iced saline. After giving the calculated dose and achieving a complete diastolic arrest, the right coronary button was taken down. The annulus was inspected behind that and there was no obvious leaking site. Multiple 4-0 Prolene horizontal mattress sutures with Teflon felt pledgets were placed around the annulus anteriorly. The right coronary button was inspected. It was not sufficiently long to allow for it to be reimplanted to the graft, so the saphenous vein interposition graft was done. Both the right coronary and saphenous vein were large good quality vessels, approximately 5 mm in diameter and an end-to-side anastomosis was performed to the graft and then an end-to-side anastomosis was performed to the right coronary. The right coronary button was oversewn. The patient  was  placed in Trendelenburg position. Deairing was performed. The aortic cross-clamp was removed. The retrograde cardioplegia cannula was removed. The antegrade cardioplegia cannula was left in place for de-airing purposes. Further inspection was made and there appeared to be good hemostasis at all sites. Epicardial pacing wires were already in place on the right ventricle. Right atrial epicardial pacing wires were placed. The patient rewarmed to a core temperature to 37 degrees Celsius. He was weaned from cardiopulmonary bypass on the first attempt.  The test dose of protamine  was administered and was well tolerated. The atrial and aortic cannulas were removed. There was good hemostasis. Transesophageal echocardiography showed no change in left ventricular function. There was very mild aortic insufficiency. Blood products were administered as indicated. The chest was copiously irrigated with saline. The chest tubes were cleared of clot and replaced into their previous positions. The sternum was again closed with a combination of a single and double heavy gauge stainless steel wires. Pectoralis fascia, subcutaneous tissue, and skin were closed in a standard fashion. The sponge, needle, and instrument counts were correct at the end of the procedure. The patient was transported from the  operating room to the surgical intensive care unit, intubated and in critical condition.       PAA D: 01/14/2024 1:38:35 pm T: 01/15/2024 12:57:00 am  JOB: [id number]/ [id number]

## 2024-01-16 ENCOUNTER — Emergency Department (HOSPITAL_BASED_OUTPATIENT_CLINIC_OR_DEPARTMENT_OTHER)

## 2024-01-16 ENCOUNTER — Other Ambulatory Visit: Payer: Self-pay

## 2024-01-16 ENCOUNTER — Inpatient Hospital Stay (HOSPITAL_BASED_OUTPATIENT_CLINIC_OR_DEPARTMENT_OTHER)
Admission: EM | Admit: 2024-01-16 | Discharge: 2024-01-20 | DRG: 314 | Disposition: A | Discharge status: Home / Self Care | Attending: Internal Medicine | Admitting: Internal Medicine

## 2024-01-16 ENCOUNTER — Encounter (HOSPITAL_BASED_OUTPATIENT_CLINIC_OR_DEPARTMENT_OTHER): Payer: Self-pay

## 2024-01-16 DIAGNOSIS — Z09 Encounter for follow-up examination after completed treatment for conditions other than malignant neoplasm: Principal | ICD-10-CM

## 2024-01-16 DIAGNOSIS — F17290 Nicotine dependence, other tobacco product, uncomplicated: Secondary | ICD-10-CM | POA: Diagnosis present

## 2024-01-16 DIAGNOSIS — R252 Cramp and spasm: Secondary | ICD-10-CM | POA: Diagnosis present

## 2024-01-16 DIAGNOSIS — D649 Anemia, unspecified: Secondary | ICD-10-CM | POA: Diagnosis present

## 2024-01-16 DIAGNOSIS — Z8249 Family history of ischemic heart disease and other diseases of the circulatory system: Secondary | ICD-10-CM

## 2024-01-16 DIAGNOSIS — I428 Other cardiomyopathies: Principal | ICD-10-CM | POA: Diagnosis present

## 2024-01-16 DIAGNOSIS — F1410 Cocaine abuse, uncomplicated: Secondary | ICD-10-CM | POA: Diagnosis present

## 2024-01-16 DIAGNOSIS — Z8679 Personal history of other diseases of the circulatory system: Secondary | ICD-10-CM

## 2024-01-16 DIAGNOSIS — I319 Disease of pericardium, unspecified: Secondary | ICD-10-CM | POA: Diagnosis present

## 2024-01-16 DIAGNOSIS — I110 Hypertensive heart disease with heart failure: Secondary | ICD-10-CM | POA: Diagnosis present

## 2024-01-16 DIAGNOSIS — I970 Postcardiotomy syndrome: Principal | ICD-10-CM | POA: Diagnosis present

## 2024-01-16 DIAGNOSIS — R7989 Other specified abnormal findings of blood chemistry: Secondary | ICD-10-CM | POA: Diagnosis present

## 2024-01-16 DIAGNOSIS — R5082 Postprocedural fever: Secondary | ICD-10-CM | POA: Diagnosis present

## 2024-01-16 DIAGNOSIS — Z791 Long term (current) use of non-steroidal anti-inflammatories (NSAID): Secondary | ICD-10-CM

## 2024-01-16 DIAGNOSIS — R000 Tachycardia, unspecified: Secondary | ICD-10-CM | POA: Diagnosis present

## 2024-01-16 DIAGNOSIS — Z95828 Presence of other vascular implants and grafts: Secondary | ICD-10-CM

## 2024-01-16 DIAGNOSIS — L7632 Postprocedural hematoma of skin and subcutaneous tissue following other procedure: Secondary | ICD-10-CM | POA: Diagnosis present

## 2024-01-16 DIAGNOSIS — Y838 Other surgical procedures as the cause of abnormal reaction of the patient, or of later complication, without mention of misadventure at the time of the procedure: Secondary | ICD-10-CM | POA: Diagnosis present

## 2024-01-16 DIAGNOSIS — D72829 Elevated white blood cell count, unspecified: Secondary | ICD-10-CM | POA: Diagnosis present

## 2024-01-16 DIAGNOSIS — A419 Sepsis, unspecified organism: Secondary | ICD-10-CM | POA: Diagnosis present

## 2024-01-16 DIAGNOSIS — R509 Fever, unspecified: Secondary | ICD-10-CM | POA: Insufficient documentation

## 2024-01-16 DIAGNOSIS — Z880 Allergy status to penicillin: Secondary | ICD-10-CM

## 2024-01-16 DIAGNOSIS — Z951 Presence of aortocoronary bypass graft: Secondary | ICD-10-CM

## 2024-01-16 DIAGNOSIS — I808 Phlebitis and thrombophlebitis of other sites: Secondary | ICD-10-CM | POA: Diagnosis present

## 2024-01-16 DIAGNOSIS — I5022 Chronic systolic (congestive) heart failure: Secondary | ICD-10-CM | POA: Diagnosis present

## 2024-01-16 DIAGNOSIS — Z79899 Other long term (current) drug therapy: Secondary | ICD-10-CM

## 2024-01-16 LAB — URINALYSIS, ROUTINE W REFLEX MICROSCOPIC
Bilirubin Urine: NEGATIVE
Glucose, UA: NEGATIVE mg/dL
Hgb urine dipstick: NEGATIVE
Ketones, ur: NEGATIVE mg/dL
Leukocytes,Ua: NEGATIVE
Nitrite: NEGATIVE
Protein, ur: NEGATIVE mg/dL
Specific Gravity, Urine: 1.009 (ref 1.005–1.030)
pH: 8 (ref 5.0–8.0)

## 2024-01-16 LAB — CBC WITH DIFFERENTIAL/PLATELET
Abs Immature Granulocytes: 0.2 K/uL — ABNORMAL HIGH (ref 0.00–0.07)
Basophils Absolute: 0 K/uL (ref 0.0–0.1)
Basophils Relative: 0 %
Eosinophils Absolute: 0.1 K/uL (ref 0.0–0.5)
Eosinophils Relative: 1 %
HCT: 31.4 % — ABNORMAL LOW (ref 39.0–52.0)
Hemoglobin: 10.5 g/dL — ABNORMAL LOW (ref 13.0–17.0)
Immature Granulocytes: 1 %
Lymphocytes Relative: 15 %
Lymphs Abs: 2.7 K/uL (ref 0.7–4.0)
MCH: 29.4 pg (ref 26.0–34.0)
MCHC: 33.4 g/dL (ref 30.0–36.0)
MCV: 88 fL (ref 80.0–100.0)
Monocytes Absolute: 1.7 K/uL — ABNORMAL HIGH (ref 0.1–1.0)
Monocytes Relative: 9 %
Neutro Abs: 14 K/uL — ABNORMAL HIGH (ref 1.7–7.7)
Neutrophils Relative %: 74 %
Platelets: 363 K/uL (ref 150–400)
RBC: 3.57 MIL/uL — ABNORMAL LOW (ref 4.22–5.81)
RDW: 14 % (ref 11.5–15.5)
WBC: 18.9 K/uL — ABNORMAL HIGH (ref 4.0–10.5)
nRBC: 0 % (ref 0.0–0.2)

## 2024-01-16 LAB — COMPREHENSIVE METABOLIC PANEL WITH GFR
ALT: 42 U/L (ref 0–44)
AST: 99 U/L — ABNORMAL HIGH (ref 15–41)
Albumin: 3.5 g/dL (ref 3.5–5.0)
Alkaline Phosphatase: 75 U/L (ref 38–126)
Anion gap: 10 (ref 5–15)
BUN: 9 mg/dL (ref 6–20)
CO2: 28 mmol/L (ref 22–32)
Calcium: 8.7 mg/dL — ABNORMAL LOW (ref 8.9–10.3)
Chloride: 98 mmol/L (ref 98–111)
Creatinine, Ser: 0.84 mg/dL (ref 0.61–1.24)
GFR, Estimated: >60 mL/min (ref >60)
Glucose, Bld: 92 mg/dL (ref 70–99)
Potassium: 3.4 mmol/L — ABNORMAL LOW (ref 3.5–5.1)
Sodium: 136 mmol/L (ref 135–145)
Total Bilirubin: 0.9 mg/dL (ref 0.0–1.2)
Total Protein: 7.3 g/dL (ref 6.5–8.1)

## 2024-01-16 LAB — TROPONIN T, HIGH SENSITIVITY
Troponin T High Sensitivity: 2650 ng/L (ref <19)
Troponin T High Sensitivity: 2262 ng/L (ref <19)

## 2024-01-16 LAB — LACTIC ACID, PLASMA
Lactic Acid, Venous: 0.7 mmol/L (ref 0.5–1.9)
Lactic Acid, Venous: 0.8 mmol/L (ref 0.5–1.9)

## 2024-01-16 MED ORDER — VANCOMYCIN HCL IN DEXTROSE 1-5 GM/200ML-% IV SOLN
1000.0000 mg | Freq: Once | INTRAVENOUS | Status: AC
  Administered 2024-01-17: 1000 mg via INTRAVENOUS
  Filled 2024-01-16: qty 200

## 2024-01-16 MED ORDER — METRONIDAZOLE 500 MG/100ML IV SOLN
500.0000 mg | Freq: Once | INTRAVENOUS | Status: AC
  Administered 2024-01-16: 500 mg via INTRAVENOUS
  Filled 2024-01-16: qty 100

## 2024-01-16 MED ORDER — SODIUM CHLORIDE 0.9 % IV SOLN
2.0000 g | Freq: Three times a day (TID) | INTRAVENOUS | Status: DC
  Administered 2024-01-16 – 2024-01-19 (×8): 2 g via INTRAVENOUS
  Filled 2024-01-16 (×7): qty 10

## 2024-01-16 MED ORDER — ACETAMINOPHEN 500 MG PO TABS
1000.0000 mg | ORAL_TABLET | Freq: Once | ORAL | Status: AC
  Administered 2024-01-16: 1000 mg via ORAL
  Filled 2024-01-16: qty 2

## 2024-01-16 MED ORDER — VANCOMYCIN HCL IN DEXTROSE 1-5 GM/200ML-% IV SOLN
1000.0000 mg | Freq: Once | INTRAVENOUS | Status: AC
  Administered 2024-01-16: 1000 mg via INTRAVENOUS
  Filled 2024-01-16: qty 200

## 2024-01-16 MED ORDER — VANCOMYCIN HCL IN DEXTROSE 1-5 GM/200ML-% IV SOLN: 1000.0000 mg | Freq: Once | INTRAVENOUS | Status: DC

## 2024-01-16 NOTE — ED Triage Notes (Incomplete)
 Pt reports having open heart surgery x1 week ago. Pt concerned about infection at R femoral site. Pt reports he missed his follow up appointments to have sutures removed and has been having some medication complications and psych issues.

## 2024-01-16 NOTE — ED Notes (Signed)
 Report called over to  6E and given to [facility]

## 2024-01-16 NOTE — Plan of Care (Signed)
 Plan of Care Note for accepted transfer   Patient name: Jeremy [name] FMW:[id number] DOB: Jun 18, 1981  Facility requesting transfer: [facility] Requesting Provider: Dr. Geraldene Facility course: 43 year old male with past medical history of hypertension, NICM, cocaine abuse, known ascending aneurysm recently admitted 6/25 for chest pain and shortness of breath.  CT showed aortic intramural hematoma in the distal ascending and proximal arch and very limited dissection in the common iliac.  He was taken to the OR on 6/26 for valve sparing aortic root aneurysm repair.  He had massive blood loss and was coagulopathic.  He was transferred to surgical ICU and was resuscitated with blood products and required reexploration of the mediastinum with improvement of hemodynamics.  He again later developed bleeding and was taken back to the OR and underwent revision of the aortic root graft and CABG x 1.  Patient left AMA on 7/2.  He presents to the ED today due to concern for infection at right groin surgical site.  In the ED, his wounds did not appear grossly infected and lower extremity pulses intact.  However, patient noted to be febrile and slightly tachycardic.  Not hypotensive.  Labs notable for WBC count 18.9, hemoglobin 10.5 (stable), potassium 3.4, creatinine 0.8, AST 99 and remainder of LFTs normal, lactic acid normal x 2, troponin 2650> 2262.  EKG showing new ST depressions and T wave inversions inferolaterally.  Chest x-ray showing cardiomegaly with small bilateral pleural effusions and improved aeration of the left lung base compared to prior.  ED physician discussed the case with cardiothoracic surgery and cardiology, both services will consult.  Blood cultures drawn and patient will be given broad-spectrum antibiotics.  Plan of care: The patient is accepted for admission to Progressive unit at [facility]  [facility] will assume care on arrival to accepting facility. Until arrival, care as  per EDP. However, [facility] available 24/7 for questions and assistance.  Check [email or web address] for on-call coverage.  Nursing staff, please call [facility] Admits & Consults System-Wide number under Amion on patient's arrival so appropriate admitting provider can evaluate the pt.

## 2024-01-16 NOTE — ED Provider Notes (Addendum)
 [facility] EMERGENCY DEPARTMENT AT [facility] Provider Note   CSN: [id number] Arrival date & time: 01/16/24  1659     Patient presents with: Post-op Problem   Jeremy [name] is a 43 y.o. male.   Patient here status post cardiothoracic surgery he was admitted on June 25 discharged on July 2.  Patient had aortic thrombus history of polysubstance abuse pressure injury of skin status post ascending aortic replacement intramural hematoma thoracic aorta history of tobacco abuse hypertensive heart disease without heart failure.  And nonischemic cardiomyopathy.  Patient states that he is having fevers.  Has increased heart rate.  No nausea no vomiting no real shortness of breath.  Worried about infection in his right groin wound.  He has follow-up with cardiothoracic surgery this week.  Still has staples in the groin still has some sutures in the chest area.       Prior to Admission medications   Medication Sig Start Date End Date Taking? Authorizing Provider  amiodarone  (PACERONE ) 200 MG tablet Take 2 tablets (400 mg total) by mouth daily. X 5 days, then decrease 200 mg (1 tab) BID x 7 days, then decrease to 200 mg 01/14/24   Barrett, Erin R, PA-C  amLODipine  (NORVASC ) 10 MG tablet Take 1 tablet (10 mg total) by mouth daily. 01/14/24   Kerrin Elspeth BROCKS, MD  furosemide  (LASIX ) 40 MG tablet Take 1 tablet (40 mg total) by mouth daily. 01/14/24 01/13/25  Barrett, Erin R, PA-C  indomethacin (INDOCIN) 25 MG capsule Take 25 mg by mouth 2 (two) times daily.    [provider]  lisinopril -hydrochlorothiazide  (ZESTORETIC ) 20-12.5 MG tablet Take 1 tablet by mouth daily. 01/14/24   Kerrin Elspeth BROCKS, MD  magnesium  oxide (MAG-OX) 400 MG tablet Take 1 tablet (400 mg total) by mouth daily. 01/14/24   Barrett, Rocky SAUNDERS, PA-C  metoprolol  tartrate (LOPRESSOR ) 50 MG tablet Take 1 tablet (50 mg total) by mouth 2 (two) times daily. 01/14/24 01/13/25  Barrett, Erin R, PA-C  potassium chloride  SA  (KLOR-CON  M) 20 MEQ tablet Take 1 tablet (20 mEq total) by mouth daily. 01/14/24   Barrett, Erin R, PA-C  rosuvastatin  (CRESTOR ) 40 MG tablet Take 1 tablet (40 mg total) by mouth daily. 01/14/24   Barrett, Erin R, PA-C    Allergies: Penicillins and Principen [ampicillin]    Review of Systems  Constitutional:  Positive for fever. Negative for chills.  HENT:  Negative for ear pain and sore throat.   Eyes:  Negative for pain and visual disturbance.  Respiratory:  Negative for cough and shortness of breath.   Cardiovascular:  Positive for palpitations. Negative for chest pain.  Gastrointestinal:  Negative for abdominal pain and vomiting.  Genitourinary:  Negative for dysuria and hematuria.  Musculoskeletal:  Negative for arthralgias and back pain.  Skin:  Negative for color change and rash.  Neurological:  Negative for seizures and syncope.  All other systems reviewed and are negative.   Updated Vital Signs BP (!) 120/93   Pulse (!) 104   Temp 99.6 F (37.6 C) (Oral)   Resp 18   Ht 1.829 m (6')   Wt 117.9 kg   SpO2 94%   BMI 35.26 kg/m   Physical Exam Vitals and nursing note reviewed.  Constitutional:      General: He is not in acute distress.    Appearance: Normal appearance. He is well-developed. He is not ill-appearing.  HENT:     Head: Normocephalic and atraumatic.  Mouth/Throat:     Mouth: Mucous membranes are moist.  Eyes:     Conjunctiva/sclera: Conjunctivae normal.     Pupils: Pupils are equal, round, and reactive to light.  Cardiovascular:     Rate and Rhythm: Regular rhythm. Tachycardia present.     Heart sounds: No murmur heard. Pulmonary:     Effort: Pulmonary effort is normal. No respiratory distress.     Breath sounds: Normal breath sounds.     Comments: Chest wounds all appear to be healing well.  No signs of any secondary infection. Chest:     Chest wall: No tenderness.  Abdominal:     General: There is no distension.     Palpations: Abdomen is soft.      Tenderness: There is no abdominal tenderness. There is no guarding.  Musculoskeletal:        General: No swelling.     Cervical back: Neck supple.     Comments: Right groin wound appears to be healing well no signs of any secondary infection.  Nontender no induration.  No discharge.  Distally right dorsalis pedis pulse is 2+.  Good cap refill.  No swelling to the ankles.  No swelling to the left ankle.  Salas pedis pulse left foot is 2+.  Skin:    General: Skin is warm and dry.     Capillary Refill: Capillary refill takes less than 2 seconds.  Neurological:     General: No focal deficit present.     Mental Status: He is alert and oriented to person, place, and time.  Psychiatric:        Mood and Affect: Mood normal.     (all labs ordered are listed, but only abnormal results are displayed) Labs Reviewed  CBC WITH DIFFERENTIAL/PLATELET - Abnormal; Notable for the following components:      Result Value   WBC 18.9 (*)    RBC 3.57 (*)    Hemoglobin 10.5 (*)    HCT 31.4 (*)    Neutro Abs 14.0 (*)    Monocytes Absolute 1.7 (*)    Abs Immature Granulocytes 0.20 (*)    All other components within normal limits  COMPREHENSIVE METABOLIC PANEL WITH GFR - Abnormal; Notable for the following components:   Potassium 3.4 (*)    Calcium  8.7 (*)    AST 99 (*)    All other components within normal limits  TROPONIN T, HIGH SENSITIVITY - Abnormal; Notable for the following components:   Troponin T High Sensitivity 2,650 (*)    All other components within normal limits  CULTURE, BLOOD (ROUTINE X 2)  CULTURE, BLOOD (ROUTINE X 2)  URINALYSIS, ROUTINE W REFLEX MICROSCOPIC  LACTIC ACID, PLASMA  LACTIC ACID, PLASMA  TROPONIN T, HIGH SENSITIVITY    EKG: None  Radiology: [facility] Chest Port 1 View Result Date: 01/16/2024 CLINICAL DATA:  Postop fever EXAM: PORTABLE CHEST 1 VIEW COMPARISON:  01/13/2024 FINDINGS: Post sternotomy changes. Interim removal of mediastinal drainage catheter and right IJ  catheter sheath. Cardiomegaly with small pleural effusions. Improved aeration of the left lung base compared to prior. No visible pneumothorax IMPRESSION: Cardiomegaly with small pleural effusions. Improved aeration of the left lung base compared to prior. Electronically Signed   By: Luke Bun M.D.   On: 01/16/2024 18:49     Procedures   Medications Ordered in the ED - No data to display  Medical Decision Making Amount and/or Complexity of Data Reviewed Labs: ordered. Radiology: ordered.  Risk Decision regarding hospitalization.   Patient's wounds do not appear to have any secondary infection.  But some concern the history of fever.  Temp here 99.6 pulse 117 respirations 18 blood pressure 117/99.  Will get blood cultures.  Will get lactic acid will get CBC with differential get complete metabolic panel.  Will get chest x-ray and will check urine.  Patient's white count is 18.9 was 18.1 when he left the hospital on July 1.  Hemoglobin 10.5 platelets 363 urinalysis negative for urinary tract infection.  Complete metabolic panel potassium down just a little bit otherwise normal renal functions normal.  Anion gap normal.  Lactic acid x 2 is less than 1.  Patient's initial troponin 2650 could be postoperative will discuss with cardiology on-call and probably will need to maybe consult cardiothoracic surgery.  Regular chest x-ray cardiomegaly with small pleural effusions improve aeration of the left lung base compared to prior.  Patient without any fevers here but did talk about fevers at home.  Will discuss with cardiology and then may discuss with cardiothoracic surgery.  Will hold off on any heparin  at this time because of the recent surgery.    Final diagnoses:  Postop check  Elevated troponin    ED Discharge Orders     None          Geraldene Hamilton, MD 01/16/24 1736    Geraldene Hamilton, MD 01/16/24 1950

## 2024-01-16 NOTE — ED Notes (Signed)
 PT mother is with him, pt states he drove here and mother is blind, ask if mother can ride with [facility] to the admitting hospital.

## 2024-01-17 ENCOUNTER — Inpatient Hospital Stay (HOSPITAL_COMMUNITY)

## 2024-01-17 ENCOUNTER — Other Ambulatory Visit: Payer: Self-pay

## 2024-01-17 ENCOUNTER — Telehealth (HOSPITAL_COMMUNITY): Payer: Self-pay | Admitting: *Deleted

## 2024-01-17 ENCOUNTER — Encounter (HOSPITAL_COMMUNITY): Payer: Self-pay | Admitting: Internal Medicine

## 2024-01-17 DIAGNOSIS — A419 Sepsis, unspecified organism: Secondary | ICD-10-CM | POA: Diagnosis present

## 2024-01-17 DIAGNOSIS — I5020 Unspecified systolic (congestive) heart failure: Secondary | ICD-10-CM | POA: Diagnosis not present

## 2024-01-17 DIAGNOSIS — L7632 Postprocedural hematoma of skin and subcutaneous tissue following other procedure: Secondary | ICD-10-CM | POA: Diagnosis present

## 2024-01-17 DIAGNOSIS — R509 Fever, unspecified: Secondary | ICD-10-CM | POA: Insufficient documentation

## 2024-01-17 DIAGNOSIS — R5082 Postprocedural fever: Secondary | ICD-10-CM | POA: Diagnosis present

## 2024-01-17 DIAGNOSIS — Z79899 Other long term (current) drug therapy: Secondary | ICD-10-CM | POA: Diagnosis not present

## 2024-01-17 DIAGNOSIS — Y838 Other surgical procedures as the cause of abnormal reaction of the patient, or of later complication, without mention of misadventure at the time of the procedure: Secondary | ICD-10-CM | POA: Diagnosis present

## 2024-01-17 DIAGNOSIS — I808 Phlebitis and thrombophlebitis of other sites: Secondary | ICD-10-CM | POA: Diagnosis present

## 2024-01-17 DIAGNOSIS — Z8679 Personal history of other diseases of the circulatory system: Secondary | ICD-10-CM | POA: Diagnosis not present

## 2024-01-17 DIAGNOSIS — M79651 Pain in right thigh: Secondary | ICD-10-CM | POA: Diagnosis not present

## 2024-01-17 DIAGNOSIS — Z791 Long term (current) use of non-steroidal anti-inflammatories (NSAID): Secondary | ICD-10-CM | POA: Diagnosis not present

## 2024-01-17 DIAGNOSIS — R7989 Other specified abnormal findings of blood chemistry: Secondary | ICD-10-CM | POA: Diagnosis not present

## 2024-01-17 DIAGNOSIS — I429 Cardiomyopathy, unspecified: Secondary | ICD-10-CM

## 2024-01-17 DIAGNOSIS — M7989 Other specified soft tissue disorders: Secondary | ICD-10-CM | POA: Diagnosis not present

## 2024-01-17 DIAGNOSIS — I712 Thoracic aortic aneurysm, without rupture, unspecified: Secondary | ICD-10-CM | POA: Diagnosis not present

## 2024-01-17 DIAGNOSIS — Z951 Presence of aortocoronary bypass graft: Secondary | ICD-10-CM | POA: Diagnosis not present

## 2024-01-17 DIAGNOSIS — I428 Other cardiomyopathies: Secondary | ICD-10-CM | POA: Diagnosis present

## 2024-01-17 DIAGNOSIS — R222 Localized swelling, mass and lump, trunk: Secondary | ICD-10-CM | POA: Diagnosis present

## 2024-01-17 DIAGNOSIS — Z8249 Family history of ischemic heart disease and other diseases of the circulatory system: Secondary | ICD-10-CM | POA: Diagnosis not present

## 2024-01-17 DIAGNOSIS — I110 Hypertensive heart disease with heart failure: Secondary | ICD-10-CM | POA: Diagnosis present

## 2024-01-17 DIAGNOSIS — I970 Postcardiotomy syndrome: Secondary | ICD-10-CM | POA: Diagnosis present

## 2024-01-17 DIAGNOSIS — R079 Chest pain, unspecified: Secondary | ICD-10-CM

## 2024-01-17 DIAGNOSIS — I10 Essential (primary) hypertension: Secondary | ICD-10-CM

## 2024-01-17 DIAGNOSIS — D649 Anemia, unspecified: Secondary | ICD-10-CM | POA: Diagnosis present

## 2024-01-17 DIAGNOSIS — Z880 Allergy status to penicillin: Secondary | ICD-10-CM | POA: Diagnosis not present

## 2024-01-17 DIAGNOSIS — I5022 Chronic systolic (congestive) heart failure: Secondary | ICD-10-CM | POA: Diagnosis present

## 2024-01-17 DIAGNOSIS — D72829 Elevated white blood cell count, unspecified: Secondary | ICD-10-CM | POA: Diagnosis present

## 2024-01-17 DIAGNOSIS — R252 Cramp and spasm: Secondary | ICD-10-CM | POA: Diagnosis present

## 2024-01-17 DIAGNOSIS — R000 Tachycardia, unspecified: Secondary | ICD-10-CM | POA: Diagnosis present

## 2024-01-17 DIAGNOSIS — F17290 Nicotine dependence, other tobacco product, uncomplicated: Secondary | ICD-10-CM | POA: Diagnosis present

## 2024-01-17 DIAGNOSIS — F1410 Cocaine abuse, uncomplicated: Secondary | ICD-10-CM | POA: Diagnosis present

## 2024-01-17 LAB — COMPREHENSIVE METABOLIC PANEL WITH GFR
ALT: 32 U/L (ref 0–44)
AST: 52 U/L — ABNORMAL HIGH (ref 15–41)
Albumin: 2.7 g/dL — ABNORMAL LOW (ref 3.5–5.0)
Alkaline Phosphatase: 50 U/L (ref 38–126)
Anion gap: 11 (ref 5–15)
BUN: 8 mg/dL (ref 6–20)
CO2: 24 mmol/L (ref 22–32)
Calcium: 8 mg/dL — ABNORMAL LOW (ref 8.9–10.3)
Chloride: 100 mmol/L (ref 98–111)
Creatinine, Ser: 0.85 mg/dL (ref 0.61–1.24)
GFR, Estimated: >60 mL/min (ref >60)
Glucose, Bld: 113 mg/dL — ABNORMAL HIGH (ref 70–99)
Potassium: 3.2 mmol/L — ABNORMAL LOW (ref 3.5–5.1)
Sodium: 135 mmol/L (ref 135–145)
Total Bilirubin: 1.2 mg/dL (ref 0.0–1.2)
Total Protein: 6.6 g/dL (ref 6.5–8.1)

## 2024-01-17 LAB — CBC WITH DIFFERENTIAL/PLATELET
Abs Immature Granulocytes: 0.15 K/uL — ABNORMAL HIGH (ref 0.00–0.07)
Basophils Absolute: 0.1 K/uL (ref 0.0–0.1)
Basophils Relative: 0 %
Eosinophils Absolute: 0.2 K/uL (ref 0.0–0.5)
Eosinophils Relative: 1 %
HCT: 30.3 % — ABNORMAL LOW (ref 39.0–52.0)
Hemoglobin: 10.3 g/dL — ABNORMAL LOW (ref 13.0–17.0)
Immature Granulocytes: 1 %
Lymphocytes Relative: 13 %
Lymphs Abs: 2.5 K/uL (ref 0.7–4.0)
MCH: 30 pg (ref 26.0–34.0)
MCHC: 34 g/dL (ref 30.0–36.0)
MCV: 88.3 fL (ref 80.0–100.0)
Monocytes Absolute: 1.3 K/uL — ABNORMAL HIGH (ref 0.1–1.0)
Monocytes Relative: 7 %
Neutro Abs: 14.8 K/uL — ABNORMAL HIGH (ref 1.7–7.7)
Neutrophils Relative %: 78 %
Platelets: 353 K/uL (ref 150–400)
RBC: 3.43 MIL/uL — ABNORMAL LOW (ref 4.22–5.81)
RDW: 14.1 % (ref 11.5–15.5)
WBC: 19 K/uL — ABNORMAL HIGH (ref 4.0–10.5)
nRBC: 0 % (ref 0.0–0.2)

## 2024-01-17 LAB — ECHOCARDIOGRAM COMPLETE
AR max vel: 1.38 cm2
AV Area VTI: 1.36 cm2
AV Area mean vel: 1.39 cm2
AV Mean grad: 14.5 mmHg
AV Peak grad: 27.6 mmHg
Ao pk vel: 2.63 m/s
Area-P 1/2: 4.96 cm2
Height: 72 in
S' Lateral: 4.7 cm
Weight: 3742.53 [oz_av]

## 2024-01-17 LAB — HIV ANTIBODY (ROUTINE TESTING W REFLEX): HIV Screen 4th Generation wRfx: NONREACTIVE

## 2024-01-17 MED ORDER — AMIODARONE HCL 200 MG PO TABS: 200.0000 mg | ORAL_TABLET | Freq: Two times a day (BID) | ORAL | Status: DC

## 2024-01-17 MED ORDER — MAGNESIUM OXIDE -MG SUPPLEMENT 400 (240 MG) MG PO TABS
400.0000 mg | ORAL_TABLET | Freq: Every day | ORAL | Status: DC
  Administered 2024-01-18 – 2024-01-20 (×3): 400 mg via ORAL
  Filled 2024-01-17 (×3): qty 1

## 2024-01-17 MED ORDER — MAGNESIUM OXIDE 400 MG PO TABS
400.0000 mg | ORAL_TABLET | Freq: Every day | ORAL | Status: DC
  Administered 2024-01-17: 400 mg via ORAL
  Filled 2024-01-17 (×2): qty 1

## 2024-01-17 MED ORDER — LISINOPRIL 20 MG PO TABS
20.0000 mg | ORAL_TABLET | Freq: Every day | ORAL | Status: DC
  Administered 2024-01-17: 20 mg via ORAL
  Filled 2024-01-17: qty 1

## 2024-01-17 MED ORDER — AMIODARONE HCL 200 MG PO TABS
200.0000 mg | ORAL_TABLET | Freq: Two times a day (BID) | ORAL | Status: DC
  Administered 2024-01-18 – 2024-01-20 (×5): 200 mg via ORAL
  Filled 2024-01-17 (×5): qty 1

## 2024-01-17 MED ORDER — VANCOMYCIN HCL IN DEXTROSE 1-5 GM/200ML-% IV SOLN
1000.0000 mg | Freq: Three times a day (TID) | INTRAVENOUS | Status: DC
  Administered 2024-01-17: 1000 mg via INTRAVENOUS
  Filled 2024-01-17 (×5): qty 200

## 2024-01-17 MED ORDER — ACETAMINOPHEN 650 MG RE SUPP: 650.0000 mg | Freq: Four times a day (QID) | RECTAL | Status: DC | PRN

## 2024-01-17 MED ORDER — METOPROLOL TARTRATE 50 MG PO TABS
50.0000 mg | ORAL_TABLET | Freq: Two times a day (BID) | ORAL | Status: DC
  Administered 2024-01-17: 50 mg via ORAL
  Filled 2024-01-17: qty 1

## 2024-01-17 MED ORDER — POTASSIUM CHLORIDE CRYS ER 20 MEQ PO TBCR
20.0000 meq | EXTENDED_RELEASE_TABLET | Freq: Every day | ORAL | Status: DC
  Administered 2024-01-17 – 2024-01-19 (×3): 20 meq via ORAL
  Filled 2024-01-17 (×3): qty 1

## 2024-01-17 MED ORDER — AMIODARONE HCL 200 MG PO TABS
400.0000 mg | ORAL_TABLET | Freq: Every day | ORAL | Status: DC
  Administered 2024-01-17: 400 mg via ORAL
  Filled 2024-01-17: qty 2

## 2024-01-17 MED ORDER — METOPROLOL SUCCINATE ER 50 MG PO TB24
50.0000 mg | ORAL_TABLET | Freq: Every day | ORAL | Status: DC
  Administered 2024-01-18 – 2024-01-20 (×3): 50 mg via ORAL
  Filled 2024-01-17 (×3): qty 1

## 2024-01-17 MED ORDER — AMIODARONE HCL 200 MG PO TABS: 200.0000 mg | ORAL_TABLET | Freq: Every day | ORAL | Status: DC

## 2024-01-17 MED ORDER — ORAL CARE MOUTH RINSE: 15.0000 mL | OROMUCOSAL | Status: DC | PRN

## 2024-01-17 MED ORDER — DAPAGLIFLOZIN PROPANEDIOL 10 MG PO TABS
10.0000 mg | ORAL_TABLET | Freq: Every day | ORAL | Status: DC
  Administered 2024-01-18 – 2024-01-20 (×3): 10 mg via ORAL
  Filled 2024-01-17 (×3): qty 1

## 2024-01-17 MED ORDER — ROSUVASTATIN CALCIUM 20 MG PO TABS
40.0000 mg | ORAL_TABLET | Freq: Every day | ORAL | Status: DC
  Administered 2024-01-17 – 2024-01-20 (×4): 40 mg via ORAL
  Filled 2024-01-17 (×4): qty 2

## 2024-01-17 MED ORDER — ACETAMINOPHEN 325 MG PO TABS
650.0000 mg | ORAL_TABLET | Freq: Four times a day (QID) | ORAL | Status: DC | PRN
  Administered 2024-01-17 – 2024-01-19 (×2): 650 mg via ORAL
  Filled 2024-01-17 (×2): qty 2

## 2024-01-17 MED ORDER — FUROSEMIDE 40 MG PO TABS
40.0000 mg | ORAL_TABLET | Freq: Every day | ORAL | Status: DC
  Administered 2024-01-17 – 2024-01-20 (×4): 40 mg via ORAL
  Filled 2024-01-17 (×4): qty 1

## 2024-01-17 MED ORDER — HYDROCHLOROTHIAZIDE 12.5 MG PO TABS
12.5000 mg | ORAL_TABLET | Freq: Every day | ORAL | Status: DC
  Administered 2024-01-17 – 2024-01-19 (×3): 12.5 mg via ORAL
  Filled 2024-01-17 (×3): qty 1

## 2024-01-17 MED ORDER — VANCOMYCIN HCL IN DEXTROSE 1-5 GM/200ML-% IV SOLN
1000.0000 mg | Freq: Three times a day (TID) | INTRAVENOUS | Status: DC
  Administered 2024-01-17 – 2024-01-20 (×8): 1000 mg via INTRAVENOUS
  Filled 2024-01-17 (×10): qty 200

## 2024-01-17 MED ORDER — LISINOPRIL-HYDROCHLOROTHIAZIDE 20-12.5 MG PO TABS: 1.0000 | ORAL_TABLET | Freq: Every day | ORAL | Status: DC

## 2024-01-17 MED ORDER — AMIODARONE HCL 200 MG PO TABS: 400.0000 mg | ORAL_TABLET | Freq: Every day | ORAL | Status: DC

## 2024-01-17 MED ORDER — AMLODIPINE BESYLATE 10 MG PO TABS
10.0000 mg | ORAL_TABLET | Freq: Every day | ORAL | Status: DC
  Administered 2024-01-17: 10 mg via ORAL
  Filled 2024-01-17: qty 1

## 2024-01-17 NOTE — ED Notes (Signed)
 Pt and pt mother were made aware that the pt mother is unable to stay with pt overnight

## 2024-01-17 NOTE — Progress Notes (Signed)
 Pharmacy Antibiotic Note  Jeremy [name] is a 43 y.o. male admitted on 01/16/2024 with possible post-op infection.  Pharmacy has been consulted for vancomycin  dosing.  Plan: Vancomycin  1000 mg IV Q 8 hrs. Goal AUC 400-550.  Expected AUC 530.  Height: 6' (182.9 cm) Weight: 106.1 kg (233 lb 14.5 oz) IBW/kg (Calculated) : 77.6  Temp (24hrs), Avg:99.6 F (37.6 C), Min:98.8 F (37.1 C), Max:101.2 F (38.4 C)  Recent Labs  Lab 01/11/24 0432 01/12/24 0530 01/13/24 0509 01/16/24 1734 01/16/24 1810  WBC 14.5* 14.1* 18.3*  --  18.9*  CREATININE 1.02 0.91 0.78  --  0.84  LATICACIDVEN  --   --   --  0.7 0.8    Estimated Creatinine Clearance: 141.3 mL/min (by C-G formula based on SCr of 0.84 mg/dL).    Allergies  Allergen Reactions   Penicillins Hives   Principen [Ampicillin] Hives   Thank you for allowing pharmacy to be a part of this patient's care.  Marvetta Dauphin, PharmD, BCPS  01/17/2024 7:07 AM

## 2024-01-17 NOTE — H&P (Addendum)
 History and Physical    LITTLE WINTON FMW:[id number] DOB: 21-Oct-1980 DOA: 01/16/2024  Patient coming from: Home.  Chief Complaint: Right groin swelling.  HPI: DAILY CRATE is a 43 y.o. male with history of hypertension, nonischemic cardiomyopathy, cocaine abuse with known ascending aortic aneurysm CT scan done in 2022 was admitted to the hospital on 01/08/2024 when patient underwent valve sparing aortic root aneurysm repair under hypothermic circulatory arrest.  Patient had massive blood loss and was coagulopathic.  He was transferred to SICU and was resuscitated with blood products but required reexploration of the mediastinum with improvement of hemodynamics and he was transferred back to SICU.  He again developed bleeding and was taken back to the OR underwent revision of the aortic root graft and CABG x 1 utilizing SVG to RCA with open harvest of the right great saphenous vein.  He was transferred back to ICU under stable condition.  He required vasopressors.  Gradually his medications were changed to oral with patient being on Norvasc  lisinopril  hydrochlorothiazide  beta-blockers and amiodarone .  Patient left AMA and presents back to the ER 2 days later with complaints of right groin swelling after he took the dressing off.  Patient was concerned that he may be having infection.  Denies any chest pain productive cough.  ED Course: In the ER patient had a fever 101.2 F.  Labs showed leukocytosis of 18.  Hemoglobin 10.5.  ER physician discussed with on-call cardiology and cardiothoracic surgery team.  Patient was placed on empiric antibiotics concerning for possible infection given the fever.  Patient also has elevated troponin at 2000.  Chest x-ray was unremarkable.  Patient on exam denies any chest pain or shortness of breath.  Patient admitted for further observation.  Review of Systems: As per HPI, rest all negative.   Past Medical History:  Diagnosis Date   Back pain    Hx of  cardiac catheterization    a. 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 EF 35 to 40% by LHC on 05/15/14 likely due to hypertensive CM    Past Surgical History:  Procedure Laterality Date   EXPLORATION POST OPERATIVE OPEN HEART  01/08/2024   Procedure: EXPLORATION POST OPERATIVE OPEN HEART BLEEDING;  Surgeon: Kerrin Elspeth BROCKS, MD;  Location: [facility] OR;  Service: Vascular;;   EXPLORATION POST OPERATIVE OPEN HEART N/A 01/09/2024   Procedure: REVISION OF AORTIC ROOT GRAFT AND CORONARY ARTERY BYPASS GRAFT TIMES ONE;  Surgeon: Kerrin Elspeth BROCKS, MD;  Location: [facility] OR;  Service: Vascular;  Laterality: N/A;   INTRAOPERATIVE TRANSESOPHAGEAL ECHOCARDIOGRAM N/A 01/08/2024   Procedure: ECHOCARDIOGRAM, TRANSESOPHAGEAL, INTRAOPERATIVE;  Surgeon: Kerrin Elspeth BROCKS, MD;  Location: [facility] OR;  Service: Open Heart Surgery;  Laterality: N/A;   THORACIC AORTIC ANEURYSM REPAIR N/A 01/08/2024   Procedure: REPAIR, ANEURYSM, AORTA, THORACIC, ASCENDING USING GELWEAVE GRAFT;  Surgeon: Kerrin Elspeth BROCKS, MD;  Location: [facility] OR;  Service: Open Heart Surgery;  Laterality: N/A;  REPAIR TYPE 1 INTRAMURAL HEMATOMA     reports that he has been smoking. He uses smokeless tobacco. He reports current alcohol use. He reports current drug use. Drug: Marijuana.  Allergies  Allergen Reactions   Penicillins Hives   Principen [Ampicillin] Hives    Family History  Problem Relation Age of Onset  Heart attack Mother    Cancer Mother    Heart failure Maternal Grandfather    Heart attack Maternal Grandfather    Cancer Maternal Grandmother    Stroke Neg Hx     Prior to Admission medications   Medication Sig Start Date End Date Taking? Authorizing Provider  amiodarone  (PACERONE ) 200 MG tablet  Take 2 tablets (400 mg total) by mouth daily. X 5 days, then decrease 200 mg (1 tab) BID x 7 days, then decrease to 200 mg 01/14/24   Barrett, Erin R, PA-C  amLODipine  (NORVASC ) 10 MG tablet Take 1 tablet (10 mg total) by mouth daily. 01/14/24   Kerrin Elspeth BROCKS, MD  furosemide  (LASIX ) 40 MG tablet Take 1 tablet (40 mg total) by mouth daily. 01/14/24 01/13/25  Barrett, Erin R, PA-C  indomethacin (INDOCIN) 25 MG capsule Take 25 mg by mouth 2 (two) times daily.    [provider]  lisinopril -hydrochlorothiazide  (ZESTORETIC ) 20-12.5 MG tablet Take 1 tablet by mouth daily. 01/14/24   Kerrin Elspeth BROCKS, MD  magnesium  oxide (MAG-OX) 400 MG tablet Take 1 tablet (400 mg total) by mouth daily. 01/14/24   Barrett, Erin R, PA-C  metoprolol  tartrate (LOPRESSOR ) 50 MG tablet Take 1 tablet (50 mg total) by mouth 2 (two) times daily. 01/14/24 01/13/25  Barrett, Erin R, PA-C  potassium chloride  SA (KLOR-CON  M) 20 MEQ tablet Take 1 tablet (20 mEq total) by mouth daily. 01/14/24   Barrett, Erin R, PA-C  rosuvastatin  (CRESTOR ) 40 MG tablet Take 1 tablet (40 mg total) by mouth daily. 01/14/24   Barrett, Rocky SAUNDERS, PA-C    Physical Exam: Constitutional: Moderately built and nourished. Vitals:   01/17/24 0030 01/17/24 0130 01/17/24 0244 01/17/24 0344  BP: (!) 139/92 (!) 143/78 136/84 116/88  Pulse: 89 94 67 (!) 101  Resp:   16 20  Temp:   99.1 F (37.3 C) 98.9 F (37.2 C)  TempSrc:   Oral Oral  SpO2: 97% 94% 93% 93%  Weight:   106.1 kg   Height:       Eyes: Anicteric no pallor. ENMT: No discharge from the ears/nose or mouth. Neck: No mass felt.  No neck rigidity. Respiratory: No rhonchi or crepitations. Cardiovascular: S1-S2 heard. Abdomen: Soft nontender bowel sound present. Musculoskeletal: Swelling in the right groin.  Does not appear to be warm. Skin: Wounds on the chest appears healing. Neurologic: Alert awake oriented time place and person.  Moves all extremities. Psychiatric: Appears normal.  Normal  affect.   Labs on Admission: I have personally reviewed following labs and imaging studies  CBC: Recent Labs  Lab 01/11/24 0432 01/12/24 0530 01/13/24 0509 01/16/24 1810  WBC 14.5* 14.1* 18.3* 18.9*  NEUTROABS 11.0* 11.4*  --  14.0*  HGB 10.2* 9.2* 10.7* 10.5*  HCT 30.1* 27.8* 32.2* 31.4*  MCV 89.3 91.7 90.7 88.0  PLT 103* 110* 143* 363   Basic Metabolic Panel: Recent Labs  Lab 01/11/24 0432 01/12/24 0530 01/13/24 0509 01/16/24 1810  NA 138 133* 135 136  K 3.9 3.2* 3.3* 3.4*  CL 103 98 99 98  CO2 25 27 29 28   GLUCOSE 99 175* 95 92  BUN 11 8 7 9   CREATININE 1.02 0.91 0.78 0.84  CALCIUM  7.9* 7.7* 8.1* 8.7*  MG  --   --  1.7  --    GFR: Estimated Creatinine Clearance: 141.3 mL/min (by C-G formula based on SCr of 0.84 mg/dL). Liver Function Tests: Recent Labs  Lab 01/11/24 0432 01/16/24 1810  AST 116* 99*  ALT 22 42  ALKPHOS 37* 75  BILITOT 1.3* 0.9  PROT 5.0* 7.3  ALBUMIN  2.7* 3.5   No results for input(s): LIPASE, AMYLASE in the last 168 hours. No results for input(s): AMMONIA in the last 168 hours. Coagulation Profile: No results for input(s): INR, PROTIME in the last 168 hours. Cardiac Enzymes: No results for input(s): CKTOTAL, CKMB, CKMBINDEX, TROPONINI in the last 168 hours. BNP (last 3 results) No results for input(s): PROBNP in the last 8760 hours. HbA1C: No results for input(s): HGBA1C in the last 72 hours. CBG: Recent Labs  Lab 01/11/24 1624 01/11/24 1953 01/11/24 2320 01/12/24 0325 01/12/24 0754  GLUCAP 110* 105* 108* 104* 110*   Lipid Profile: No results for input(s): CHOL, HDL, LDLCALC, TRIG, CHOLHDL, LDLDIRECT in the last 72 hours. Thyroid Function Tests: No results for input(s): TSH, T4TOTAL, FREET4, T3FREE, THYROIDAB in the last 72 hours. Anemia Panel: No results for input(s): VITAMINB12, FOLATE, FERRITIN, TIBC, IRON, RETICCTPCT in the last 72 hours. Urine analysis:     Component Value Date/Time   COLORURINE YELLOW 01/16/2024 1902   APPEARANCEUR CLEAR 01/16/2024 1902   LABSPEC 1.009 01/16/2024 1902   PHURINE 8.0 01/16/2024 1902   GLUCOSEU NEGATIVE 01/16/2024 1902   HGBUR NEGATIVE 01/16/2024 1902   BILIRUBINUR NEGATIVE 01/16/2024 1902   KETONESUR NEGATIVE 01/16/2024 1902   PROTEINUR NEGATIVE 01/16/2024 1902   NITRITE NEGATIVE 01/16/2024 1902   LEUKOCYTESUR NEGATIVE 01/16/2024 1902   Sepsis Labs: @LABRCNTIP (procalcitonin:4,lacticidven:4) ) Recent Results (from the past 240 hours)  Surgical pcr screen     Status: None   Collection Time: 01/07/24  8:13 PM   Specimen: Nasal Mucosa; Nasal Swab  Result Value Ref Range Status   MRSA, PCR NEGATIVE NEGATIVE Final   Staphylococcus aureus NEGATIVE NEGATIVE Final    Comment: (NOTE) The Xpert SA Assay (FDA approved for NASAL specimens in patients 40 years of age and older), is one component of a comprehensive surveillance program. It is not intended to diagnose infection nor to guide or monitor treatment. Performed at [facility], [address], [place], [place] [address]      Radiological Exams on Admission: DG Chest Port 1 View Result Date: 01/16/2024 CLINICAL DATA:  Postop fever EXAM: PORTABLE CHEST 1 VIEW COMPARISON:  01/13/2024 FINDINGS: Post sternotomy changes. Interim removal of mediastinal drainage catheter and right IJ catheter sheath. Cardiomegaly with small pleural effusions. Improved aeration of the left lung base compared to prior. No visible pneumothorax IMPRESSION: Cardiomegaly with small pleural effusions. Improved aeration of the left lung base compared to prior. Electronically Signed   By: Luke Bun M.D.   On: 01/16/2024 18:49    EKG: Independently reviewed.  Sinus tachycardia.  Assessment/Plan Active Problems:   NICM (nonischemic cardiomyopathy) (HCC)   S/P ascending aortic replacement   Elevated troponin   Fever    Status post type a dissection repair with CABG x 1  now presenting with elevated troponin -    discussed with cardiologist.  Cardiology feels that troponin is elevated due to postoperative.  And patient is presently having no symptoms and requested no anticoagulants.  They will be seeing patient in consult.  Will be continuing with present antihypertensives. Fever with leukocytosis concerning for infection.  Could be postoperative fever.  Cultures obtained and started on empiric antibiotics. Hypertension and history of nonischemic cardiomyopathy last 2D echo was in 2015 showed EF of 35 to 40%.  Patient is presently on amlodipine , hydrochlorothiazide , lisinopril , beta-blockers and amiodarone . Anemia  likely from postoperative blood loss.  Has required transfusion last week.  Follow CBC. Right groin swelling after surgery.  Cardiology requested getting Dopplers to rule out pseudoaneurysm.   DVT prophylaxis: SCDs. Code Status: Full code. Family Communication: Discussed with patient. Disposition Plan: Cardiac telemetry. Consults called: Cardiology.  Cardiothoracic surgery. Admission status: Patient.

## 2024-01-17 NOTE — Progress Notes (Addendum)
 Rn notified by telemetry monitoring tech that patient had ST elevation on telemetry.  EKG Obtained.  Glendia Ferrier, PA notified.  Stated he had ST elevation on previous EKG, likely related to heart surgery.  Dr. Lavona came to department and looked at EKG.    No new orders received.

## 2024-01-17 NOTE — Consult Note (Addendum)
 Cardiology Consultation   Patient ID: Jeremy [name] MRN: [id number]; DOB: 1981/04/10  Admit date: 01/16/2024 Date of Consult: 01/17/2024  PCP:  Delbert Clam, MD   [facility] Providers Cardiologist:  None        Patient Profile: Jeremy [name] is a 43 y.o. male with a hx of  (HFrEF) heart failure with reduced ejection fraction    Echo (11/15):  Moderate LVH, EF 35-40%, diffuse HK, grade 1 diastolic dysfunction, trivial AI, mild to moderately dilated LAE, mild RVE, aortic root and ascending aorta normal in size LHC (11/15):  Mid LAD with intramyocardial bridge with associated 25-30% stenosis, inferior HK, EF 45%, dilated aortic root approximately 4.5 cm IntraOp TEE 01/08/24: [post op] EF 25-30, mild AI Thoracic aortic aneurysm  Acute aortic syndrome [TAA 6.7 cm w intramural hematoma] s/p repair 01/07/2024 [valve sparing] c/b massive blood loss >> s/p aortic root graft revision, CABG x 1 (S-RCA) Hypertension  Cocaine abuse  who is being seen 01/17/2024 for the evaluation of elevated Troponins at the request of Dr. Franky.  History of Present Illness: Jeremy [name] was recently admitted 6/25-7/2 with acute aortic syndrome. He underwent valve sparing repair of his ascending thoracic aorta. This was c/b continued bleeding requiring re-exploration of his mediastinum twice. He ultimately underwent revision of the aortic root graft and CABG x 1 with S-RCA. Upon stabilization and transfer out to progressive care, the patient left AMA.   He returned to [facility] ED on 01/16/24 with R groin swelling and fevers. Temp in the ED 101.2 45F. His hsTroponins were noted to be elevated and he had ant STE and inf-lat STD on EKG. The overnight cardiology fellow noted that the case was discussed with interventional cardiology who did not feel the patient need to go to the cath lab emergently.   He is seen in his room alone. He has continued to have chest soreness from his surgery.  This is unchanged. He has had some bilateral arm cramping that improves with stretching. He has not had significant shortness of breath. He feels more comfortable sleeping on an incline but does not describe orthopnea. He has had a cough with slight yellowish sputum production. No hemoptysis.   Data: K 3.2, SCr 0.85, AST 52, ALT 32 WBC 19, Hgb 10.3 Hs-Trop T 2650 >>  2262 Lactate 0.8 CXR: CM, small pl effusions EKG: sinus tachy, HR 105, normal axis, QTc 484, LVH, 2-3 mm ST depression II, III, aVF, V5-6; 1-2 mm ST elevation V1-3  Past Medical History:  Diagnosis Date   Back pain    Hx of cardiac catheterization    a. 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 EF 35 to 40% by LHC on 05/15/14 likely due to hypertensive CM    Past Surgical History:  Procedure Laterality Date   EXPLORATION POST OPERATIVE OPEN HEART  01/08/2024   Procedure: EXPLORATION POST OPERATIVE OPEN HEART BLEEDING;  Surgeon: Kerrin Elspeth BROCKS, MD;  Location: [facility] OR;  Service: Vascular;;   EXPLORATION POST OPERATIVE OPEN HEART N/A 01/09/2024   Procedure: REVISION OF AORTIC ROOT GRAFT AND CORONARY ARTERY BYPASS GRAFT TIMES ONE;  Surgeon: Kerrin Elspeth BROCKS, MD;  Location: [facility] OR;  Service: Vascular;  Laterality: N/A;   INTRAOPERATIVE TRANSESOPHAGEAL ECHOCARDIOGRAM N/A 01/08/2024  Procedure: ECHOCARDIOGRAM, TRANSESOPHAGEAL, INTRAOPERATIVE;  Surgeon: Kerrin Elspeth BROCKS, MD;  Location: [facility] OR;  Service: Open Heart Surgery;  Laterality: N/A;   THORACIC AORTIC ANEURYSM REPAIR N/A 01/08/2024   Procedure: REPAIR, ANEURYSM, AORTA, THORACIC, ASCENDING USING GELWEAVE GRAFT;  Surgeon: Kerrin Elspeth BROCKS, MD;  Location: [facility] OR;  Service: Open Heart Surgery;  Laterality: N/A;  REPAIR TYPE 1  INTRAMURAL HEMATOMA     Home Medications:  Prior to Admission medications   Medication Sig Start Date End Date Taking? Authorizing Provider  amiodarone  (PACERONE ) 200 MG tablet Take 2 tablets (400 mg total) by mouth daily. X 5 days, then decrease 200 mg (1 tab) BID x 7 days, then decrease to 200 mg 01/14/24   Barrett, Erin R, PA-C  amLODipine  (NORVASC ) 10 MG tablet Take 1 tablet (10 mg total) by mouth daily. 01/14/24   Kerrin Elspeth BROCKS, MD  furosemide  (LASIX ) 40 MG tablet Take 1 tablet (40 mg total) by mouth daily. 01/14/24 01/13/25  Barrett, Erin R, PA-C  indomethacin (INDOCIN) 25 MG capsule Take 25 mg by mouth 2 (two) times daily.    [provider]  lisinopril -hydrochlorothiazide  (ZESTORETIC ) 20-12.5 MG tablet Take 1 tablet by mouth daily. 01/14/24   Kerrin Elspeth BROCKS, MD  magnesium  oxide (MAG-OX) 400 MG tablet Take 1 tablet (400 mg total) by mouth daily. 01/14/24   Barrett, Erin R, PA-C  metoprolol  tartrate (LOPRESSOR ) 50 MG tablet Take 1 tablet (50 mg total) by mouth 2 (two) times daily. 01/14/24 01/13/25  Barrett, Erin R, PA-C  potassium chloride  SA (KLOR-CON  M) 20 MEQ tablet Take 1 tablet (20 mEq total) by mouth daily. 01/14/24   Barrett, Erin R, PA-C  rosuvastatin  (CRESTOR ) 40 MG tablet Take 1 tablet (40 mg total) by mouth daily. 01/14/24   Barrett, Erin R, PA-C    Scheduled Meds:  amiodarone   400 mg Oral Daily   Followed by   NOREEN ON 01/19/2024] amiodarone   200 mg Oral BID   Followed by   NOREEN ON 01/26/2024] amiodarone   200 mg Oral Daily   amLODipine   10 mg Oral Daily   furosemide   40 mg Oral Daily   lisinopril   20 mg Oral Daily   And   hydrochlorothiazide   12.5 mg Oral Daily   magnesium  oxide  400 mg Oral Daily   metoprolol  tartrate  50 mg Oral BID   potassium chloride  SA  20 mEq Oral Daily   rosuvastatin   40 mg Oral Daily   Continuous Infusions:  aztreonam  2 g (01/17/24 0508)   vancomycin  1,000 mg (01/17/24 0908)   PRN Meds: acetaminophen  **OR** acetaminophen , mouth  rinse  Allergies:    Allergies  Allergen Reactions   Penicillins Hives   Principen [Ampicillin] Hives    Social History:   Social History   Socioeconomic History   Marital status: Single    Spouse name: Not on file   Number of children: Not on file   Years of education: Not on file   Highest education level: Not on file  Occupational History   Not on file  Tobacco Use   Smoking status: Former    Types: Cigarettes   Smokeless tobacco: Current  Vaping Use   Vaping status: Some Days  Substance and Sexual Activity   Alcohol use: Yes   Drug use: Not Currently    Types: Marijuana, Cocaine    Comment: No cocaine since early June 2025   Sexual activity: Not on file  Other Topics Concern   Not on file  Social History Narrative   Not on file   Social Drivers of Health   Financial Resource Strain: Not on file  Food Insecurity: No Food Insecurity (01/17/2024)   Hunger Vital Sign    Worried About Running Out of Food in the Last Year: Never true    Ran Out of Food in the Last Year: Never true  Transportation Needs: No Transportation Needs (01/17/2024)   PRAPARE - Administrator, Civil Service (Medical): No    Lack of Transportation (Non-Medical): No  Physical Activity: Not on file  Stress: Not on file  Social Connections: Not on file  Intimate Partner Violence: Not At Risk (01/17/2024)   Humiliation, Afraid, Rape, and Kick questionnaire    Fear of Current or Ex-Partner: No    Emotionally Abused: No    Physically Abused: No    Sexually Abused: No    Family History:   Family History  Problem Relation Age of Onset   Heart attack Mother    Cancer Mother    Heart failure Maternal Grandfather    Heart attack Maternal Grandfather    Cancer Maternal Grandmother    Stroke Neg Hx      ROS:  Please see the history of present illness.  No melena, hematochezia, vomiting, diarrhea. All other ROS reviewed and negative.     Physical Exam/Data: Vitals:   01/17/24  0130 01/17/24 0244 01/17/24 0344 01/17/24 0829  BP: (!) 143/78 136/84 116/88   Pulse: 94 67 (!) 101   Resp:  16 20 20   Temp:  99.1 F (37.3 C) 98.9 F (37.2 C) 98.6 F (37 C)  TempSrc:  Oral Oral Oral  SpO2: 94% 93% 93%   Weight:  106.1 kg    Height:        Intake/Output Summary (Last 24 hours) at 01/17/2024 0931 Last data filed at 01/16/2024 2046 Gross per 24 hour  Intake --  Output 250 ml  Net -250 ml      01/17/2024    2:44 AM 01/16/2024    5:06 PM 01/13/2024    5:00 AM  Last 3 Weights  Weight (lbs) 233 lb 14.5 oz 260 lb 260 lb 1.6 oz  Weight (kg) 106.1 kg 117.935 kg 117.981 kg     Body mass index is 31.72 kg/m.  General:  Well nourished, well developed, in no acute distress  HEENT: normal Neck: no JVD Cardiac:  rapid regular rhythm, 2/6 systolic murmur LLSB Lungs:  clear to auscultation bilaterally, no wheezing or rales  Abd: soft, nontender   Ext: no edema Musculoskeletal:  No deformities  Skin: warm and dry  Neuro:  CNs 2-12 intact, no focal abnormalities noted Psych:  Normal affect   EKG:  The EKG was personally reviewed and demonstrates:  see HPI Telemetry:  Telemetry was personally reviewed and demonstrates:  sinus tachycardia  Laboratory Data: High Sensitivity Troponin: Recent Labs  Lab 01/07/24 1545 01/16/24 1810 01/16/24 1942  TRNPT 20* 2,650* 2,262*    Chemistry Recent Labs  Lab 01/13/24 0509 01/16/24 1810 01/17/24 0656  NA 135 136 135  K 3.3* 3.4* 3.2*  CL 99 98 100  CO2 29 28 24   GLUCOSE 95 92 113*  BUN 7 9 8   CREATININE 0.78 0.84 0.85  CALCIUM  8.1* 8.7* 8.0*  MG 1.7  --   --   GFRNONAA >60 >60 >60  ANIONGAP 7 10 11     Recent Labs  Lab 01/11/24 0432 01/16/24 1810 01/17/24 0656  PROT 5.0* 7.3  6.6  ALBUMIN  2.7* 3.5 2.7*  AST 116* 99* 52*  ALT 22 42 32  ALKPHOS 37* 75 50  BILITOT 1.3* 0.9 1.2    Hematology Recent Labs  Lab 01/13/24 0509 01/16/24 1810 01/17/24 0656  WBC 18.3* 18.9* 19.0*  RBC 3.55* 3.57* 3.43*  HGB 10.7*  10.5* 10.3*  HCT 32.2* 31.4* 30.3*  MCV 90.7 88.0 88.3  MCH 30.1 29.4 30.0  MCHC 33.2 33.4 34.0  RDW 13.9 14.0 14.1  PLT 143* 363 353   Radiology/Studies:  DG Chest Port 1 View Result Date: 01/16/2024 CLINICAL DATA:  Postop fever EXAM: PORTABLE CHEST 1 VIEW COMPARISON:  01/13/2024 FINDINGS: Post sternotomy changes. Interim removal of mediastinal drainage catheter and right IJ catheter sheath. Cardiomegaly with small pleural effusions. Improved aeration of the left lung base compared to prior. No visible pneumothorax IMPRESSION: Cardiomegaly with small pleural effusions. Improved aeration of the left lung base compared to prior. Electronically Signed   By: Luke Bun M.D.   On: 01/16/2024 18:49   Assessment and Plan: 1. Type 1 Aortic Intramural Hematoma 2. S/p aneurysm repair c/b recurrent bleeding requiring revision of repair and CABG x 1 (S-RCA) Per CT surgery. Of note, pt was placed on Amiodarone  post operatively.   3. Elevated Troponin Troponins are elevated but flat. Suspect this is all related to post operative state. EKG on admit with anterior STE and inf-lat ST depression. Suspect this is also related to postoperative state, repolarization. He has chest soreness but no symptoms to suggest ACS. At this point, I do not think he needs an ischemic evaluation. It is reasonable to update his echocardiogram to look for wall motion abnormalities and reassess LVF. - Echocardiogram (order placed by IM)  4. (HFrEF) heart failure with reduced ejection fraction  Cath in 2015 with no CAD and managed as non-ischemic cardiomyopathy. EF on intra-op TEE post surgery with EF 25-30. Volume status seems stable. - Continue Metoprolol  tartrate 50 mg twice daily  - Continue Lisinopril /hydrochlorothiazide  20/12.5 mg once daily. - Echocardiogram  - If EF remains low, consider changing ACE to ARNI Lella) and Metoprolol  Tartrate to Carvedilol  or Metoprolol  succinate +/- SGLT2 inhib and MRA.  5.  Hypertensive heart disease BP is reasonably controlled.  - Continue Amlodipine  10 mg once daily, Lasix  40 mg once daily, Lisinopril /hydrochlorothiazide  20/12.5 mg once daily, metoprolol  tartrate 50 mg twice daily   Risk Assessment/Risk Scores:    TIMI Risk Score for Unstable Angina or Non-ST Elevation MI:   The patient's TIMI risk score is 2, which indicates a 8% risk of all cause mortality, new or recurrent myocardial infarction or need for urgent revascularization in the next 14 days.     For questions or updates, please contact [facility] Please consult [email or web address] for contact info under   Signed, Glendia Ferrier, PA-C  01/17/2024 9:31 AM   History and all data above reviewed.  Patient examined.  I agree with the findings as above.  He says that he was having fevers and chills and was worried about the wound on his leg.  He was not having any new chest pain and denies SOB during my interview.  He reports that he was taking his meds and had been walking in his house and eating OK.  He says he lives with his mom and he takes care of her.   The patient exam reveals COR:RRR   no rub,  Lungs: Clear  ,  Abd: Positive bowel sounds, no rebound no guarding, Ext No  edema   .      1. Left ventricular ejection fraction, by estimation, is 30 to 35%. The  left ventricle has moderately decreased function. The left ventricle  demonstrates regional wall motion abnormalities (see scoring  diagram/findings for description). The left  ventricular internal cavity size was moderately dilated. There is severe  left ventricular hypertrophy. Left ventricular diastolic parameters are  indeterminate.   2. Right ventricular systolic function is moderately reduced. The right  ventricular size is normal. Tricuspid regurgitation signal is inadequate  for assessing PA pressure.   3. A small pericardial effusion is present.   4. The mitral valve is normal in structure. Trivial mitral valve   regurgitation.   5. The aortic valve was not well visualized. Aortic valve regurgitation  is mild. Moderate aortic valve stenosis. Vmax 2.6 m/s, MG , DI 0.30   6. Aortic root/ascending aorta has been repaired/replaced. Aortic root is  thickened   7. The inferior vena cava is normal in size with greater than 50%  respiratory variability, suggesting right atrial pressure of 3 mmHg.   All available labs, radiology testing, previous records reviewed. Agree with documented assessment and plan.   Elevated troponin:  Not thought to have an acute coronary syndrome.  He did have ST depression transiently on EKG but ST elevation more consistent with repolarization.  Continue with medical management.  Cardiomyopathy:  EF as above.  No change from previous.  Small effusion.  Will consider med titration this admission for GDMT.    Arrhythmia:  Continue amiodarone  PO.      Janzen Sacks  3:12 PM  01/17/2024

## 2024-01-17 NOTE — Progress Notes (Signed)
 PROGRESS NOTE    Jeremy [name]  FMW:[id number] DOB: January 11, 1981 DOA: 01/16/2024 PCP: Delbert Clam, MD   Brief Narrative:   He was recently admitted 6/25-7/2 with acute aortic syndrome. He underwent valve sparing repair of his ascending thoracic aorta. This was complicated by continued bleeding requiring re-exploration of his mediastinum twice. He ultimately underwent revision of the aortic root graft and CABG x 1 with S-RCA. Upon stabilization and transfer out to progressive care, the patient left AMA, now presented with concern for right groin swelling.  Assessment & Plan:  Active Problems:   NICM (nonischemic cardiomyopathy) (HCC)   S/P ascending aortic replacement   Elevated troponin   Fever   Troponinemia,POA: elevated but flat. Likely post operative state. No chest pain. Cardiology on board. 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 Required takeback x 2 for revision of aortic root graft, CABG x 1 on 6/26. He required significant blood product resuscitation for coagulopathy.  On 6/27,He was taken back to the OR for Re-exploration of mediastinum, extracorporeal circulation, coronary artery bypass grafting x1 with saphenous vein graft to the right coronary at the end of procedure.    Chronic Heart failure with reduced ejection fraction,POA: NYHA III. Non-ischemic cardiomyopathy in the setting of cocaine abuse S/p Cath in 2015 which didn't show any significant blockages. On metoprolol  tartrate and Lisinopril /hydrochlorothiazide . F/ ECHO, will adjust GDMT as appropriate. Fluid restriction, Heart healthy diet and daily weight   Fever with leukocytosis concerning for infection.  Could be postoperative fever.  Cultures obtained and started on empiric antibiotics.  Hypertension: check BP  closely and adjust meds as necessary.  Anemia likely from postoperative blood loss.  Has required transfusion last week.  Follow CBC.  Right groin swelling after surgery.  Cardiology requested getting Dopplers to rule out pseudoaneurysm.  DVT prophylaxis: SCDs Start: 01/17/24 9371     Code Status: Full Code Family Communication:   Status is: Inpatient Remains inpatient appropriate because: Cardiac monitoring, recent CABG    Subjective:  Denies chest pain or shortness of breath. He said he was concerned yesterday about his right groin. Cardiology and CTS on board. ECHO done this am.  Examination:  General exam: Appears calm and comfortable  Respiratory system: Clear to auscultation. Respiratory effort normal. Cardiovascular system: Mid line CABG scar in place, right upper chest sutures in place Gastrointestinal system: Abdomen is nondistended, soft and nontender. No organomegaly or masses felt. Normal bowel sounds heard. Central nervous system: Alert and oriented. No focal neurological deficits. Extremities: Symmetric 5 x 5 power. Skin: No rashes, lesions or ulcers Psychiatry: Judgement and insight appear normal. Mood & affect appropriate.      Diet Orders (From admission, onward)     Start     Ordered   01/17/24 0630  Diet Heart Room service appropriate? Yes; Fluid consistency: Thin  Diet effective now       Question Answer Comment  Room service appropriate? Yes   Fluid consistency: Thin      01/17/24 0633            Objective: Vitals:   01/17/24 0344 01/17/24 0829 01/17/24 0934 01/17/24 0937  BP: 116/88  129/79   Pulse: (!) 101   99  Resp: 20 20    Temp: 98.9 F (37.2 C) 98.6 F (37 C)    TempSrc:  Oral Oral    SpO2: 93%     Weight:      Height:        Intake/Output Summary (Last 24 hours) at 01/17/2024 1049 Last data filed at 01/17/2024 0945 Gross per 24 hour  Intake 240 ml  Output 250 ml  Net -10 ml   Filed Weights   01/16/24 1706 01/17/24 0244   Weight: 117.9 kg 106.1 kg    Scheduled Meds:  amiodarone   400 mg Oral Daily   Followed by   NOREEN ON 01/19/2024] amiodarone   200 mg Oral BID   Followed by   NOREEN ON 01/26/2024] amiodarone   200 mg Oral Daily   amLODipine   10 mg Oral Daily   furosemide   40 mg Oral Daily   lisinopril   20 mg Oral Daily   And   hydrochlorothiazide   12.5 mg Oral Daily   magnesium  oxide  400 mg Oral Daily   metoprolol  tartrate  50 mg Oral BID   potassium chloride  SA  20 mEq Oral Daily   rosuvastatin   40 mg Oral Daily   Continuous Infusions:  aztreonam  2 g (01/17/24 0508)   vancomycin  1,000 mg (01/17/24 0908)    Nutritional status     Body mass index is 31.72 kg/m.  Data Reviewed:   CBC: Recent Labs  Lab 01/11/24 0432 01/12/24 0530 01/13/24 0509 01/16/24 1810 01/17/24 0656  WBC 14.5* 14.1* 18.3* 18.9* 19.0*  NEUTROABS 11.0* 11.4*  --  14.0* 14.8*  HGB 10.2* 9.2* 10.7* 10.5* 10.3*  HCT 30.1* 27.8* 32.2* 31.4* 30.3*  MCV 89.3 91.7 90.7 88.0 88.3  PLT 103* 110* 143* 363 353   Basic Metabolic Panel: Recent Labs  Lab 01/11/24 0432 01/12/24 0530 01/13/24 0509 01/16/24 1810 01/17/24 0656  NA 138 133* 135 136 135  K 3.9 3.2* 3.3* 3.4* 3.2*  CL 103 98 99 98 100  CO2 25 27 29 28 24   GLUCOSE 99 175* 95 92 113*  BUN 11 8 7 9 8   CREATININE 1.02 0.91 0.78 0.84 0.85  CALCIUM  7.9* 7.7* 8.1* 8.7* 8.0*  MG  --   --  1.7  --   --    GFR: Estimated Creatinine Clearance: 139.6 mL/min (by C-G formula based on SCr of 0.85 mg/dL). Liver Function Tests: Recent Labs  Lab 01/11/24 0432 01/16/24 1810 01/17/24 0656  AST 116* 99* 52*  ALT 22 42 32  ALKPHOS 37* 75 50  BILITOT 1.3* 0.9 1.2  PROT 5.0* 7.3 6.6  ALBUMIN  2.7* 3.5 2.7*   No results for input(s): LIPASE, AMYLASE in the last 168 hours. No results for input(s): AMMONIA in the last 168 hours. Coagulation Profile: No results for input(s): INR, PROTIME in the last 168 hours. Cardiac Enzymes: No results for input(s):  CKTOTAL, CKMB, CKMBINDEX, TROPONINI in the last 168 hours. BNP (last 3 results) No results for input(s): PROBNP in the last 8760 hours. HbA1C: No results for input(s): HGBA1C in the last 72 hours. CBG: Recent Labs  Lab 01/11/24 1624 01/11/24 1953 01/11/24 2320 01/12/24 0325 01/12/24 0754  GLUCAP 110* 105* 108* 104* 110*   Lipid Profile: No results for input(s): CHOL, HDL, LDLCALC, TRIG, CHOLHDL, LDLDIRECT in the last 72 hours. Thyroid Function Tests: No results for input(s): TSH, T4TOTAL, FREET4, T3FREE, THYROIDAB in the last 72 hours. Anemia Panel: No results for input(s): VITAMINB12, FOLATE, FERRITIN, TIBC, IRON, RETICCTPCT in the last 72 hours. Sepsis Labs: Recent Labs  Lab 01/16/24 1734 01/16/24 1810  LATICACIDVEN 0.7 0.8  Recent Results (from the past 240 hours)  Surgical pcr screen     Status: None   Collection Time: 01/07/24  8:13 PM   Specimen: Nasal Mucosa; Nasal Swab  Result Value Ref Range Status   MRSA, PCR NEGATIVE NEGATIVE Final   Staphylococcus aureus NEGATIVE NEGATIVE Final    Comment: (NOTE) The Xpert SA Assay (FDA approved for NASAL specimens in patients 90 years of age and older), is one component of a comprehensive surveillance program. It is not intended to diagnose infection nor to guide or monitor treatment. Performed at [facility], [address] [place], [place] [address]          Radiology Studies: DG Chest Port 1 View Result Date: 01/16/2024 CLINICAL DATA:  Postop fever EXAM: PORTABLE CHEST 1 VIEW COMPARISON:  01/13/2024 FINDINGS: Post sternotomy changes. Interim removal of mediastinal drainage catheter and right IJ catheter sheath. Cardiomegaly with small pleural effusions. Improved aeration of the left lung base compared to prior. No visible pneumothorax IMPRESSION: Cardiomegaly with small pleural effusions. Improved aeration of the left lung base compared to prior. Electronically  Signed   By: Luke Bun M.D.   On: 01/16/2024 18:49           LOS: 0 days   Time spent= 35 mins    Deliliah Room, MD [facility]  If 7PM-7AM, please contact night-coverage  01/17/2024, 10:49 AM

## 2024-01-17 NOTE — Progress Notes (Signed)
 Echocardiogram 2D Echocardiogram has been performed.  Jeremy [name] 01/17/2024, 10:21 AM

## 2024-01-17 NOTE — Plan of Care (Signed)
   Problem: Education: Goal: Knowledge of General Education information will improve Description Including pain rating scale, medication(s)/side effects and non-pharmacologic comfort measures Outcome: Progressing

## 2024-01-17 NOTE — Progress Notes (Signed)
 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 Kerrin on 01/08/2024.  The patient's postoperative course was complicated by severe coagulopathy and bleeding.  He required to returns to the operating room for exploration.  After his coagulopathy was corrected he had no further bleeding and was extubated promptly.  Then a few days postoperatively he signed out AMA at night.  Our office has been in contact with him and his mother several times since then making sure that his medications were taken care of and follow-up arranged.  He returned to the [facility] yesterday concerned about swelling in his right groin incision with fever and tachycardia.  He was not hypotensive.  He had a fever of 101.2 in the emergency room.  White blood cell count was noted to be 18.9 with a normal lactic acid and a troponin of 2650 with a repeat at 2262.  EKG showed new ST depression and T wave inversions inferolaterally.  Chest x-ray showed cardiomegaly with clear lung fields and tiny bilateral pleural effusions.  He was admitted to the hospitalist service at [facility] for intravenous antibiotics and further workup.  This morning he said that he feels well.  Objective: Vital signs in last 24 hours: Temp:  [98.6 F (37 C)-101.2 F (38.4 C)] 98.6 F (37 C) (07/05 0829) Pulse Rate:  [67-117] 101 (07/05 0344) Cardiac Rhythm: Sinus tachycardia (07/05 0718) Resp:  [16-20] 20 (07/05 0829) BP: (113-143)/(72-117) 116/88 (07/05 0344) SpO2:  [93 %-97 %] 93 % (07/05 0344) Weight:  [106.1 kg-117.9 kg] 106.1 kg (07/05 0244)  Hemodynamic parameters for last 24 hours:    Intake/Output from previous day: 07/04 0701 - 07/05 0700 In: -  Out: 250 [Urine:250] Intake/Output this shift: No intake/output data recorded.  General appearance: alert and  cooperative Neurologic: intact Heart: regular rate and rhythm, S1, S2 normal, no murmur Lungs: clear to auscultation bilaterally Abdomen: soft, non-tender; bowel sounds normal Extremities: no edema Wound: chest incisions healing well. Sternum stable. Right groin incision intact with staples. No erythema or drainage. The swelling is just soft tissues brought together under the incision.  Lab Results: Recent Labs    01/16/24 1810 01/17/24 0656  WBC 18.9* 19.0*  HGB 10.5* 10.3*  HCT 31.4* 30.3*  PLT 363 353   BMET:  Recent Labs    01/16/24 1810 01/17/24 0656  NA 136 135  K 3.4* 3.2*  CL 98 100  CO2 28 24  GLUCOSE 92 113*  BUN 9 8  CREATININE 0.84 0.85  CALCIUM  8.7* 8.0*    PT/INR: No results for input(s): LABPROT, INR in the last 72 hours. ABG    Component Value Date/Time   PHART 7.482 (H) 01/10/2024 0438   HCO3 29.7 (H) 01/10/2024 0438   TCO2 31 01/10/2024 0438   ACIDBASEDEF 1.0 01/09/2024 0709   O2SAT 95 01/10/2024 0438   CBG (last 3)  No results for input(s): GLUCAP in the last 72 hours.  Assessment/Plan:  He is afebrile this am but had temp of 101.2 last night. Etiology of this is unclear. Lungs are clear on CXR. Incisions healing well. WBC 19 K this am. This could be infectious or inflammatory but is not unusual after this surgery. Since he has a fever I would continue antibiotic empirically. He could have some postop pericarditis as a cause of fever and leukocytosis  too. 2D echo being done this am. I think troponin levels are from his complicated long cardiac surgery. LVEF 25-30% on TEE on end of surgery. Overall he looks great considering his complicated course and signing out AMA early postop. We will follow.  LOS: 0 days    Dorise MARLA Fellers 01/17/2024

## 2024-01-17 NOTE — Plan of Care (Signed)

## 2024-01-18 ENCOUNTER — Inpatient Hospital Stay (HOSPITAL_COMMUNITY)

## 2024-01-18 DIAGNOSIS — I428 Other cardiomyopathies: Secondary | ICD-10-CM

## 2024-01-18 DIAGNOSIS — R7989 Other specified abnormal findings of blood chemistry: Secondary | ICD-10-CM | POA: Diagnosis not present

## 2024-01-18 DIAGNOSIS — M7989 Other specified soft tissue disorders: Secondary | ICD-10-CM

## 2024-01-18 DIAGNOSIS — M79651 Pain in right thigh: Secondary | ICD-10-CM

## 2024-01-18 LAB — CBC WITH DIFFERENTIAL/PLATELET
Abs Immature Granulocytes: 0.2 K/uL — ABNORMAL HIGH (ref 0.00–0.07)
Basophils Absolute: 0.1 K/uL (ref 0.0–0.1)
Basophils Relative: 0 %
Eosinophils Absolute: 0.2 K/uL (ref 0.0–0.5)
Eosinophils Relative: 1 %
HCT: 30.5 % — ABNORMAL LOW (ref 39.0–52.0)
Hemoglobin: 10.6 g/dL — ABNORMAL LOW (ref 13.0–17.0)
Immature Granulocytes: 1 %
Lymphocytes Relative: 12 %
Lymphs Abs: 2.9 K/uL (ref 0.7–4.0)
MCH: 30.5 pg (ref 26.0–34.0)
MCHC: 34.8 g/dL (ref 30.0–36.0)
MCV: 87.6 fL (ref 80.0–100.0)
Monocytes Absolute: 1.3 K/uL — ABNORMAL HIGH (ref 0.1–1.0)
Monocytes Relative: 6 %
Neutro Abs: 18.9 K/uL — ABNORMAL HIGH (ref 1.7–7.7)
Neutrophils Relative %: 80 %
Platelets: 430 K/uL — ABNORMAL HIGH (ref 150–400)
RBC: 3.48 MIL/uL — ABNORMAL LOW (ref 4.22–5.81)
RDW: 14 % (ref 11.5–15.5)
WBC: 23.5 K/uL — ABNORMAL HIGH (ref 4.0–10.5)
nRBC: 0 % (ref 0.0–0.2)

## 2024-01-18 LAB — BASIC METABOLIC PANEL WITH GFR
Anion gap: 10 (ref 5–15)
BUN: 9 mg/dL (ref 6–20)
CO2: 25 mmol/L (ref 22–32)
Calcium: 8.2 mg/dL — ABNORMAL LOW (ref 8.9–10.3)
Chloride: 99 mmol/L (ref 98–111)
Creatinine, Ser: 0.92 mg/dL (ref 0.61–1.24)
GFR, Estimated: >60 mL/min (ref >60)
Glucose, Bld: 114 mg/dL — ABNORMAL HIGH (ref 70–99)
Potassium: 3.4 mmol/L — ABNORMAL LOW (ref 3.5–5.1)
Sodium: 134 mmol/L — ABNORMAL LOW (ref 135–145)

## 2024-01-18 LAB — BRAIN NATRIURETIC PEPTIDE
B Natriuretic Peptide: 334.6 pg/mL — ABNORMAL HIGH (ref 0.0–100.0)
B Natriuretic Peptide: 307.9 pg/mL — ABNORMAL HIGH (ref 0.0–100.0)

## 2024-01-18 LAB — LACTIC ACID, PLASMA
Lactic Acid, Venous: 1.3 mmol/L (ref 0.5–1.9)
Lactic Acid, Venous: 0.9 mmol/L (ref 0.5–1.9)

## 2024-01-18 MED ORDER — MELATONIN 5 MG PO TABS
5.0000 mg | ORAL_TABLET | Freq: Every evening | ORAL | Status: DC | PRN
  Administered 2024-01-18 – 2024-01-19 (×3): 5 mg via ORAL
  Filled 2024-01-18 (×3): qty 1

## 2024-01-18 MED ORDER — ASPIRIN 325 MG PO TBEC
325.0000 mg | DELAYED_RELEASE_TABLET | Freq: Every day | ORAL | Status: DC
  Administered 2024-01-18 – 2024-01-20 (×3): 325 mg via ORAL
  Filled 2024-01-18 (×3): qty 1

## 2024-01-18 MED ORDER — LOSARTAN POTASSIUM 25 MG PO TABS
25.0000 mg | ORAL_TABLET | Freq: Every day | ORAL | Status: DC
  Administered 2024-01-18 – 2024-01-20 (×3): 25 mg via ORAL
  Filled 2024-01-18 (×3): qty 1

## 2024-01-18 NOTE — Progress Notes (Signed)
 Patient is [facility] with some laboredbreathing. Oxygen saturation is 96% on room air.

## 2024-01-18 NOTE — Progress Notes (Signed)
 Progress Note  Patient Name: Jeremy [name] Date of Encounter: 01/18/2024  Primary Cardiologist:   None   Subjective   The patient had some shortness of breath last night.    He did have a slight drop in his saturation.  He had some weakness and numbness and tingling that resolved.  This was in his digits.  Chest x-ray and EKG were nonacute.  Some sharp right sided chest pain.    Inpatient Medications    Scheduled Meds:  amiodarone   200 mg Oral BID   Followed by   NOREEN ON 01/24/2024] amiodarone   200 mg Oral Daily   dapagliflozin  propanediol  10 mg Oral Daily   furosemide   40 mg Oral Daily   hydrochlorothiazide   12.5 mg Oral Daily   magnesium  oxide  400 mg Oral Daily   metoprolol  succinate  50 mg Oral Daily   potassium chloride  SA  20 mEq Oral Daily   rosuvastatin   40 mg Oral Daily   Continuous Infusions:  aztreonam  2 g (01/18/24 0855)   vancomycin  1,000 mg (01/18/24 0526)   PRN Meds: acetaminophen  **OR** acetaminophen , melatonin, mouth rinse   Vital Signs    Vitals:   01/18/24 0513 01/18/24 0739 01/18/24 1025 01/18/24 1030  BP: 116/73 127/75 121/76   Pulse: 99   92  Resp: 16 17    Temp: 98.7 F (37.1 C) 98.7 F (37.1 C)    TempSrc: Oral Oral    SpO2: 92%     Weight:      Height:        Intake/Output Summary (Last 24 hours) at 01/18/2024 1032 Last data filed at 01/18/2024 1031 Gross per 24 hour  Intake 840 ml  Output 1555 ml  Net -715 ml   Filed Weights   01/16/24 1706 01/17/24 0244  Weight: 117.9 kg 106.1 kg    Telemetry    NSR - Personally Reviewed  ECG    NA - Personally Reviewed  Physical Exam   GEN: No acute distress.   Neck: No  JVD Cardiac: RRR, no murmurs, rubs, or gallops.  Respiratory: Clear  to auscultation bilaterally. GI: Soft, nontender, non-distended  MS: No  edema; No deformity. Neuro:  Nonfocal  Psych: Normal affect   Labs    Chemistry Recent Labs  Lab 01/13/24 0509 01/16/24 1810 01/17/24 0656  NA 135 136  135  K 3.3* 3.4* 3.2*  CL 99 98 100  CO2 29 28 24   GLUCOSE 95 92 113*  BUN 7 9 8   CREATININE 0.78 0.84 0.85  CALCIUM  8.1* 8.7* 8.0*  PROT  --  7.3 6.6  ALBUMIN   --  3.5 2.7*  AST  --  99* 52*  ALT  --  42 32  ALKPHOS  --  75 50  BILITOT  --  0.9 1.2  GFRNONAA >60 >60 >60  ANIONGAP 7 10 11      Hematology Recent Labs  Lab 01/13/24 0509 01/16/24 1810 01/17/24 0656  WBC 18.3* 18.9* 19.0*  RBC 3.55* 3.57* 3.43*  HGB 10.7* 10.5* 10.3*  HCT 32.2* 31.4* 30.3*  MCV 90.7 88.0 88.3  MCH 30.1 29.4 30.0  MCHC 33.2 33.4 34.0  RDW 13.9 14.0 14.1  PLT 143* 363 353    Cardiac EnzymesNo results for input(s): TROPONINI in the last 168 hours. No results for input(s): TROPIPOC in the last 168 hours.   BNP Recent Labs  Lab 01/18/24 0047 01/18/24 0325  BNP 334.6* 307.9*     DDimer No  results for input(s): DDIMER in the last 168 hours.   Radiology    DG CHEST PORT 1 VIEW Result Date: 01/17/2024 CLINICAL DATA:  Shortness of breath EXAM: PORTABLE CHEST 1 VIEW COMPARISON:  01/16/2024 FINDINGS: Cardiomegaly. Prior CABG. No confluent opacities, effusions or edema. No acute bony abnormality. IMPRESSION: Cardiomegaly.  No active disease. Electronically Signed   By: Franky Crease M.D.   On: 01/17/2024 23:30   ECHOCARDIOGRAM COMPLETE Result Date: 01/17/2024    ECHOCARDIOGRAM REPORT   Patient Name:   Jeremy [name] Date of Exam: 01/17/2024 Medical Rec #:  [id number]            Height:       72.0 in Accession #:    [id number]           Weight:       233.9 lb Date of Birth:  December 28, 1980            BSA:          2.277 m Patient Age:    42 years             BP:           116/88 mmHg Patient Gender: M                    HR:           103 bpm. Exam Location:  Inpatient Procedure: 2D Echo, Cardiac Doppler and Color Doppler (Both Spectral and Color            Flow Doppler were utilized during procedure). Indications:    Chest Pain R07.9  History:        Patient has prior history of Echocardiogram  examinations, most                 recent 05/16/2014. Cardiomyopathy and CHF; Risk                 Factors:Hypertension and Current Smoker.  Sonographer:    Thea Norlander RCS Referring Phys: REDIA LOISE CLEAVER IMPRESSIONS  1. Left ventricular ejection fraction, by estimation, is 30 to 35%. The left ventricle has moderately decreased function. The left ventricle demonstrates regional wall motion abnormalities (see scoring diagram/findings for description). The left ventricular internal cavity size was moderately dilated. There is severe left ventricular hypertrophy. Left ventricular diastolic parameters are indeterminate.  2. Right ventricular systolic function is moderately reduced. The right ventricular size is normal. Tricuspid regurgitation signal is inadequate for assessing PA pressure.  3. A small pericardial effusion is present.  4. The mitral valve is normal in structure. Trivial mitral valve regurgitation.  5. The aortic valve was not well visualized. Aortic valve regurgitation is mild. Moderate aortic valve stenosis. Vmax 2.6 m/s, MG , DI 0.30  6. Aortic root/ascending aorta has been repaired/replaced. Aortic root is thickened  7. The inferior vena cava is normal in size with greater than 50% respiratory variability, suggesting right atrial pressure of 3 mmHg. FINDINGS  Left Ventricle: Left ventricular ejection fraction, by estimation, is 30 to 35%. The left ventricle has moderately decreased function. The left ventricle demonstrates regional wall motion abnormalities. The left ventricular internal cavity size was moderately dilated. There is severe left ventricular hypertrophy. Left ventricular diastolic parameters are indeterminate.  LV Wall Scoring: The mid and distal anterior wall, entire septum, mid and distal inferior wall, and apex are hypokinetic. The entire lateral wall, basal anterior segment, and basal inferior segment are normal. Right Ventricle: The  right ventricular size is normal. No  increase in right ventricular wall thickness. Right ventricular systolic function is moderately reduced. Tricuspid regurgitation signal is inadequate for assessing PA pressure. Left Atrium: Left atrial size was normal in size. Right Atrium: Right atrial size was normal in size. Pericardium: A small pericardial effusion is present. Mitral Valve: The mitral valve is normal in structure. Trivial mitral valve regurgitation. Tricuspid Valve: The tricuspid valve is normal in structure. Tricuspid valve regurgitation is trivial. Aortic Valve: The aortic valve was not well visualized. Aortic valve regurgitation is mild. Moderate aortic stenosis is present. Aortic valve mean gradient measures 14.5 mmHg. Aortic valve peak gradient measures 27.6 mmHg. Aortic valve area, by VTI measures 1.36 cm. Pulmonic Valve: The pulmonic valve was not well visualized. Pulmonic valve regurgitation is trivial. Aorta: The aortic root and ascending aorta are structurally normal, with no evidence of dilitation and the aortic root/ascending aorta has been repaired/replaced. Venous: The inferior vena cava is normal in size with greater than 50% respiratory variability, suggesting right atrial pressure of 3 mmHg. IAS/Shunts: The interatrial septum was not well visualized.  LEFT VENTRICLE PLAX 2D LVIDd:         6.40 cm   Diastology LVIDs:         4.70 cm   LV e' medial:    8.38 cm/s LV PW:         1.70 cm   LV E/e' medial:  7.4 LV IVS:        1.30 cm   LV e' lateral:   11.30 cm/s LVOT diam:     2.40 cm   LV E/e' lateral: 5.5 LV SV:         54 LV SV Index:   24 LVOT Area:     4.52 cm  RIGHT VENTRICLE            IVC RV S prime:     8.85 cm/s  IVC diam: 1.60 cm TAPSE (M-mode): 1.2 cm LEFT ATRIUM             Index        RIGHT ATRIUM           Index LA diam:        3.60 cm 1.58 cm/m   RA Area:     18.90 cm LA Vol (A2C):   50.5 ml 22.18 ml/m  RA Volume:   52.40 ml  23.01 ml/m LA Vol (A4C):   53.2 ml 23.36 ml/m LA Biplane Vol: 57.0 ml 25.03 ml/m   AORTIC VALVE AV Area (Vmax):    1.38 cm AV Area (Vmean):   1.39 cm AV Area (VTI):     1.36 cm AV Vmax:           262.50 cm/s AV Vmean:          177.000 cm/s AV VTI:            0.395 m AV Peak Grad:      27.6 mmHg AV Mean Grad:      14.5 mmHg LVOT Vmax:         79.80 cm/s LVOT Vmean:        54.500 cm/s LVOT VTI:          0.119 m LVOT/AV VTI ratio: 0.30  AORTA Ao Asc diam: 3.60 cm MITRAL VALVE MV Area (PHT): 4.96 cm    SHUNTS MV Decel Time: 153 msec    Systemic VTI:  0.12 m MV E velocity: 61.60 cm/s  Systemic Diam: 2.40 cm MV A velocity: 73.30 cm/s MV E/A ratio:  0.84 Lonni Nanas MD Electronically signed by Lonni Nanas MD Signature Date/Time: 01/17/2024/3:10:13 PM    Final    DG Chest Port 1 View Result Date: 01/16/2024 CLINICAL DATA:  Postop fever EXAM: PORTABLE CHEST 1 VIEW COMPARISON:  01/13/2024 FINDINGS: Post sternotomy changes. Interim removal of mediastinal drainage catheter and right IJ catheter sheath. Cardiomegaly with small pleural effusions. Improved aeration of the left lung base compared to prior. No visible pneumothorax IMPRESSION: Cardiomegaly with small pleural effusions. Improved aeration of the left lung base compared to prior. Electronically Signed   By: Luke Bun M.D.   On: 01/16/2024 18:49    Cardiac Studies   Echo:  1. Left ventricular ejection fraction, by estimation, is 30 to 35%. The  left ventricle has moderately decreased function. The left ventricle  demonstrates regional wall motion abnormalities (see scoring  diagram/findings for description). The left  ventricular internal cavity size was moderately dilated. There is severe  left ventricular hypertrophy. Left ventricular diastolic parameters are  indeterminate.   2. Right ventricular systolic function is moderately reduced. The right  ventricular size is normal. Tricuspid regurgitation signal is inadequate  for assessing PA pressure.   3. A small pericardial effusion is present.   4. The  mitral valve is normal in structure. Trivial mitral valve  regurgitation.   5. The aortic valve was not well visualized. Aortic valve regurgitation  is mild. Moderate aortic valve stenosis. Vmax 2.6 m/s, MG , DI 0.30   6. Aortic root/ascending aorta has been repaired/replaced. Aortic root is  thickened   7. The inferior vena cava is normal in size with greater than 50%  respiratory variability, suggesting right atrial pressure of 3 mmHg.   Patient Profile     43 y.o. male with ascending root replacement and non ischemic cardiomyopathy.    Assessment & Plan    Elevated troponin:  Not thought to represent an acute coronary syndrome continue risk reduction.  This is a postoperative elevated troponin.  Chest pain is not anginal and consistent with previous recent surgery.  Trop trending down.    Chronic systolic heart failure: EF is essentially unchanged on repeat echo.   In part thought to related to hypertensive heart disease.  Appears to be net negative about 1200 cc but likely incomplete.  BNP is mildly elevated.  Chest x-ray with no acute edema.  He is oxygenating well.  Will start ARB.    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  They do have revision of the surgery with a CABG x 1.  Eventually left the ICU AGAINST MEDICAL ADVICE.    Anemia:    Stable Hgb post op.    HTN:  This is being managed in the context of treating his CHF   Medications: The patient was sent home on amiodarone  which I suspect was it for A-fib although I cannot find a clear reason.  I suggest at least continuing this for a month and discontinuing although it is not clear that he was taking this and he certainly was not taking it as prescribed at home.  Hypokalemia: Supplemented.  For questions or updates, please contact [facility] Please consult [email or web address] for contact info under Cardiology/STEMI.   Signed, Lynwood Schilling, MD  01/18/2024, 10:32 AM

## 2024-01-18 NOTE — Plan of Care (Signed)
   Problem: Nutrition: Goal: Adequate nutrition will be maintained Outcome: Progressing

## 2024-01-18 NOTE — Progress Notes (Addendum)
 O2 sats 97% on 3 L [place], liter flow decreased to 2L sats= 96%.  Patient complaining of right chest discomfort with inspiration.  Patient is more tired today, states he did not sleep well last night.  Patient answers orientation questions, follows commands, able to take AM medications.  Subtle overall well being seems different than yesterday.  Dr Dino notified.  Dr Dino states he will come see the patient.

## 2024-01-18 NOTE — Progress Notes (Signed)
 PROGRESS NOTE    Jeremy [name]  FMW:[id number] DOB: [birth year] DOA: 01/16/2024 PCP: Delbert Clam, MD   Brief Narrative:   He was recently admitted 6/25-7/2 with acute aortic syndrome. He underwent valve sparing repair of his ascending thoracic aorta. This was complicated by continued bleeding requiring re-exploration of his mediastinum twice. He ultimately underwent revision of the aortic root graft and CABG x 1 with S-RCA. Upon stabilization and transfer out to progressive care, the patient left AMA, now presented with concern for right groin swelling.  Assessment & Plan:  Active Problems:   NICM (nonischemic cardiomyopathy) (HCC)   S/P ascending aortic replacement   Elevated troponin   Fever   Troponinemia,POA: elevated but flat. Likely post operative state. No chest pain. Cardiology on board. 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 Required takeback x 2 for revision of aortic root graft, CABG x 1 on 6/26. He required significant blood product resuscitation for coagulopathy.  On 6/27,He was taken back to the OR for Re-exploration of mediastinum, extracorporeal circulation, coronary artery bypass grafting x1 with saphenous vein graft to the right coronary at the end of procedure.    Chronic Heart failure with reduced ejection fraction,POA: NYHA III. Non-ischemic cardiomyopathy in the setting of cocaine abuse S/p Cath in 2015 which didn't show any significant blockages. I reviewed the ECHO which showed EF 30-35%, regional wall motion abnormalities and severe LVH. On GDMT with Metoprolol  succinate and SGLT2i Dced Lisinopril  and patient will be started on Entresto after 36 hour wash out period. Consideration will be given to start him on MRA if BP and kidney function allows. Fluid  restriction, Heart healthy diet and daily weight   Fever with leukocytosis concerning for infection.  Could be postoperative fever.  Cultures obtained and started on empiric antibiotics.  Hypertension: check BP closely and adjust meds as necessary.  Normocytic Anemia: likely from postoperative blood loss.  Has required transfusion last week.  Follow CBC.  Right groin swelling: after surgery.  F/u arterial Dopplers to rule out pseudoaneurysm.  DVT prophylaxis: SCDs Start: 01/17/24 9371     Code Status: Full Code Family Communication:   Status is: Inpatient Remains inpatient appropriate because: Cardiac monitoring, recent CABG    Subjective:  No acute issues overnight. Denies chest pain or shortness of breath.   Examination:  General exam: Appears calm and comfortable  Respiratory system: Clear to auscultation. Respiratory effort normal. Cardiovascular system: Mid line CABG scar in place, right upper chest sutures in place Gastrointestinal system: Abdomen is nondistended, soft and nontender. No organomegaly or masses felt. Normal bowel sounds heard. Central nervous system: Alert and oriented. No focal neurological deficits. Extremities: Symmetric 5 x 5 power. Skin: No rashes, lesions or ulcers Psychiatry: Judgement and insight appear normal. Mood & affect appropriate.      Diet Orders (From admission, onward)     Start     Ordered   01/17/24 0630  Diet Heart Room service appropriate? Yes; Fluid consistency: Thin  Diet effective now       Question Answer Comment  Room service appropriate? Yes   Fluid consistency: Thin      01/17/24 0633            Objective: Vitals:   01/17/24 2300 01/17/24 2331 01/18/24 0513 01/18/24 0739  BP:  132/86 116/73 127/75  Pulse:  (!) 108 99   Resp:  20 16 17   Temp:  99.5 F (37.5 C) 98.7 F (37.1 C) 98.7 F (37.1 C)  TempSrc:  Oral Oral Oral  SpO2: 92% 97% 92%   Weight:      Height:        Intake/Output Summary (Last 24  hours) at 01/18/2024 1012 Last data filed at 01/18/2024 0749 Gross per 24 hour  Intake 720 ml  Output 1555 ml  Net -835 ml   Filed Weights   01/16/24 1706 01/17/24 0244  Weight: 117.9 kg 106.1 kg    Scheduled Meds:  amiodarone   200 mg Oral BID   Followed by   NOREEN ON 01/24/2024] amiodarone   200 mg Oral Daily   dapagliflozin  propanediol  10 mg Oral Daily   furosemide   40 mg Oral Daily   hydrochlorothiazide   12.5 mg Oral Daily   magnesium  oxide  400 mg Oral Daily   metoprolol  succinate  50 mg Oral Daily   potassium chloride  SA  20 mEq Oral Daily   rosuvastatin   40 mg Oral Daily   Continuous Infusions:  aztreonam  2 g (01/18/24 0855)   vancomycin  1,000 mg (01/18/24 0526)    Nutritional status     Body mass index is 31.72 kg/m.  Data Reviewed:   CBC: Recent Labs  Lab 01/12/24 0530 01/13/24 0509 01/16/24 1810 01/17/24 0656  WBC 14.1* 18.3* 18.9* 19.0*  NEUTROABS 11.4*  --  14.0* 14.8*  HGB 9.2* 10.7* 10.5* 10.3*  HCT 27.8* 32.2* 31.4* 30.3*  MCV 91.7 90.7 88.0 88.3  PLT 110* 143* 363 353   Basic Metabolic Panel: Recent Labs  Lab 01/12/24 0530 01/13/24 0509 01/16/24 1810 01/17/24 0656  NA 133* 135 136 135  K 3.2* 3.3* 3.4* 3.2*  CL 98 99 98 100  CO2 27 29 28 24   GLUCOSE 175* 95 92 113*  BUN 8 7 9 8   CREATININE 0.91 0.78 0.84 0.85  CALCIUM  7.7* 8.1* 8.7* 8.0*  MG  --  1.7  --   --    GFR: Estimated Creatinine Clearance: 139.6 mL/min (by C-G formula based on SCr of 0.85 mg/dL). Liver Function Tests: Recent Labs  Lab 01/16/24 1810 01/17/24 0656  AST 99* 52*  ALT 42 32  ALKPHOS 75 50  BILITOT 0.9 1.2  PROT 7.3 6.6  ALBUMIN  3.5 2.7*   No results for input(s): LIPASE, AMYLASE in the last 168 hours. No results for input(s): AMMONIA in the last 168 hours. Coagulation Profile: No results for input(s): INR, PROTIME in the last 168 hours. Cardiac Enzymes: No results for input(s): CKTOTAL, CKMB, CKMBINDEX, TROPONINI in the last 168  hours. BNP (last 3 results) No results for input(s): PROBNP in the last 8760 hours. HbA1C: No results for input(s): HGBA1C in the last 72 hours. CBG: Recent Labs  Lab 01/11/24 1624 01/11/24 1953 01/11/24 2320 01/12/24 0325 01/12/24 0754  GLUCAP 110* 105* 108* 104* 110*   Lipid Profile: No results for input(s): CHOL, HDL, LDLCALC, TRIG, CHOLHDL, LDLDIRECT in the last 72 hours. Thyroid Function Tests: No results for input(s): TSH, T4TOTAL, FREET4, T3FREE, THYROIDAB in the last 72 hours. Anemia Panel: No results for input(s): VITAMINB12, FOLATE, FERRITIN, TIBC, IRON, RETICCTPCT in the last 72 hours. Sepsis Labs: Recent Labs  Lab 01/16/24 1734 01/16/24 1810 01/18/24 0047 01/18/24 0325  LATICACIDVEN 0.7 0.8 1.3 0.9    Recent Results (from the past 240 hours)  Culture, blood (Routine X 2) w Reflex to ID Panel  Status: None (Preliminary result)   Collection Time: 01/16/24  5:32 PM   Specimen: BLOOD LEFT FOREARM  Result Value Ref Range Status   Specimen Description   Final    BLOOD LEFT FOREARM Performed at [facility], [address] [place], [place] [address]    Special Requests   Final    BOTTLES DRAWN AEROBIC AND ANAEROBIC Blood Culture adequate volume Performed at [facility], [address] [place], [place] [address]    Culture   Final    NO GROWTH < 24 HOURS Performed at [facility], [address] [place], [place] [address]    Report Status PENDING  Incomplete  Culture, blood (Routine X 2) w Reflex to ID Panel     Status: None (Preliminary result)   Collection Time: 01/16/24  5:34 PM   Specimen: BLOOD LEFT WRIST  Result Value Ref Range Status   Specimen Description   Final    BLOOD LEFT WRIST Performed at [facility], [address], [place], [place] [address]    Special Requests   Final    BOTTLES DRAWN AEROBIC AND ANAEROBIC Blood Culture  adequate volume Performed at [facility], [address] [place], [place] [address]    Culture   Final    NO GROWTH < 24 HOURS Performed at [facility], [address], [place], [place] [address]    Report Status PENDING  Incomplete         Radiology Studies: DG CHEST PORT 1 VIEW Result Date: 01/17/2024 CLINICAL DATA:  Shortness of breath EXAM: PORTABLE CHEST 1 VIEW COMPARISON:  01/16/2024 FINDINGS: Cardiomegaly. Prior CABG. No confluent opacities, effusions or edema. No acute bony abnormality. IMPRESSION: Cardiomegaly.  No active disease. Electronically Signed   By: Franky Crease M.D.   On: 01/17/2024 23:30   ECHOCARDIOGRAM COMPLETE Result Date: 01/17/2024    ECHOCARDIOGRAM REPORT   Patient Name:   Jeremy [name] Date of Exam: 01/17/2024 Medical Rec #:  [id number]            Height:       72.0 in Accession #:    [id number]           Weight:       233.9 lb Date of Birth:  1980-08-13            BSA:          2.277 m Patient Age:    42 years             BP:           116/88 mmHg Patient Gender: M                    HR:           103 bpm. Exam Location:  Inpatient Procedure: 2D Echo, Cardiac Doppler and Color Doppler (Both Spectral and Color            Flow Doppler were utilized during procedure). Indications:    Chest Pain R07.9  History:        Patient has prior history of Echocardiogram examinations, most                 recent 05/16/2014. Cardiomyopathy and CHF; Risk                 Factors:Hypertension and Current Smoker.  Sonographer:    Thea Norlander RCS Referring Phys: REDIA LOISE CLEAVER IMPRESSIONS  1. Left ventricular ejection fraction, by estimation, is 30 to 35%. The left ventricle has moderately decreased function. The left ventricle demonstrates regional wall motion abnormalities (see scoring diagram/findings for description). The left ventricular internal cavity size was moderately dilated. There is severe left ventricular hypertrophy. Left ventricular  diastolic parameters are indeterminate.  2. Right ventricular systolic function is moderately reduced. The right ventricular size is normal. Tricuspid regurgitation signal is inadequate for assessing PA pressure.  3. A small pericardial effusion is present.  4. The mitral valve is normal in structure. Trivial mitral valve regurgitation.  5. The aortic valve was not well visualized. Aortic valve regurgitation is mild. Moderate aortic valve stenosis. Vmax 2.6 m/s, MG , DI 0.30  6. Aortic root/ascending aorta has been repaired/replaced. Aortic root is thickened  7. The inferior vena cava is normal in size with greater than 50% respiratory variability, suggesting right atrial pressure of 3 mmHg. FINDINGS  Left Ventricle: Left ventricular ejection fraction, by estimation, is 30 to 35%. The left ventricle has moderately decreased function. The left ventricle demonstrates regional wall motion abnormalities. The left ventricular internal cavity size was moderately dilated. There is severe left ventricular hypertrophy. Left ventricular diastolic parameters are indeterminate.  LV Wall Scoring: The mid and distal anterior wall, entire septum, mid and distal inferior wall, and apex are hypokinetic. The entire lateral wall, basal anterior segment, and basal inferior segment are normal. Right Ventricle: The right ventricular size is normal. No increase in right ventricular wall thickness. Right ventricular systolic function is moderately reduced. Tricuspid regurgitation signal is inadequate for assessing PA pressure. Left Atrium: Left atrial size was normal in size. Right Atrium: Right atrial size was normal in size. Pericardium: A small pericardial effusion is present. Mitral Valve: The mitral valve is normal in structure. Trivial mitral valve regurgitation. Tricuspid Valve: The tricuspid valve is normal in structure. Tricuspid valve regurgitation is trivial. Aortic Valve: The aortic valve was not well visualized. Aortic  valve regurgitation is mild. Moderate aortic stenosis is present. Aortic valve mean gradient measures 14.5 mmHg. Aortic valve peak gradient measures 27.6 mmHg. Aortic valve area, by VTI measures 1.36 cm. Pulmonic Valve: The pulmonic valve was not well visualized. Pulmonic valve regurgitation is trivial. Aorta: The aortic root and ascending aorta are structurally normal, with no evidence of dilitation and the aortic root/ascending aorta has been repaired/replaced. Venous: The inferior vena cava is normal in size with greater than 50% respiratory variability, suggesting right atrial pressure of 3 mmHg. IAS/Shunts: The interatrial septum was not well visualized.  LEFT VENTRICLE PLAX 2D LVIDd:         6.40 cm   Diastology LVIDs:         4.70 cm   LV e' medial:    8.38 cm/s LV PW:         1.70 cm   LV E/e' medial:  7.4 LV IVS:        1.30 cm   LV e' lateral:   11.30 cm/s LVOT diam:     2.40 cm   LV E/e' lateral: 5.5 LV SV:         54 LV SV Index:   24 LVOT Area:     4.52 cm  RIGHT VENTRICLE            IVC RV S prime:     8.85 cm/s  IVC diam: 1.60 cm TAPSE (M-mode): 1.2 cm LEFT ATRIUM             Index  RIGHT ATRIUM           Index LA diam:        3.60 cm 1.58 cm/m   RA Area:     18.90 cm LA Vol (A2C):   50.5 ml 22.18 ml/m  RA Volume:   52.40 ml  23.01 ml/m LA Vol (A4C):   53.2 ml 23.36 ml/m LA Biplane Vol: 57.0 ml 25.03 ml/m  AORTIC VALVE AV Area (Vmax):    1.38 cm AV Area (Vmean):   1.39 cm AV Area (VTI):     1.36 cm AV Vmax:           262.50 cm/s AV Vmean:          177.000 cm/s AV VTI:            0.395 m AV Peak Grad:      27.6 mmHg AV Mean Grad:      14.5 mmHg LVOT Vmax:         79.80 cm/s LVOT Vmean:        54.500 cm/s LVOT VTI:          0.119 m LVOT/AV VTI ratio: 0.30  AORTA Ao Asc diam: 3.60 cm MITRAL VALVE MV Area (PHT): 4.96 cm    SHUNTS MV Decel Time: 153 msec    Systemic VTI:  0.12 m MV E velocity: 61.60 cm/s  Systemic Diam: 2.40 cm MV A velocity: 73.30 cm/s MV E/A ratio:  0.84 Lonni Nanas MD Electronically signed by Lonni Nanas MD Signature Date/Time: 01/17/2024/3:10:13 PM    Final    DG Chest Port 1 View Result Date: 01/16/2024 CLINICAL DATA:  Postop fever EXAM: PORTABLE CHEST 1 VIEW COMPARISON:  01/13/2024 FINDINGS: Post sternotomy changes. Interim removal of mediastinal drainage catheter and right IJ catheter sheath. Cardiomegaly with small pleural effusions. Improved aeration of the left lung base compared to prior. No visible pneumothorax IMPRESSION: Cardiomegaly with small pleural effusions. Improved aeration of the left lung base compared to prior. Electronically Signed   By: Luke Bun M.D.   On: 01/16/2024 18:49        LOS: 1 day   Time spent= 42 mins   Deliliah Room, MD [facility]  If 7PM-7AM, please contact night-coverage  01/18/2024, 10:12 AM

## 2024-01-18 NOTE — Progress Notes (Signed)
 Secure chat received from [facility] (Vascular lab tech) superficial thrombus at the site of the pain and redness on his left arm.  Dr Dino notified, order received to apply heat to site.

## 2024-01-18 NOTE — Progress Notes (Addendum)
 Patient became [facility] and labored while bathing on room air. Upon entering the room patient was sitting on the side of the bed using clipped speech. Oxygen saturation was 91-92% on room air. 2L [place] initiated. Saturation is 96-97% on room air. Patient c/o generalized weakness and numbness with tingling in all digits on the left hand. Patient states all off these symptoms just occurred and has never experienced them before. Patient denies chest pain. EKG done and CXR pending. Osude MD and Alfornia MD notified. See new orders.

## 2024-01-18 NOTE — Progress Notes (Signed)
 numbness and tingling has resolved. No chest pain, or weakness. no speech issues or stroke like symptoms  Patient is unable to sleep and would like a sleep aid. Osude MD and Alfornia MD updated. See new orders

## 2024-01-18 NOTE — Progress Notes (Signed)
 VASCULAR LAB    Left upper extremity venous duplex has been performed.  See CV proc for preliminary results.   Vanshika Jastrzebski, RVT 01/18/2024, 4:06 PM

## 2024-01-18 NOTE — Progress Notes (Signed)
 VASCULAR LAB    Right groin ultrasound to rule out pseudoaneursm has been performed.  See CV proc for preliminary results.   Addis Tuohy, RVT 01/18/2024, 4:06 PM

## 2024-01-19 DIAGNOSIS — R7989 Other specified abnormal findings of blood chemistry: Secondary | ICD-10-CM | POA: Diagnosis not present

## 2024-01-19 LAB — CBC WITH DIFFERENTIAL/PLATELET
Abs Immature Granulocytes: 0.16 K/uL — ABNORMAL HIGH (ref 0.00–0.07)
Basophils Absolute: 0 K/uL (ref 0.0–0.1)
Basophils Relative: 0 %
Eosinophils Absolute: 0.3 K/uL (ref 0.0–0.5)
Eosinophils Relative: 1 %
HCT: 31.1 % — ABNORMAL LOW (ref 39.0–52.0)
Hemoglobin: 10.4 g/dL — ABNORMAL LOW (ref 13.0–17.0)
Immature Granulocytes: 1 %
Lymphocytes Relative: 17 %
Lymphs Abs: 3.2 K/uL (ref 0.7–4.0)
MCH: 29.5 pg (ref 26.0–34.0)
MCHC: 33.4 g/dL (ref 30.0–36.0)
MCV: 88.1 fL (ref 80.0–100.0)
Monocytes Absolute: 1.1 K/uL — ABNORMAL HIGH (ref 0.1–1.0)
Monocytes Relative: 6 %
Neutro Abs: 14.5 K/uL — ABNORMAL HIGH (ref 1.7–7.7)
Neutrophils Relative %: 75 %
Platelets: 461 K/uL — ABNORMAL HIGH (ref 150–400)
RBC: 3.53 MIL/uL — ABNORMAL LOW (ref 4.22–5.81)
RDW: 14 % (ref 11.5–15.5)
WBC: 19.3 K/uL — ABNORMAL HIGH (ref 4.0–10.5)
nRBC: 0 % (ref 0.0–0.2)

## 2024-01-19 LAB — BASIC METABOLIC PANEL WITH GFR
Anion gap: 13 (ref 5–15)
BUN: 10 mg/dL (ref 6–20)
CO2: 26 mmol/L (ref 22–32)
Calcium: 8 mg/dL — ABNORMAL LOW (ref 8.9–10.3)
Chloride: 97 mmol/L — ABNORMAL LOW (ref 98–111)
Creatinine, Ser: 0.79 mg/dL (ref 0.61–1.24)
GFR, Estimated: >60 mL/min (ref >60)
Glucose, Bld: 113 mg/dL — ABNORMAL HIGH (ref 70–99)
Potassium: 3.2 mmol/L — ABNORMAL LOW (ref 3.5–5.1)
Sodium: 136 mmol/L (ref 135–145)

## 2024-01-19 MED ORDER — ALUM & MAG HYDROXIDE-SIMETH 200-200-20 MG/5ML PO SUSP
30.0000 mL | ORAL | Status: DC | PRN
  Administered 2024-01-19: 30 mL via ORAL
  Filled 2024-01-19: qty 30

## 2024-01-19 MED ORDER — POTASSIUM CHLORIDE ER 10 MEQ PO TBCR
40.0000 meq | EXTENDED_RELEASE_TABLET | Freq: Two times a day (BID) | ORAL | Status: AC
  Administered 2024-01-19 (×2): 40 meq via ORAL
  Filled 2024-01-19 (×4): qty 4

## 2024-01-19 MED ORDER — POTASSIUM CHLORIDE ER 10 MEQ PO TBCR
40.0000 meq | EXTENDED_RELEASE_TABLET | Freq: Every day | ORAL | Status: DC
  Administered 2024-01-20: 40 meq via ORAL
  Filled 2024-01-19 (×2): qty 4

## 2024-01-19 MED ORDER — GUAIFENESIN-DM 100-10 MG/5ML PO SYRP
5.0000 mL | ORAL_SOLUTION | ORAL | Status: DC | PRN
  Administered 2024-01-19: 5 mL via ORAL
  Filled 2024-01-19: qty 5

## 2024-01-19 MED ORDER — MUSCLE RUB 10-15 % EX CREA
TOPICAL_CREAM | CUTANEOUS | Status: DC | PRN
  Filled 2024-01-19: qty 85

## 2024-01-19 MED ORDER — SODIUM CHLORIDE 0.9 % IV SOLN
2.0000 g | Freq: Three times a day (TID) | INTRAVENOUS | Status: DC
  Administered 2024-01-19 – 2024-01-20 (×3): 2 g via INTRAVENOUS
  Filled 2024-01-19 (×3): qty 12.5

## 2024-01-19 NOTE — Progress Notes (Signed)
 Subjective: Pain mostly controlled, some discomfort w/ cough  Objective: Vital signs in last 24 hours: Temp:  [98.6 F (37 C)-98.9 F (37.2 C)] 98.9 F (37.2 C) (07/07 0411) Pulse Rate:  [92-100] 100 (07/06 2000) Cardiac Rhythm: Normal sinus rhythm;Sinus tachycardia (07/06 2000) Resp:  [16-18] 16 (07/07 0411) BP: (109-127)/(75-81) 126/81 (07/06 2000) SpO2:  [95 %] 95 % (07/06 2000)  Hemodynamic parameters for last 24 hours:    Intake/Output from previous day: 07/06 0701 - 07/07 0700 In: 2332 [P.O.:840; IV Piggyback:1492] Out: 1075 [Urine:1075] Intake/Output this shift: No intake/output data recorded.  General appearance: alert, cooperative, and no distress Heart: regular rate and rhythm Lungs: mildly dim right base Abdomen: benign Extremities: some ankle edema Wound: incis healing well  Lab Results: Recent Labs    01/18/24 1214 01/19/24 0419  WBC 23.5* 19.3*  HGB 10.6* 10.4*  HCT 30.5* 31.1*  PLT 430* 461*   BMET:  Recent Labs    01/17/24 0656 01/18/24 1214  NA 135 134*  K 3.2* 3.4*  CL 100 99  CO2 24 25  GLUCOSE 113* 114*  BUN 8 9  CREATININE 0.85 0.92  CALCIUM  8.0* 8.2*    PT/INR: No results for input(s): LABPROT, INR in the last 72 hours. ABG    Component Value Date/Time   PHART 7.482 (H) 01/10/2024 0438   HCO3 29.7 (H) 01/10/2024 0438   TCO2 31 01/10/2024 0438   ACIDBASEDEF 1.0 01/09/2024 0709   O2SAT 95 01/10/2024 0438   CBG (last 3)  No results for input(s): GLUCAP in the last 72 hours.  Meds Scheduled Meds:  amiodarone   200 mg Oral BID   Followed by   NOREEN ON 01/24/2024] amiodarone   200 mg Oral Daily   aspirin  EC  325 mg Oral Daily   dapagliflozin  propanediol  10 mg Oral Daily   furosemide   40 mg Oral Daily   hydrochlorothiazide   12.5 mg Oral Daily   losartan   25 mg Oral Daily   magnesium  oxide  400 mg Oral Daily   metoprolol  succinate  50 mg Oral Daily   potassium chloride  SA  20 mEq Oral Daily   rosuvastatin   40 mg  Oral Daily   Continuous Infusions:  aztreonam  Stopped (01/19/24 0038)   vancomycin  1,000 mg (01/19/24 0407)   PRN Meds:.acetaminophen  **OR** acetaminophen , alum & mag hydroxide-simeth, melatonin, Muscle Rub, mouth rinse  Xrays VAS US  UPPER EXTREMITY VENOUS DUPLEX Result Date: 01/18/2024 UPPER VENOUS STUDY  Patient Name:  JAIVEER PANAS  Date of Exam:   01/18/2024 Medical Rec #: [id number]             Accession #:    [id number] Date of Birth: [birth year]             Patient Gender: M Patient Age:   43 years Exam Location:  [facility] Procedure:      VAS US  UPPER EXTREMITY VENOUS DUPLEX Referring Phys: DELILIAH RASHID --------------------------------------------------------------------------------  Indications: Focal Pain, Swelling, and Erythema in left forearm at prior IV site. Comparison Study: No prior study Performing Technologist: Alberta Lis RVS  Examination Guidelines: A complete evaluation includes B-mode imaging, spectral Doppler, color Doppler, and power Doppler as needed of all accessible portions of each vessel. Bilateral testing is considered an integral part of a complete examination. Limited examinations for reoccurring indications may be performed as noted.  Right Findings: +----------+------------+---------+-----------+----------+-------+ RIGHT     CompressiblePhasicitySpontaneousPropertiesSummary +----------+------------+---------+-----------+----------+-------+ Subclavian  Yes       Yes                      +----------+------------+---------+-----------+----------+-------+  Left Findings: +----------+------------+---------+-----------+----------+---------------------+ LEFT      CompressiblePhasicitySpontaneousProperties       Summary        +----------+------------+---------+-----------+----------+---------------------+ IJV           Full       Yes       Yes                                     +----------+------------+---------+-----------+----------+---------------------+ Subclavian               Yes       Yes                                    +----------+------------+---------+-----------+----------+---------------------+ Axillary                 Yes       Yes                                    +----------+------------+---------+-----------+----------+---------------------+ Brachial      Full       Yes       Yes                                    +----------+------------+---------+-----------+----------+---------------------+ Radial        Full                                                        +----------+------------+---------+-----------+----------+---------------------+ Ulnar         Full                                                        +----------+------------+---------+-----------+----------+---------------------+ Cephalic      Full                                                        +----------+------------+---------+-----------+----------+---------------------+ Basilic       None                                   Acute in forearm at                                                        site of prior IV    +----------+------------+---------+-----------+----------+---------------------+  Summary:  Right: No evidence of thrombosis in the subclavian.  Left: No evidence of deep vein thrombosis in the upper extremity. Findings consistent with acute superficial vein thrombosis involving the left basilic vein at site of knotting in forearm at prior IV site, patent throughout the rest of the extremity.  *See table(s) above for measurements and observations.    Preliminary    VAS US  LOWER EXTREMITY ARTERIAL DUPLEX Result Date: 01/18/2024 LOWER EXTREMITY ARTERIAL DUPLEX STUDY Patient Name:  GUAGE EFFERSON  Date of Exam:   01/18/2024 Medical Rec #: [id number]             Accession #:    [id number] Date of Birth: May 06, 1981              Patient Gender: M Patient Age:   43 years Exam Location:  [facility] Procedure:      VAS US  LOWER EXTREMITY ARTERIAL DUPLEX Referring Phys: DELILIAH RASHID --------------------------------------------------------------------------------  Indications: Pain and swelling right thigh. 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 Complicated course with 2 reoperations and coagulopathy,              with revision and addition CABG X1.  Current ABI: N/A Limitations: Staples Comparison Study: No prior study on file Performing Technologist: Alberta Lis RVS  Examination Guidelines: A complete evaluation includes B-mode imaging, spectral Doppler, color Doppler, and power Doppler as needed of all accessible portions of each vessel. Bilateral testing is considered an integral part of a complete examination. Limited examinations for reoccurring indications may be performed as noted.   Summary: Right: No pseudoaneurysm noted. Hematoma noted in proximal thigh running the course of staples harvest site.  See table(s) above for measurements and observations.    Preliminary    DG CHEST PORT 1 VIEW Result Date: 01/17/2024 CLINICAL DATA:  Shortness of breath EXAM: PORTABLE CHEST 1 VIEW COMPARISON:  01/16/2024 FINDINGS: Cardiomegaly. Prior CABG. No confluent opacities, effusions or edema. No acute bony abnormality. IMPRESSION: Cardiomegaly.  No active disease. Electronically Signed   By: Franky Crease M.D.   On: 01/17/2024 23:30   ECHOCARDIOGRAM COMPLETE Result Date: 01/17/2024    ECHOCARDIOGRAM REPORT   Patient Name:   YVONNE PETITE Date of Exam: 01/17/2024 Medical Rec #:  [id number]            Height:       72.0 in Accession #:    [id number]           Weight:       233.9 lb Date of Birth:  Jul 30, 1980            BSA:          2.277 m Patient Age:    42 years             BP:           116/88 mmHg Patient Gender: M                    HR:            103 bpm. Exam Location:  Inpatient Procedure: 2D Echo, Cardiac Doppler and Color Doppler (Both Spectral and Color            Flow Doppler were utilized during procedure). Indications:    Chest  Pain R07.9  History:        Patient has prior history of Echocardiogram examinations, most                 recent 05/16/2014. Cardiomyopathy and CHF; Risk                 Factors:Hypertension and Current Smoker.  Sonographer:    Thea Norlander RCS Referring Phys: REDIA LOISE CLEAVER IMPRESSIONS  1. Left ventricular ejection fraction, by estimation, is 30 to 35%. The left ventricle has moderately decreased function. The left ventricle demonstrates regional wall motion abnormalities (see scoring diagram/findings for description). The left ventricular internal cavity size was moderately dilated. There is severe left ventricular hypertrophy. Left ventricular diastolic parameters are indeterminate.  2. Right ventricular systolic function is moderately reduced. The right ventricular size is normal. Tricuspid regurgitation signal is inadequate for assessing PA pressure.  3. A small pericardial effusion is present.  4. The mitral valve is normal in structure. Trivial mitral valve regurgitation.  5. The aortic valve was not well visualized. Aortic valve regurgitation is mild. Moderate aortic valve stenosis. Vmax 2.6 m/s, MG , DI 0.30  6. Aortic root/ascending aorta has been repaired/replaced. Aortic root is thickened  7. The inferior vena cava is normal in size with greater than 50% respiratory variability, suggesting right atrial pressure of 3 mmHg. FINDINGS  Left Ventricle: Left ventricular ejection fraction, by estimation, is 30 to 35%. The left ventricle has moderately decreased function. The left ventricle demonstrates regional wall motion abnormalities. The left ventricular internal cavity size was moderately dilated. There is severe left ventricular hypertrophy. Left ventricular diastolic parameters are  indeterminate.  LV Wall Scoring: The mid and distal anterior wall, entire septum, mid and distal inferior wall, and apex are hypokinetic. The entire lateral wall, basal anterior segment, and basal inferior segment are normal. Right Ventricle: The right ventricular size is normal. No increase in right ventricular wall thickness. Right ventricular systolic function is moderately reduced. Tricuspid regurgitation signal is inadequate for assessing PA pressure. Left Atrium: Left atrial size was normal in size. Right Atrium: Right atrial size was normal in size. Pericardium: A small pericardial effusion is present. Mitral Valve: The mitral valve is normal in structure. Trivial mitral valve regurgitation. Tricuspid Valve: The tricuspid valve is normal in structure. Tricuspid valve regurgitation is trivial. Aortic Valve: The aortic valve was not well visualized. Aortic valve regurgitation is mild. Moderate aortic stenosis is present. Aortic valve mean gradient measures 14.5 mmHg. Aortic valve peak gradient measures 27.6 mmHg. Aortic valve area, by VTI measures 1.36 cm. Pulmonic Valve: The pulmonic valve was not well visualized. Pulmonic valve regurgitation is trivial. Aorta: The aortic root and ascending aorta are structurally normal, with no evidence of dilitation and the aortic root/ascending aorta has been repaired/replaced. Venous: The inferior vena cava is normal in size with greater than 50% respiratory variability, suggesting right atrial pressure of 3 mmHg. IAS/Shunts: The interatrial septum was not well visualized.  LEFT VENTRICLE PLAX 2D LVIDd:         6.40 cm   Diastology LVIDs:         4.70 cm   LV e' medial:    8.38 cm/s LV PW:         1.70 cm   LV E/e' medial:  7.4 LV IVS:        1.30 cm   LV e' lateral:   11.30 cm/s LVOT diam:     2.40 cm   LV E/e'  lateral: 5.5 LV SV:         54 LV SV Index:   24 LVOT Area:     4.52 cm  RIGHT VENTRICLE            IVC RV S prime:     8.85 cm/s  IVC diam: 1.60 cm TAPSE  (M-mode): 1.2 cm LEFT ATRIUM             Index        RIGHT ATRIUM           Index LA diam:        3.60 cm 1.58 cm/m   RA Area:     18.90 cm LA Vol (A2C):   50.5 ml 22.18 ml/m  RA Volume:   52.40 ml  23.01 ml/m LA Vol (A4C):   53.2 ml 23.36 ml/m LA Biplane Vol: 57.0 ml 25.03 ml/m  AORTIC VALVE AV Area (Vmax):    1.38 cm AV Area (Vmean):   1.39 cm AV Area (VTI):     1.36 cm AV Vmax:           262.50 cm/s AV Vmean:          177.000 cm/s AV VTI:            0.395 m AV Peak Grad:      27.6 mmHg AV Mean Grad:      14.5 mmHg LVOT Vmax:         79.80 cm/s LVOT Vmean:        54.500 cm/s LVOT VTI:          0.119 m LVOT/AV VTI ratio: 0.30  AORTA Ao Asc diam: 3.60 cm MITRAL VALVE MV Area (PHT): 4.96 cm    SHUNTS MV Decel Time: 153 msec    Systemic VTI:  0.12 m MV E velocity: 61.60 cm/s  Systemic Diam: 2.40 cm MV A velocity: 73.30 cm/s MV E/A ratio:  0.84 Lonni Nanas MD Electronically signed by Lonni Nanas MD Signature Date/Time: 01/17/2024/3:10:13 PM    Final     Assessment/Plan:  1 afeb, VSS, sinus rhythm, ST dep in inferior leads since re - admission- cardiology on board, High troponins felt related to surgery, CHF- GDMT as tol per cards 2 O2 sats good on 2 liters 3 voiding well, UOP not all measured 4 + BM 5 Leukocytosis improving trend, WBC 19.3- on aztreonam  and vanco 6 H/H is stable 7 echo, only small peric efussion- full report above 8 routine pulm hygiene and rehab 9 primary management per hospitalist and cardiology   LOS: 2 days    Lemond FORBES Cera PA-C Pager [phone] 01/19/2024

## 2024-01-19 NOTE — Plan of Care (Signed)
  Problem: Clinical Measurements: Goal: Ability to maintain clinical measurements within normal limits will improve Outcome: Progressing Goal: Diagnostic test results will improve Outcome: Progressing Goal: Respiratory complications will improve Outcome: Progressing Goal: Cardiovascular complication will be avoided Outcome: Progressing   Problem: Activity: Goal: Risk for activity intolerance will decrease Outcome: Progressing   Problem: Nutrition: Goal: Adequate nutrition will be maintained Outcome: Progressing   Problem: Coping: Goal: Level of anxiety will decrease Outcome: Progressing   Problem: Pain Managment: Goal: General experience of comfort will improve and/or be controlled Outcome: Progressing   Problem: Elimination: Goal: Will not experience complications related to bowel motility Outcome: Completed/Met Goal: Will not experience complications related to urinary retention Outcome: Completed/Met

## 2024-01-19 NOTE — Progress Notes (Signed)
 PROGRESS NOTE    Jeremy [name]  FMW:[id number] DOB: [birth year] DOA: 01/16/2024 PCP: Delbert Clam, MD   Brief Narrative:   He was recently admitted 6/25-7/2 with acute aortic syndrome. He underwent valve sparing repair of his ascending thoracic aorta. This was complicated by continued bleeding requiring re-exploration of his mediastinum twice. He ultimately underwent revision of the aortic root graft and CABG x 1 with S-RCA. Upon stabilization and transfer out to progressive care, the patient left AMA, now presented with concern for right groin swelling, ruled out pseudoaneursym.  Assessment & Plan:  Active Problems:   NICM (nonischemic cardiomyopathy) (HCC)   S/P ascending aortic replacement   Elevated troponin   Fever   Troponinemia,POA: elevated but flat. Likely post operative state. No chest pain. Cardiology on board. 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 Required takeback x 2 for revision of aortic root graft, CABG x 1 on 6/26. He required significant blood product resuscitation for coagulopathy.  On 6/27,He was taken back to the OR for Re-exploration of mediastinum, extracorporeal circulation, coronary artery bypass grafting x1 with saphenous vein graft to the right coronary at the end of procedure.    Chronic Heart failure with reduced ejection fraction,POA: NYHA III. Non-ischemic cardiomyopathy in the setting of cocaine abuse S/p Cath in 2015 which didn't show any significant blockages. I reviewed the ECHO which showed EF 30-35%, regional wall motion abnormalities and severe LVH. On GDMT with Metoprolol  succinate and SGLT2i Continue with losartan  Consideration will be given to start him on MRA if BP and kidney function allows. Fluid restriction, Heart healthy diet and daily  weight   Fever with leukocytosis concerning for infection.  Could be postoperative fever.  Cultures obtained and started on empiric antibiotics. May be postoperative pericarditis Improving leukocytosis Ruled out pseudoaneurysm of right groin by arterial duplex.  Hypertension: Continue to monitor. On losartan  and metoprolol  succinate.  Normocytic Anemia: likely from postoperative blood loss.  Has required transfusion last week.  Follow CBC.  Right groin swelling: Presence of hematoma seen on arterial duplex. No evidence of pseudoaneurysm.  DVT prophylaxis: SCDs Start: 01/17/24 9371     Code Status: Full Code Family Communication:   Status is: Inpatient Remains inpatient appropriate because: Cardiac monitoring, recent CABG    Subjective:  No acute issues overnight. Slight right sided pleuritic chest pain on deep inspiration.  Examination:  General exam: Appears calm and comfortable  Respiratory system: Clear to auscultation. Respiratory effort normal. Cardiovascular system: Mid line CABG scar in place, right upper chest sutures in place Gastrointestinal system: Abdomen is nondistended, soft and nontender. No organomegaly or masses felt. Normal bowel sounds heard. Central nervous system: Alert and oriented. No focal neurological deficits. Extremities: Symmetric 5 x 5 power. Skin: No rashes, lesions or ulcers Psychiatry: Judgement and insight appear normal. Mood & affect appropriate.      Diet Orders (From admission, onward)     Start     Ordered   01/17/24 0630  Diet Heart Room service appropriate? Yes; Fluid consistency: Thin  Diet effective now       Question Answer Comment  Room service appropriate? Yes   Fluid consistency: Thin      01/17/24 0633            Objective: Vitals:   01/18/24 1624 01/18/24 2000 01/19/24 0411 01/19/24  0831  BP: 109/79 126/81  112/73  Pulse:  100  89  Resp: 18 18 16 18   Temp: 98.6 F (37 C) 98.7 F (37.1 C) 98.9 F (37.2 C)  98.8 F (37.1 C)  TempSrc: Oral Oral Oral Oral  SpO2:  95%  95%  Weight:      Height:        Intake/Output Summary (Last 24 hours) at 01/19/2024 0919 Last data filed at 01/19/2024 0830 Gross per 24 hour  Intake 2331.99 ml  Output 1275 ml  Net 1056.99 ml   Filed Weights   01/16/24 1706 01/17/24 0244  Weight: 117.9 kg 106.1 kg    Scheduled Meds:  amiodarone   200 mg Oral BID   Followed by   NOREEN ON 01/24/2024] amiodarone   200 mg Oral Daily   aspirin  EC  325 mg Oral Daily   dapagliflozin  propanediol  10 mg Oral Daily   furosemide   40 mg Oral Daily   hydrochlorothiazide   12.5 mg Oral Daily   losartan   25 mg Oral Daily   magnesium  oxide  400 mg Oral Daily   metoprolol  succinate  50 mg Oral Daily   potassium chloride  SA  20 mEq Oral Daily   rosuvastatin   40 mg Oral Daily   Continuous Infusions:  aztreonam  2 g (01/19/24 0830)   vancomycin  1,000 mg (01/19/24 0407)    Nutritional status     Body mass index is 31.72 kg/m.  Data Reviewed:   CBC: Recent Labs  Lab 01/13/24 0509 01/16/24 1810 01/17/24 0656 01/18/24 1214 01/19/24 0419  WBC 18.3* 18.9* 19.0* 23.5* 19.3*  NEUTROABS  --  14.0* 14.8* 18.9* 14.5*  HGB 10.7* 10.5* 10.3* 10.6* 10.4*  HCT 32.2* 31.4* 30.3* 30.5* 31.1*  MCV 90.7 88.0 88.3 87.6 88.1  PLT 143* 363 353 430* 461*   Basic Metabolic Panel: Recent Labs  Lab 01/13/24 0509 01/16/24 1810 01/17/24 0656 01/18/24 1214  NA 135 136 135 134*  K 3.3* 3.4* 3.2* 3.4*  CL 99 98 100 99  CO2 29 28 24 25   GLUCOSE 95 92 113* 114*  BUN 7 9 8 9   CREATININE 0.78 0.84 0.85 0.92  CALCIUM  8.1* 8.7* 8.0* 8.2*  MG 1.7  --   --   --    GFR: Estimated Creatinine Clearance: 129 mL/min (by C-G formula based on SCr of 0.92 mg/dL). Liver Function Tests: Recent Labs  Lab 01/16/24 1810 01/17/24 0656  AST 99* 52*  ALT 42 32  ALKPHOS 75 50  BILITOT 0.9 1.2  PROT 7.3 6.6  ALBUMIN  3.5 2.7*   No results for input(s): LIPASE, AMYLASE in the last 168 hours. No  results for input(s): AMMONIA in the last 168 hours. Coagulation Profile: No results for input(s): INR, PROTIME in the last 168 hours. Cardiac Enzymes: No results for input(s): CKTOTAL, CKMB, CKMBINDEX, TROPONINI in the last 168 hours. BNP (last 3 results) No results for input(s): PROBNP in the last 8760 hours. HbA1C: No results for input(s): HGBA1C in the last 72 hours. CBG: No results for input(s): GLUCAP in the last 168 hours.  Lipid Profile: No results for input(s): CHOL, HDL, LDLCALC, TRIG, CHOLHDL, LDLDIRECT in the last 72 hours. Thyroid Function Tests: No results for input(s): TSH, T4TOTAL, FREET4, T3FREE, THYROIDAB in the last 72 hours. Anemia Panel: No results for input(s): VITAMINB12, FOLATE, FERRITIN, TIBC, IRON, RETICCTPCT in the last 72 hours. Sepsis Labs: Recent Labs  Lab 01/16/24 1734 01/16/24 1810 01/18/24 0047 01/18/24 0325  LATICACIDVEN 0.7 0.8 1.3  0.9    Recent Results (from the past 240 hours)  Culture, blood (Routine X 2) w Reflex to ID Panel     Status: None (Preliminary result)   Collection Time: 01/16/24  5:32 PM   Specimen: BLOOD LEFT FOREARM  Result Value Ref Range Status   Specimen Description   Final    BLOOD LEFT FOREARM Performed at [facility], [address] [place], [place] [address]    Special Requests   Final    BOTTLES DRAWN AEROBIC AND ANAEROBIC Blood Culture adequate volume Performed at [facility], [address], [place], [place] [address]    Culture   Final    NO GROWTH 2 DAYS Performed at [facility], [address] [place], [place] [address]    Report Status PENDING  Incomplete  Culture, blood (Routine X 2) w Reflex to ID Panel     Status: None (Preliminary result)   Collection Time: 01/16/24  5:34 PM   Specimen: BLOOD LEFT WRIST  Result Value Ref Range Status   Specimen Description   Final    BLOOD LEFT WRIST Performed  at [facility], [address], [place], [place] [address]    Special Requests   Final    BOTTLES DRAWN AEROBIC AND ANAEROBIC Blood Culture adequate volume Performed at [facility], [address], [place], [place] [address]    Culture   Final    NO GROWTH 2 DAYS Performed at [facility], [address], [place], [place] [address]    Report Status PENDING  Incomplete         Radiology Studies: VAS US  UPPER EXTREMITY VENOUS DUPLEX Result Date: 01/18/2024 UPPER VENOUS STUDY  Patient Name:  ARMAAN POND  Date of Exam:   01/18/2024 Medical Rec #: [id number]             Accession #:    [id number] Date of Birth: 1980/09/07             Patient Gender: M Patient Age:   43 years Exam Location:  [facility] Procedure:      VAS US  UPPER EXTREMITY VENOUS DUPLEX Referring Phys: DELILIAH Shown Dissinger --------------------------------------------------------------------------------  Indications: Focal Pain, Swelling, and Erythema in left forearm at prior IV site. Comparison Study: No prior study Performing Technologist: Alberta Lis RVS  Examination Guidelines: A complete evaluation includes B-mode imaging, spectral Doppler, color Doppler, and power Doppler as needed of all accessible portions of each vessel. Bilateral testing is considered an integral part of a complete examination. Limited examinations for reoccurring indications may be performed as noted.  Right Findings: +----------+------------+---------+-----------+----------+-------+ RIGHT     CompressiblePhasicitySpontaneousPropertiesSummary +----------+------------+---------+-----------+----------+-------+ Subclavian               Yes       Yes                      +----------+------------+---------+-----------+----------+-------+  Left Findings: +----------+------------+---------+-----------+----------+---------------------+ LEFT      CompressiblePhasicitySpontaneousProperties        Summary        +----------+------------+---------+-----------+----------+---------------------+ IJV           Full       Yes       Yes                                    +----------+------------+---------+-----------+----------+---------------------+ Subclavian  Yes       Yes                                    +----------+------------+---------+-----------+----------+---------------------+ Axillary                 Yes       Yes                                    +----------+------------+---------+-----------+----------+---------------------+ Brachial      Full       Yes       Yes                                    +----------+------------+---------+-----------+----------+---------------------+ Radial        Full                                                        +----------+------------+---------+-----------+----------+---------------------+ Ulnar         Full                                                        +----------+------------+---------+-----------+----------+---------------------+ Cephalic      Full                                                        +----------+------------+---------+-----------+----------+---------------------+ Basilic       None                                   Acute in forearm at                                                        site of prior IV    +----------+------------+---------+-----------+----------+---------------------+  Summary:  Right: No evidence of thrombosis in the subclavian.  Left: No evidence of deep vein thrombosis in the upper extremity. Findings consistent with acute superficial vein thrombosis involving the left basilic vein at site of knotting in forearm at prior IV site, patent throughout the rest of the extremity.  *See table(s) above for measurements and observations.    Preliminary    VAS US  LOWER EXTREMITY ARTERIAL DUPLEX Result Date: 01/18/2024 LOWER EXTREMITY  ARTERIAL DUPLEX STUDY Patient Name:  KELLIN BARTLING  Date of Exam:   01/18/2024 Medical Rec #: [id number]             Accession #:    [id number] Date of Birth: 1980/11/25             Patient Gender:  M Patient Age:   12 years Exam Location:  [facility] Procedure:      VAS US  LOWER EXTREMITY ARTERIAL DUPLEX Referring Phys: DELILIAH Iman Reinertsen --------------------------------------------------------------------------------  Indications: Pain and swelling right thigh. 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 Complicated course with 2 reoperations and coagulopathy,              with revision and addition CABG X1.  Current ABI: N/A Limitations: Staples Comparison Study: No prior study on file Performing Technologist: Alberta Lis RVS  Examination Guidelines: A complete evaluation includes B-mode imaging, spectral Doppler, color Doppler, and power Doppler as needed of all accessible portions of each vessel. Bilateral testing is considered an integral part of a complete examination. Limited examinations for reoccurring indications may be performed as noted.   Summary: Right: No pseudoaneurysm noted. Hematoma noted in proximal thigh running the course of staples harvest site.  See table(s) above for measurements and observations.    Preliminary    DG CHEST PORT 1 VIEW Result Date: 01/17/2024 CLINICAL DATA:  Shortness of breath EXAM: PORTABLE CHEST 1 VIEW COMPARISON:  01/16/2024 FINDINGS: Cardiomegaly. Prior CABG. No confluent opacities, effusions or edema. No acute bony abnormality. IMPRESSION: Cardiomegaly.  No active disease. Electronically Signed   By: Franky Crease M.D.   On: 01/17/2024 23:30   ECHOCARDIOGRAM COMPLETE Result Date: 01/17/2024    ECHOCARDIOGRAM REPORT   Patient Name:   MICHAELL GRIDER Date of Exam: 01/17/2024 Medical Rec #:  [id number]            Height:       72.0 in Accession #:    [id number]            Weight:       233.9 lb Date of Birth:  01/28/1981            BSA:          2.277 m Patient Age:    42 years             BP:           116/88 mmHg Patient Gender: M                    HR:           103 bpm. Exam Location:  Inpatient Procedure: 2D Echo, Cardiac Doppler and Color Doppler (Both Spectral and Color            Flow Doppler were utilized during procedure). Indications:    Chest Pain R07.9  History:        Patient has prior history of Echocardiogram examinations, most                 recent 05/16/2014. Cardiomyopathy and CHF; Risk                 Factors:Hypertension and Current Smoker.  Sonographer:    Thea Norlander RCS Referring Phys: REDIA LOISE CLEAVER IMPRESSIONS  1. Left ventricular ejection fraction, by estimation, is 30 to 35%. The left ventricle has moderately decreased function. The left ventricle demonstrates regional wall motion abnormalities (see scoring diagram/findings for description). The left ventricular internal cavity size was moderately dilated. There is severe left ventricular hypertrophy. Left ventricular diastolic parameters are indeterminate.  2. Right ventricular systolic function  is moderately reduced. The right ventricular size is normal. Tricuspid regurgitation signal is inadequate for assessing PA pressure.  3. A small pericardial effusion is present.  4. The mitral valve is normal in structure. Trivial mitral valve regurgitation.  5. The aortic valve was not well visualized. Aortic valve regurgitation is mild. Moderate aortic valve stenosis. Vmax 2.6 m/s, MG , DI 0.30  6. Aortic root/ascending aorta has been repaired/replaced. Aortic root is thickened  7. The inferior vena cava is normal in size with greater than 50% respiratory variability, suggesting right atrial pressure of 3 mmHg. FINDINGS  Left Ventricle: Left ventricular ejection fraction, by estimation, is 30 to 35%. The left ventricle has moderately decreased function. The left ventricle demonstrates regional  wall motion abnormalities. The left ventricular internal cavity size was moderately dilated. There is severe left ventricular hypertrophy. Left ventricular diastolic parameters are indeterminate.  LV Wall Scoring: The mid and distal anterior wall, entire septum, mid and distal inferior wall, and apex are hypokinetic. The entire lateral wall, basal anterior segment, and basal inferior segment are normal. Right Ventricle: The right ventricular size is normal. No increase in right ventricular wall thickness. Right ventricular systolic function is moderately reduced. Tricuspid regurgitation signal is inadequate for assessing PA pressure. Left Atrium: Left atrial size was normal in size. Right Atrium: Right atrial size was normal in size. Pericardium: A small pericardial effusion is present. Mitral Valve: The mitral valve is normal in structure. Trivial mitral valve regurgitation. Tricuspid Valve: The tricuspid valve is normal in structure. Tricuspid valve regurgitation is trivial. Aortic Valve: The aortic valve was not well visualized. Aortic valve regurgitation is mild. Moderate aortic stenosis is present. Aortic valve mean gradient measures 14.5 mmHg. Aortic valve peak gradient measures 27.6 mmHg. Aortic valve area, by VTI measures 1.36 cm. Pulmonic Valve: The pulmonic valve was not well visualized. Pulmonic valve regurgitation is trivial. Aorta: The aortic root and ascending aorta are structurally normal, with no evidence of dilitation and the aortic root/ascending aorta has been repaired/replaced. Venous: The inferior vena cava is normal in size with greater than 50% respiratory variability, suggesting right atrial pressure of 3 mmHg. IAS/Shunts: The interatrial septum was not well visualized.  LEFT VENTRICLE PLAX 2D LVIDd:         6.40 cm   Diastology LVIDs:         4.70 cm   LV e' medial:    8.38 cm/s LV PW:         1.70 cm   LV E/e' medial:  7.4 LV IVS:        1.30 cm   LV e' lateral:   11.30 cm/s LVOT diam:      2.40 cm   LV E/e' lateral: 5.5 LV SV:         54 LV SV Index:   24 LVOT Area:     4.52 cm  RIGHT VENTRICLE            IVC RV S prime:     8.85 cm/s  IVC diam: 1.60 cm TAPSE (M-mode): 1.2 cm LEFT ATRIUM             Index        RIGHT ATRIUM           Index LA diam:        3.60 cm 1.58 cm/m   RA Area:     18.90 cm LA Vol (A2C):   50.5 ml 22.18 ml/m  RA Volume:   52.40  ml  23.01 ml/m LA Vol (A4C):   53.2 ml 23.36 ml/m LA Biplane Vol: 57.0 ml 25.03 ml/m  AORTIC VALVE AV Area (Vmax):    1.38 cm AV Area (Vmean):   1.39 cm AV Area (VTI):     1.36 cm AV Vmax:           262.50 cm/s AV Vmean:          177.000 cm/s AV VTI:            0.395 m AV Peak Grad:      27.6 mmHg AV Mean Grad:      14.5 mmHg LVOT Vmax:         79.80 cm/s LVOT Vmean:        54.500 cm/s LVOT VTI:          0.119 m LVOT/AV VTI ratio: 0.30  AORTA Ao Asc diam: 3.60 cm MITRAL VALVE MV Area (PHT): 4.96 cm    SHUNTS MV Decel Time: 153 msec    Systemic VTI:  0.12 m MV E velocity: 61.60 cm/s  Systemic Diam: 2.40 cm MV A velocity: 73.30 cm/s MV E/A ratio:  0.84 Lonni Nanas MD Electronically signed by Lonni Nanas MD Signature Date/Time: 01/17/2024/3:10:13 PM    Final         LOS: 2 days   Time spent= 41 mins   Deliliah Room, MD [facility]  If 7PM-7AM, please contact night-coverage  01/19/2024, 9:19 AM

## 2024-01-19 NOTE — Progress Notes (Signed)
 Progress Note  Patient Name: Jeremy [name] Date of Encounter: 01/19/2024  Primary Cardiologist:   None   Subjective   Still complaining of some sharp right lower chest pain with deep inspiration.  However, no acute distress and denies SOB.   Inpatient Medications    Scheduled Meds:  amiodarone   200 mg Oral BID   Followed by   NOREEN ON 01/24/2024] amiodarone   200 mg Oral Daily   aspirin  EC  325 mg Oral Daily   dapagliflozin  propanediol  10 mg Oral Daily   furosemide   40 mg Oral Daily   hydrochlorothiazide   12.5 mg Oral Daily   losartan   25 mg Oral Daily   magnesium  oxide  400 mg Oral Daily   metoprolol  succinate  50 mg Oral Daily   potassium chloride  SA  20 mEq Oral Daily   rosuvastatin   40 mg Oral Daily   Continuous Infusions:  aztreonam  Stopped (01/19/24 0038)   vancomycin  1,000 mg (01/19/24 0407)   PRN Meds: acetaminophen  **OR** acetaminophen , alum & mag hydroxide-simeth, melatonin, Muscle Rub, mouth rinse   Vital Signs    Vitals:   01/18/24 1146 01/18/24 1624 01/18/24 2000 01/19/24 0411  BP: 109/80 109/79 126/81   Pulse:   100   Resp: 18 18 18 16   Temp: 98.6 F (37 C) 98.6 F (37 C) 98.7 F (37.1 C) 98.9 F (37.2 C)  TempSrc: Oral Oral Oral Oral  SpO2:   95%   Weight:      Height:        Intake/Output Summary (Last 24 hours) at 01/19/2024 0816 Last data filed at 01/19/2024 0800 Gross per 24 hour  Intake 1971.99 ml  Output 1275 ml  Net 696.99 ml   Filed Weights   01/16/24 1706 01/17/24 0244  Weight: 117.9 kg 106.1 kg    Telemetry    NSR, PVCs - Personally Reviewed  ECG    NA - Personally Reviewed  Physical Exam   GEN: No  acute distress.   Neck: No  JVD Cardiac: RRR, no murmurs, rubs, or gallops.  Respiratory: Clear   to auscultation bilaterally. GI: Soft, nontender, non-distended, normal bowel sounds  MS:  No edema; No deformity. Neuro:   Nonfocal  Psych: Oriented and appropriate    Labs    Chemistry Recent Labs  Lab  01/16/24 1810 01/17/24 0656 01/18/24 1214  NA 136 135 134*  K 3.4* 3.2* 3.4*  CL 98 100 99  CO2 28 24 25   GLUCOSE 92 113* 114*  BUN 9 8 9   CREATININE 0.84 0.85 0.92  CALCIUM  8.7* 8.0* 8.2*  PROT 7.3 6.6  --   ALBUMIN  3.5 2.7*  --   AST 99* 52*  --   ALT 42 32  --   ALKPHOS 75 50  --   BILITOT 0.9 1.2  --   GFRNONAA >60 >60 >60  ANIONGAP 10 11 10      Hematology Recent Labs  Lab 01/17/24 0656 01/18/24 1214 01/19/24 0419  WBC 19.0* 23.5* 19.3*  RBC 3.43* 3.48* 3.53*  HGB 10.3* 10.6* 10.4*  HCT 30.3* 30.5* 31.1*  MCV 88.3 87.6 88.1  MCH 30.0 30.5 29.5  MCHC 34.0 34.8 33.4  RDW 14.1 14.0 14.0  PLT 353 430* 461*    Cardiac EnzymesNo results for input(s): TROPONINI in the last 168 hours. No results for input(s): TROPIPOC in the last 168 hours.   BNP Recent Labs  Lab 01/18/24 0047 01/18/24 0325  BNP 334.6* 307.9*  DDimer No results for input(s): DDIMER in the last 168 hours.   Radiology    VAS US  UPPER EXTREMITY VENOUS DUPLEX Result Date: 01/18/2024 UPPER VENOUS STUDY  Patient Name:  Jeremy [name]  Date of Exam:   01/18/2024 Medical Rec #: [id number]             Accession #:    [id number] Date of Birth: 04-Apr-1981             Patient Gender: M Patient Age:   43 years Exam Location:  [facility] Procedure:      VAS US  UPPER EXTREMITY VENOUS DUPLEX Referring Phys: DELILIAH RASHID --------------------------------------------------------------------------------  Indications: Focal Pain, Swelling, and Erythema in left forearm at prior IV site. Comparison Study: No prior study Performing Technologist: Alberta Lis RVS  Examination Guidelines: A complete evaluation includes B-mode imaging, spectral Doppler, color Doppler, and power Doppler as needed of all accessible portions of each vessel. Bilateral testing is considered an integral part of a complete examination. Limited examinations for reoccurring indications may be performed as noted.  Right Findings:  +----------+------------+---------+-----------+----------+-------+ RIGHT     CompressiblePhasicitySpontaneousPropertiesSummary +----------+------------+---------+-----------+----------+-------+ Subclavian               Yes       Yes                      +----------+------------+---------+-----------+----------+-------+  Left Findings: +----------+------------+---------+-----------+----------+---------------------+ LEFT      CompressiblePhasicitySpontaneousProperties       Summary        +----------+------------+---------+-----------+----------+---------------------+ IJV           Full       Yes       Yes                                    +----------+------------+---------+-----------+----------+---------------------+ Subclavian               Yes       Yes                                    +----------+------------+---------+-----------+----------+---------------------+ Axillary                 Yes       Yes                                    +----------+------------+---------+-----------+----------+---------------------+ Brachial      Full       Yes       Yes                                    +----------+------------+---------+-----------+----------+---------------------+ Radial        Full                                                        +----------+------------+---------+-----------+----------+---------------------+ Ulnar         Full                                                        +----------+------------+---------+-----------+----------+---------------------+  Cephalic      Full                                                        +----------+------------+---------+-----------+----------+---------------------+ Basilic       None                                   Acute in forearm at                                                        site of prior IV     +----------+------------+---------+-----------+----------+---------------------+  Summary:  Right: No evidence of thrombosis in the subclavian.  Left: No evidence of deep vein thrombosis in the upper extremity. Findings consistent with acute superficial vein thrombosis involving the left basilic vein at site of knotting in forearm at prior IV site, patent throughout the rest of the extremity.  *See table(s) above for measurements and observations.    Preliminary    VAS US  LOWER EXTREMITY ARTERIAL DUPLEX Result Date: 01/18/2024 LOWER EXTREMITY ARTERIAL DUPLEX STUDY Patient Name:  WON KREUZER  Date of Exam:   01/18/2024 Medical Rec #: [id number]             Accession #:    [id number] Date of Birth: 1980/08/20             Patient Gender: M Patient Age:   19 years Exam Location:  [facility] Procedure:      VAS US  LOWER EXTREMITY ARTERIAL DUPLEX Referring Phys: DELILIAH RASHID --------------------------------------------------------------------------------  Indications: Pain and swelling right thigh. 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 Complicated course with 2 reoperations and coagulopathy,              with revision and addition CABG X1.  Current ABI: N/A Limitations: Staples Comparison Study: No prior study on file Performing Technologist: Alberta Lis RVS  Examination Guidelines: A complete evaluation includes B-mode imaging, spectral Doppler, color Doppler, and power Doppler as needed of all accessible portions of each vessel. Bilateral testing is considered an integral part of a complete examination. Limited examinations for reoccurring indications may be performed as noted.   Summary: Right: No pseudoaneurysm noted. Hematoma noted in proximal thigh running the course of staples harvest site.  See table(s) above for measurements and observations.    Preliminary    DG CHEST PORT 1 VIEW Result Date:  01/17/2024 CLINICAL DATA:  Shortness of breath EXAM: PORTABLE CHEST 1 VIEW COMPARISON:  01/16/2024 FINDINGS: Cardiomegaly. Prior CABG. No confluent opacities, effusions or edema. No acute bony abnormality. IMPRESSION: Cardiomegaly.  No active disease. Electronically Signed   By: Franky Crease M.D.   On: 01/17/2024 23:30   ECHOCARDIOGRAM COMPLETE Result Date: 01/17/2024    ECHOCARDIOGRAM REPORT   Patient Name:   DAREON NUNZIATO Date of Exam: 01/17/2024 Medical Rec #:  [id number]  Height:       72.0 in Accession #:    [id number]           Weight:       233.9 lb Date of Birth:  07/07/1981            BSA:          2.277 m Patient Age:    42 years             BP:           116/88 mmHg Patient Gender: M                    HR:           103 bpm. Exam Location:  Inpatient Procedure: 2D Echo, Cardiac Doppler and Color Doppler (Both Spectral and Color            Flow Doppler were utilized during procedure). Indications:    Chest Pain R07.9  History:        Patient has prior history of Echocardiogram examinations, most                 recent 05/16/2014. Cardiomyopathy and CHF; Risk                 Factors:Hypertension and Current Smoker.  Sonographer:    Thea Norlander RCS Referring Phys: REDIA LOISE CLEAVER IMPRESSIONS  1. Left ventricular ejection fraction, by estimation, is 30 to 35%. The left ventricle has moderately decreased function. The left ventricle demonstrates regional wall motion abnormalities (see scoring diagram/findings for description). The left ventricular internal cavity size was moderately dilated. There is severe left ventricular hypertrophy. Left ventricular diastolic parameters are indeterminate.  2. Right ventricular systolic function is moderately reduced. The right ventricular size is normal. Tricuspid regurgitation signal is inadequate for assessing PA pressure.  3. A small pericardial effusion is present.  4. The mitral valve is normal in structure. Trivial mitral valve regurgitation.   5. The aortic valve was not well visualized. Aortic valve regurgitation is mild. Moderate aortic valve stenosis. Vmax 2.6 m/s, MG , DI 0.30  6. Aortic root/ascending aorta has been repaired/replaced. Aortic root is thickened  7. The inferior vena cava is normal in size with greater than 50% respiratory variability, suggesting right atrial pressure of 3 mmHg. FINDINGS  Left Ventricle: Left ventricular ejection fraction, by estimation, is 30 to 35%. The left ventricle has moderately decreased function. The left ventricle demonstrates regional wall motion abnormalities. The left ventricular internal cavity size was moderately dilated. There is severe left ventricular hypertrophy. Left ventricular diastolic parameters are indeterminate.  LV Wall Scoring: The mid and distal anterior wall, entire septum, mid and distal inferior wall, and apex are hypokinetic. The entire lateral wall, basal anterior segment, and basal inferior segment are normal. Right Ventricle: The right ventricular size is normal. No increase in right ventricular wall thickness. Right ventricular systolic function is moderately reduced. Tricuspid regurgitation signal is inadequate for assessing PA pressure. Left Atrium: Left atrial size was normal in size. Right Atrium: Right atrial size was normal in size. Pericardium: A small pericardial effusion is present. Mitral Valve: The mitral valve is normal in structure. Trivial mitral valve regurgitation. Tricuspid Valve: The tricuspid valve is normal in structure. Tricuspid valve regurgitation is trivial. Aortic Valve: The aortic valve was not well visualized. Aortic valve regurgitation is mild. Moderate aortic stenosis is present. Aortic valve mean gradient measures 14.5 mmHg. Aortic valve peak gradient measures 27.6  mmHg. Aortic valve area, by VTI measures 1.36 cm. Pulmonic Valve: The pulmonic valve was not well visualized. Pulmonic valve regurgitation is trivial. Aorta: The aortic root and  ascending aorta are structurally normal, with no evidence of dilitation and the aortic root/ascending aorta has been repaired/replaced. Venous: The inferior vena cava is normal in size with greater than 50% respiratory variability, suggesting right atrial pressure of 3 mmHg. IAS/Shunts: The interatrial septum was not well visualized.  LEFT VENTRICLE PLAX 2D LVIDd:         6.40 cm   Diastology LVIDs:         4.70 cm   LV e' medial:    8.38 cm/s LV PW:         1.70 cm   LV E/e' medial:  7.4 LV IVS:        1.30 cm   LV e' lateral:   11.30 cm/s LVOT diam:     2.40 cm   LV E/e' lateral: 5.5 LV SV:         54 LV SV Index:   24 LVOT Area:     4.52 cm  RIGHT VENTRICLE            IVC RV S prime:     8.85 cm/s  IVC diam: 1.60 cm TAPSE (M-mode): 1.2 cm LEFT ATRIUM             Index        RIGHT ATRIUM           Index LA diam:        3.60 cm 1.58 cm/m   RA Area:     18.90 cm LA Vol (A2C):   50.5 ml 22.18 ml/m  RA Volume:   52.40 ml  23.01 ml/m LA Vol (A4C):   53.2 ml 23.36 ml/m LA Biplane Vol: 57.0 ml 25.03 ml/m  AORTIC VALVE AV Area (Vmax):    1.38 cm AV Area (Vmean):   1.39 cm AV Area (VTI):     1.36 cm AV Vmax:           262.50 cm/s AV Vmean:          177.000 cm/s AV VTI:            0.395 m AV Peak Grad:      27.6 mmHg AV Mean Grad:      14.5 mmHg LVOT Vmax:         79.80 cm/s LVOT Vmean:        54.500 cm/s LVOT VTI:          0.119 m LVOT/AV VTI ratio: 0.30  AORTA Ao Asc diam: 3.60 cm MITRAL VALVE MV Area (PHT): 4.96 cm    SHUNTS MV Decel Time: 153 msec    Systemic VTI:  0.12 m MV E velocity: 61.60 cm/s  Systemic Diam: 2.40 cm MV A velocity: 73.30 cm/s MV E/A ratio:  0.84 Lonni Nanas MD Electronically signed by Lonni Nanas MD Signature Date/Time: 01/17/2024/3:10:13 PM    Final     Cardiac Studies   Echo:  1. Left ventricular ejection fraction, by estimation, is 30 to 35%. The  left ventricle has moderately decreased function. The left ventricle  demonstrates regional wall motion  abnormalities (see scoring  diagram/findings for description). The left  ventricular internal cavity size was moderately dilated. There is severe  left ventricular hypertrophy. Left ventricular diastolic parameters are  indeterminate.   2. Right ventricular systolic function is moderately reduced. The right  ventricular size is normal. Tricuspid regurgitation signal is inadequate  for assessing PA pressure.   3. A small pericardial effusion is present.   4. The mitral valve is normal in structure. Trivial mitral valve  regurgitation.   5. The aortic valve was not well visualized. Aortic valve regurgitation  is mild. Moderate aortic valve stenosis. Vmax 2.6 m/s, MG , DI 0.30   6. Aortic root/ascending aorta has been repaired/replaced. Aortic root is  thickened   7. The inferior vena cava is normal in size with greater than 50%  respiratory variability, suggesting right atrial pressure of 3 mmHg.   Patient Profile     43 y.o. male with ascending root replacement and non ischemic cardiomyopathy.    Assessment & Plan    Elevated troponin:  Not thought to represent an acute coronary syndrome continue risk reduction.  Continue management as below.  No further ischemia work up.     Chronic systolic heart failure: EF is essentially unchanged on repeat echo.   In part thought to related to hypertensive heart disease.  I/O incomplete.   Started ARB yesterday.   Ambulate in hallway and record O2 sats.    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  They do have revision of the surgery with a CABG x 1.    TCTS following and on empiric antibiotics secondary to fever.      Anemia:    Stable Hgb post op.    HTN:  This is being managed in the context of treating his CHF .  BP has trended down and will follow.  No further med titration.    Medications: The patient was sent home on amiodarone  which I suspect was  it for A-fib although I cannot find a clear reason. I would suggest one month total duration.    Hypokalemia: Supplemented.  Check BMP today.    For questions or updates, please contact [facility] Please consult [email or web address] for contact info under Cardiology/STEMI.   Signed, Lynwood Schilling, MD  01/19/2024, 8:16 AM

## 2024-01-19 NOTE — Progress Notes (Signed)
 CARDIAC REHAB PHASE I   PRE:  Rate/Rhythm: 89 SR    BP: sitting 104/64    SpO2: 93 2L, 90 RA  MODE:  Ambulation: 670 ft   POST:  Rate/Rhythm: 105 ST    BP: sitting 112/77     SpO2: 94 RA   Pt tolerated well, no c/o. He am bulated independently, stand by assist. No O2 needed  up walking. He can walk independently in hall.  Discussed with pt and mojther sternal restrictions, IS (currently 1250 ml), low sodium diet, exercise guidelines, and CRPII. Pt receptive. Will refer to [facility] CRPII.  [id number] Jeremy [name] BS, ACSM-CEP 01/19/2024 11:16 AM

## 2024-01-20 DIAGNOSIS — R5082 Postprocedural fever: Secondary | ICD-10-CM | POA: Diagnosis not present

## 2024-01-20 DIAGNOSIS — I428 Other cardiomyopathies: Secondary | ICD-10-CM | POA: Diagnosis not present

## 2024-01-20 LAB — BASIC METABOLIC PANEL WITH GFR
Anion gap: 11 (ref 5–15)
BUN: 10 mg/dL (ref 6–20)
CO2: 24 mmol/L (ref 22–32)
Calcium: 8.4 mg/dL — ABNORMAL LOW (ref 8.9–10.3)
Chloride: 100 mmol/L (ref 98–111)
Creatinine, Ser: 0.85 mg/dL (ref 0.61–1.24)
GFR, Estimated: >60 mL/min (ref >60)
Glucose, Bld: 135 mg/dL — ABNORMAL HIGH (ref 70–99)
Potassium: 3.6 mmol/L (ref 3.5–5.1)
Sodium: 135 mmol/L (ref 135–145)

## 2024-01-20 LAB — CBC WITH DIFFERENTIAL/PLATELET
Abs Immature Granulocytes: 0.16 K/uL — ABNORMAL HIGH (ref 0.00–0.07)
Basophils Absolute: 0.1 K/uL (ref 0.0–0.1)
Basophils Relative: 0 %
Eosinophils Absolute: 0.3 K/uL (ref 0.0–0.5)
Eosinophils Relative: 1 %
HCT: 28.8 % — ABNORMAL LOW (ref 39.0–52.0)
Hemoglobin: 9.5 g/dL — ABNORMAL LOW (ref 13.0–17.0)
Immature Granulocytes: 1 %
Lymphocytes Relative: 13 %
Lymphs Abs: 2.6 K/uL (ref 0.7–4.0)
MCH: 29.4 pg (ref 26.0–34.0)
MCHC: 33 g/dL (ref 30.0–36.0)
MCV: 89.2 fL (ref 80.0–100.0)
Monocytes Absolute: 1.1 K/uL — ABNORMAL HIGH (ref 0.1–1.0)
Monocytes Relative: 6 %
Neutro Abs: 15.6 K/uL — ABNORMAL HIGH (ref 1.7–7.7)
Neutrophils Relative %: 79 %
Platelets: 553 K/uL — ABNORMAL HIGH (ref 150–400)
RBC: 3.23 MIL/uL — ABNORMAL LOW (ref 4.22–5.81)
RDW: 13.8 % (ref 11.5–15.5)
WBC: 19.8 K/uL — ABNORMAL HIGH (ref 4.0–10.5)
nRBC: 0 % (ref 0.0–0.2)

## 2024-01-20 MED ORDER — MAGNESIUM OXIDE -MG SUPPLEMENT 400 (240 MG) MG PO TABS: 400.0000 mg | ORAL_TABLET | Freq: Every day | ORAL | 0 refills | Status: AC

## 2024-01-20 MED ORDER — POTASSIUM CHLORIDE CRYS ER 20 MEQ PO TBCR: 40.0000 meq | EXTENDED_RELEASE_TABLET | Freq: Two times a day (BID) | ORAL | Status: DC

## 2024-01-20 MED ORDER — METOPROLOL SUCCINATE ER 50 MG PO TB24: 50.0000 mg | ORAL_TABLET | Freq: Every day | ORAL | 0 refills | Status: DC

## 2024-01-20 MED ORDER — ASPIRIN 81 MG PO TBEC: 81.0000 mg | DELAYED_RELEASE_TABLET | Freq: Every day | ORAL | 0 refills | Status: DC

## 2024-01-20 MED ORDER — AMIODARONE HCL 200 MG PO TABS: 400.0000 mg | ORAL_TABLET | Freq: Every day | ORAL | Status: DC

## 2024-01-20 MED ORDER — LORAZEPAM 0.5 MG PO TABS
0.5000 mg | ORAL_TABLET | Freq: Once | ORAL | Status: AC | PRN
Start: 2024-01-20 — End: 2024-01-20
  Administered 2024-01-20: 0.5 mg via ORAL
  Filled 2024-01-20: qty 1

## 2024-01-20 MED ORDER — POTASSIUM CHLORIDE ER 20 MEQ PO TBCR: 20.0000 meq | EXTENDED_RELEASE_TABLET | Freq: Every day | ORAL | 0 refills | Status: DC

## 2024-01-20 MED ORDER — DOXYCYCLINE HYCLATE 100 MG PO TABS
100.0000 mg | ORAL_TABLET | Freq: Two times a day (BID) | ORAL | Status: DC
  Administered 2024-01-20: 100 mg via ORAL
  Filled 2024-01-20: qty 1

## 2024-01-20 MED ORDER — DOXYCYCLINE HYCLATE 100 MG PO TABS: 100.0000 mg | ORAL_TABLET | Freq: Two times a day (BID) | ORAL | 0 refills | Status: DC

## 2024-01-20 MED ORDER — MELATONIN 5 MG PO TABS: 5.0000 mg | ORAL_TABLET | Freq: Every evening | ORAL | 0 refills | Status: DC | PRN

## 2024-01-20 MED ORDER — LOSARTAN POTASSIUM 25 MG PO TABS: 25.0000 mg | ORAL_TABLET | Freq: Every day | ORAL | 0 refills | Status: DC

## 2024-01-20 MED ORDER — DAPAGLIFLOZIN PROPANEDIOL 10 MG PO TABS: 10.0000 mg | ORAL_TABLET | Freq: Every day | ORAL | 0 refills | Status: DC

## 2024-01-20 NOTE — Progress Notes (Addendum)
 0730 patient alert able to make all need known on room air staples to right groin CDI hematoma present 1244 15 staples in right groin removed 3 sutures to abdomen removed pt tolerated well PIV removed

## 2024-01-20 NOTE — Progress Notes (Signed)
 Pt requesting Xanax  or something for anxiety. Provider on call notified via secure chat

## 2024-01-20 NOTE — Progress Notes (Signed)
 Progress Note  Patient Name: Jeremy [name] Date of Encounter: 01/20/2024  Primary Cardiologist:   None   Subjective   No chest pain.  No SOB.   Inpatient Medications    Scheduled Meds:  amiodarone   200 mg Oral BID   Followed by   NOREEN ON 01/24/2024] amiodarone   200 mg Oral Daily   aspirin  EC  325 mg Oral Daily   dapagliflozin  propanediol  10 mg Oral Daily   furosemide   40 mg Oral Daily   hydrochlorothiazide   12.5 mg Oral Daily   losartan   25 mg Oral Daily   magnesium  oxide  400 mg Oral Daily   metoprolol  succinate  50 mg Oral Daily   potassium chloride   40 mEq Oral Daily   rosuvastatin   40 mg Oral Daily   Continuous Infusions:  ceFEPime  (MAXIPIME ) IV 2 g (01/20/24 0603)   vancomycin  1,000 mg (01/20/24 0440)   PRN Meds: acetaminophen  **OR** acetaminophen , alum & mag hydroxide-simeth, guaiFENesin -dextromethorphan , melatonin, Muscle Rub, mouth rinse   Vital Signs    Vitals:   01/19/24 2032 01/20/24 0037 01/20/24 0540 01/20/24 0736  BP: 107/69 126/72 (!) 99/59 110/68  Pulse:  85 90 89  Resp: 18 17 18 17   Temp: 99.3 F (37.4 C) 99 F (37.2 C) 99.2 F (37.3 C) 98.7 F (37.1 C)  TempSrc: Oral Oral Oral Oral  SpO2: 94% 98% 95% 94%  Weight:      Height:        Intake/Output Summary (Last 24 hours) at 01/20/2024 0855 Last data filed at 01/20/2024 0751 Gross per 24 hour  Intake 2120.12 ml  Output 1775 ml  Net 345.12 ml   Filed Weights   01/16/24 1706 01/17/24 0244  Weight: 117.9 kg 106.1 kg    Telemetry    NSR - Personally Reviewed  ECG    NA - Personally Reviewed  Physical Exam   GEN: No  acute distress.   Neck: No  JVD Cardiac: RRR, no murmurs, rubs, or gallops.  Respiratory: Clear   to auscultation bilaterally. GI: Soft, nontender, non-distended, normal bowel sounds  MS:  No edema; No deformity. Neuro:   Nonfocal  Psych: Oriented and appropriate     Labs    Chemistry Recent Labs  Lab 01/16/24 1810 01/17/24 0656 01/18/24 1214  01/19/24 0822  NA 136 135 134* 136  K 3.4* 3.2* 3.4* 3.2*  CL 98 100 99 97*  CO2 28 24 25 26   GLUCOSE 92 113* 114* 113*  BUN 9 8 9 10   CREATININE 0.84 0.85 0.92 0.79  CALCIUM  8.7* 8.0* 8.2* 8.0*  PROT 7.3 6.6  --   --   ALBUMIN  3.5 2.7*  --   --   AST 99* 52*  --   --   ALT 42 32  --   --   ALKPHOS 75 50  --   --   BILITOT 0.9 1.2  --   --   GFRNONAA >60 >60 >60 >60  ANIONGAP 10 11 10 13      Hematology Recent Labs  Lab 01/18/24 1214 01/19/24 0419 01/20/24 0609  WBC 23.5* 19.3* 19.8*  RBC 3.48* 3.53* 3.23*  HGB 10.6* 10.4* 9.5*  HCT 30.5* 31.1* 28.8*  MCV 87.6 88.1 89.2  MCH 30.5 29.5 29.4  MCHC 34.8 33.4 33.0  RDW 14.0 14.0 13.8  PLT 430* 461* 553*    Cardiac EnzymesNo results for input(s): TROPONINI in the last 168 hours. No results for input(s): TROPIPOC in the  last 168 hours.   BNP Recent Labs  Lab 01/18/24 0047 01/18/24 0325  BNP 334.6* 307.9*     DDimer No results for input(s): DDIMER in the last 168 hours.   Radiology    VAS US  UPPER EXTREMITY VENOUS DUPLEX Result Date: 01/19/2024 UPPER VENOUS STUDY  Patient Name:  DEMETREUS LOTHAMER  Date of Exam:   01/18/2024 Medical Rec #: [id number]             Accession #:    [id number] Date of Birth: 16-Jun-1981             Patient Gender: M Patient Age:   43 years Exam Location:  [facility] Procedure:      VAS US  UPPER EXTREMITY VENOUS DUPLEX Referring Phys: DELILIAH RASHID --------------------------------------------------------------------------------  Indications: Focal Pain, Swelling, and Erythema in left forearm at prior IV site. Comparison Study: No prior study Performing Technologist: Alberta Lis RVS  Examination Guidelines: A complete evaluation includes B-mode imaging, spectral Doppler, color Doppler, and power Doppler as needed of all accessible portions of each vessel. Bilateral testing is considered an integral part of a complete examination. Limited examinations for reoccurring indications may  be performed as noted.  Right Findings: +----------+------------+---------+-----------+----------+-------+ RIGHT     CompressiblePhasicitySpontaneousPropertiesSummary +----------+------------+---------+-----------+----------+-------+ Subclavian               Yes       Yes                      +----------+------------+---------+-----------+----------+-------+  Left Findings: +----------+------------+---------+-----------+----------+---------------------+ LEFT      CompressiblePhasicitySpontaneousProperties       Summary        +----------+------------+---------+-----------+----------+---------------------+ IJV           Full       Yes       Yes                                    +----------+------------+---------+-----------+----------+---------------------+ Subclavian               Yes       Yes                                    +----------+------------+---------+-----------+----------+---------------------+ Axillary                 Yes       Yes                                    +----------+------------+---------+-----------+----------+---------------------+ Brachial      Full       Yes       Yes                                    +----------+------------+---------+-----------+----------+---------------------+ Radial        Full                                                        +----------+------------+---------+-----------+----------+---------------------+ Ulnar  Full                                                        +----------+------------+---------+-----------+----------+---------------------+ Cephalic      Full                                                        +----------+------------+---------+-----------+----------+---------------------+ Basilic       None                                   Acute in forearm at                                                        site of prior IV     +----------+------------+---------+-----------+----------+---------------------+  Summary:  Right: No evidence of thrombosis in the subclavian.  Left: No evidence of deep vein thrombosis in the upper extremity. Findings consistent with acute superficial vein thrombosis involving the left basilic vein at site of knotting in forearm at prior IV site, patent throughout the rest of the extremity.  *See table(s) above for measurements and observations.  Diagnosing physician: Gaile New MD Electronically signed by Gaile New MD on 01/19/2024 at 2:27:17 PM.    Final    VAS US  LOWER EXTREMITY ARTERIAL DUPLEX Result Date: 01/19/2024 LOWER EXTREMITY ARTERIAL DUPLEX STUDY Patient Name:  TANNOR PYON  Date of Exam:   01/18/2024 Medical Rec #: [id number]             Accession #:    [id number] Date of Birth: [birth year]             Patient Gender: M Patient Age:   78 years Exam Location:  [facility] Procedure:      VAS US  LOWER EXTREMITY ARTERIAL DUPLEX Referring Phys: DELILIAH RASHID --------------------------------------------------------------------------------  Indications: Pain and swelling right thigh. 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 Complicated course with 2 reoperations and coagulopathy,              with revision and addition CABG X1.  Current ABI: N/A Limitations: Staples Comparison Study: No prior study on file Performing Technologist: Alberta Lis RVS  Examination Guidelines: A complete evaluation includes B-mode imaging, spectral Doppler, color Doppler, and power Doppler as needed of all accessible portions of each vessel. Bilateral testing is considered an integral part of a complete examination. Limited examinations for reoccurring indications may be performed as noted.   Summary: Right: No pseudoaneurysm noted. Hematoma noted in proximal thigh running the course of staples harvest site.  See table(s)  above for measurements and observations. Electronically signed by Gaile New MD on 01/19/2024 at 2:27:00 PM.    Final     Cardiac Studies   Echo:  1. Left ventricular ejection fraction, by estimation, is 30 to 35%. The  left ventricle has moderately decreased function. The left ventricle  demonstrates regional wall motion abnormalities (see scoring  diagram/findings for description). The left  ventricular internal cavity size was moderately dilated. There is severe  left ventricular hypertrophy. Left ventricular diastolic parameters are  indeterminate.   2. Right ventricular systolic function is moderately reduced. The right  ventricular size is normal. Tricuspid regurgitation signal is inadequate  for assessing PA pressure.   3. A small pericardial effusion is present.   4. The mitral valve is normal in structure. Trivial mitral valve  regurgitation.   5. The aortic valve was not well visualized. Aortic valve regurgitation  is mild. Moderate aortic valve stenosis. Vmax 2.6 m/s, MG , DI 0.30   6. Aortic root/ascending aorta has been repaired/replaced. Aortic root is  thickened   7. The inferior vena cava is normal in size with greater than 50%  respiratory variability, suggesting right atrial pressure of 3 mmHg.   Patient Profile     43 y.o. male with ascending root replacement and non ischemic cardiomyopathy.    Assessment & Plan     Chronic systolic heart failure: EF is essentially unchanged on repeat echo.   Meds as listed.  Unable to titrate further currently with low BP.  Plan to titrate as an outpatient.  I will stop hydrochlorothiazide  as below.   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  They do have revision of the surgery with a CABG x 1.    TCTS following and on empiric antibiotics secondary to fever.      Anemia:    Stable Hgb post op.    Reduce to 81 mg of ASA at discharge.    HTN:  This is being managed in the context of treating his CHF .  BP has trended down and will follow.  No further med titration.    Medications: Stop amiodarone  after total one month of treatment as an out patient.    Hypokalemia: Supplemented.  Remains low.  I am stopping hydrochlorothiazide  and give an additional 80 meq today.    For questions or updates, please contact [facility] Please consult [email or web address] for contact info under Cardiology/STEMI.   Signed, Lynwood Schilling, MD  01/20/2024, 8:55 AM

## 2024-01-20 NOTE — Progress Notes (Addendum)
 [address]       [name] [address]             [phone]         Subjective: Sitting up in bed. No new concerns. Pain controlled, occasional cough that is not productive. Appetite OK, BM 2 days ago. Denies dysuria.   Admits he was confused and not himself prior to this admission and when he left AMA but is thinking a lot more clearly now.    Objective: Vital signs in last 24 hours: Temp:  [98.2 F (36.8 C)-99.3 F (37.4 C)] 99.2 F (37.3 C) (07/08 0540) Pulse Rate:  [85-91] 90 (07/08 0540) Cardiac Rhythm: Normal sinus rhythm;Sinus tachycardia (07/07 2030) Resp:  [17-18] 18 (07/08 0540) BP: (96-126)/(59-73) 99/59 (07/08 0540) SpO2:  [93 %-98 %] 95 % (07/08 0540)   Intake/Output from previous day: 07/07 0701 - 07/08 0700 In: 2480.1 [P.O.:1680; IV Piggyback:800.1] Out: 1325 [Urine:1325] Intake/Output this shift: Total I/O In: -  Out: 325 [Blood:325]  General appearance: alert, cooperative, and no distress Heart: regular rate and rhythm Lungs: normal work of breathing on RA. Breath sounds are clear and equal.  Abdomen: soft, no tenderness Extremities: no peripheral edema, no calf or thigh tenderness. Has tenderness, induration and erythema surrounding an old IV site left medial forearm. Wound: the sternotomy incision and CT sites are intact and dry without any signs of infection. The sternum is stable. The right groin incision is well approximated with skin staples and is without inflammation or drainage.   Lab Results: Recent Labs    01/19/24 0419 01/20/24 0609  WBC 19.3* 19.8*  HGB 10.4* 9.5*  HCT 31.1* 28.8*  PLT 461* 553*   BMET:  Recent Labs    01/18/24 1214 01/19/24 0822  NA 134* 136  K 3.4* 3.2*  CL 99 97*  CO2 25 26  GLUCOSE 114* 113*  BUN 9 10  CREATININE 0.92 0.79  CALCIUM  8.2* 8.0*    PT/INR: No results for input(s): LABPROT, INR in the last 72 hours. ABG    Component Value Date/Time   PHART 7.482 (H)  01/10/2024 0438   HCO3 29.7 (H) 01/10/2024 0438   TCO2 31 01/10/2024 0438   ACIDBASEDEF 1.0 01/09/2024 0709   O2SAT 95 01/10/2024 0438   CBG (last 3)  No results for input(s): GLUCAP in the last 72 hours.  Meds Scheduled Meds:  amiodarone   200 mg Oral BID   Followed by   NOREEN ON 01/24/2024] amiodarone   200 mg Oral Daily   aspirin  EC  325 mg Oral Daily   dapagliflozin  propanediol  10 mg Oral Daily   furosemide   40 mg Oral Daily   hydrochlorothiazide   12.5 mg Oral Daily   losartan   25 mg Oral Daily   magnesium  oxide  400 mg Oral Daily   metoprolol  succinate  50 mg Oral Daily   potassium chloride   40 mEq Oral Daily   rosuvastatin   40 mg Oral Daily   Continuous Infusions:  ceFEPime  (MAXIPIME ) IV 2 g (01/20/24 0603)   vancomycin  1,000 mg (01/20/24 0440)   PRN Meds:.acetaminophen  **OR** acetaminophen , alum & mag hydroxide-simeth, guaiFENesin -dextromethorphan , melatonin, Muscle Rub, mouth rinse  Xrays VAS US  UPPER EXTREMITY VENOUS DUPLEX Result Date: 01/19/2024 UPPER VENOUS STUDY  Patient Name:  Jeremy [name]  Date of Exam:   01/18/2024 Medical Rec #: [id number]             Accession #:    [id number]  Date of Birth: 22-Sep-1980             Patient Gender: M Patient Age:   43 years Exam Location:  [facility] Procedure:      VAS US  UPPER EXTREMITY VENOUS DUPLEX Referring Phys: DELILIAH RASHID --------------------------------------------------------------------------------  Indications: Focal Pain, Swelling, and Erythema in left forearm at prior IV site. Comparison Study: No prior study Performing Technologist: Alberta Lis RVS  Examination Guidelines: A complete evaluation includes B-mode imaging, spectral Doppler, color Doppler, and power Doppler as needed of all accessible portions of each vessel. Bilateral testing is considered an integral part of a complete examination. Limited examinations for reoccurring indications may be performed as noted.  Right Findings:  +----------+------------+---------+-----------+----------+-------+ RIGHT     CompressiblePhasicitySpontaneousPropertiesSummary +----------+------------+---------+-----------+----------+-------+ Subclavian               Yes       Yes                      +----------+------------+---------+-----------+----------+-------+  Left Findings: +----------+------------+---------+-----------+----------+---------------------+ LEFT      CompressiblePhasicitySpontaneousProperties       Summary        +----------+------------+---------+-----------+----------+---------------------+ IJV           Full       Yes       Yes                                    +----------+------------+---------+-----------+----------+---------------------+ Subclavian               Yes       Yes                                    +----------+------------+---------+-----------+----------+---------------------+ Axillary                 Yes       Yes                                    +----------+------------+---------+-----------+----------+---------------------+ Brachial      Full       Yes       Yes                                    +----------+------------+---------+-----------+----------+---------------------+ Radial        Full                                                        +----------+------------+---------+-----------+----------+---------------------+ Ulnar         Full                                                        +----------+------------+---------+-----------+----------+---------------------+ Cephalic      Full                                                        +----------+------------+---------+-----------+----------+---------------------+  Basilic       None                                   Acute in forearm at                                                        site of prior IV     +----------+------------+---------+-----------+----------+---------------------+  Summary:  Right: No evidence of thrombosis in the subclavian.  Left: No evidence of deep vein thrombosis in the upper extremity. Findings consistent with acute superficial vein thrombosis involving the left basilic vein at site of knotting in forearm at prior IV site, patent throughout the rest of the extremity.  *See table(s) above for measurements and observations.  Diagnosing physician: Gaile New MD Electronically signed by Gaile New MD on 01/19/2024 at 2:27:17 PM.    Final    VAS US  LOWER EXTREMITY ARTERIAL DUPLEX Result Date: 01/19/2024 LOWER EXTREMITY ARTERIAL DUPLEX STUDY Patient Name:  Jeremy [name]  Date of Exam:   01/18/2024 Medical Rec #: [id number]             Accession #:    [id number] Date of Birth: 13-Oct-1980             Patient Gender: M Patient Age:   15 years Exam Location:  [facility] Procedure:      VAS US  LOWER EXTREMITY ARTERIAL DUPLEX Referring Phys: DELILIAH RASHID --------------------------------------------------------------------------------  Indications: Pain and swelling right thigh. 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 Complicated course with 2 reoperations and coagulopathy,              with revision and addition CABG X1.  Current ABI: N/A Limitations: Staples Comparison Study: No prior study on file Performing Technologist: Alberta Lis RVS  Examination Guidelines: A complete evaluation includes B-mode imaging, spectral Doppler, color Doppler, and power Doppler as needed of all accessible portions of each vessel. Bilateral testing is considered an integral part of a complete examination. Limited examinations for reoccurring indications may be performed as noted.   Summary: Right: No pseudoaneurysm noted. Hematoma noted in proximal thigh running the course of staples harvest site.  See table(s)  above for measurements and observations. Electronically signed by Gaile New MD on 01/19/2024 at 2:27:00 PM.    Final     Assessment/Plan:   -POD11 valve-sparing aortic root aneurysm repair with subsequent revision and single-vessel bypass to RCA for bleeding. Pt left the hospital AMA from ICU in the early morning of POD5. Readmission 2 days later with fever 101.2 and concern for right groin swelling. Admission CXR showed tiny bilateral effusion, no significant infiltrates, echo showed a small pericardial effusion and EF 30-35%.   -ID: Tmax 99.3, WBC 23.5->19.3->19.8.  Blood Cx's remain negative at day 3. Ruled out fluid collection and pseudoaneurysm right groin with arterial duplex scan. Has superficial phlebitis left medial arm at old IV site that should be well covered with current ABX.   -Appreciate assistance from the hospitalists and cardiology with evaluation and management. CT surgery will continue  to follow.    LOS: 3 days    Laurel JUDITHANN Becket PA-C Pager [phone] 01/20/2024  Patient seen and examined.  Findings as noted above.  Still some concern with white blood cell count remaining elevated.  He is on the antibiotics for phlebitis of his left arm and groin wounds.  Elspeth MOTE Kerrin, MD [facility] [phone]

## 2024-01-20 NOTE — Discharge Summary (Signed)
 Physician Discharge Summary   Patient: Jeremy [name] MRN: [id number] DOB: 05-09-1981  Admit date:     01/16/2024  Discharge date: 01/20/24  Discharge Physician: Deliliah Room   PCP: Delbert Clam, MD   Recommendations at discharge:    Follow up with PCP in one week. Follow up with cardiothoracic surgery and cardiology Continue taking meds as prescribed Return to ED if you develop fever, chest pain or shortness of breath.  Discharge Diagnoses: Active Problems:   NICM (nonischemic cardiomyopathy) (HCC)   S/P ascending aortic replacement   Elevated troponin   Fever    Hospital Course: He was recently admitted 6/25-7/2 with acute aortic syndrome. He underwent valve sparing repair of his ascending thoracic aorta. This was complicated by continued bleeding requiring re-exploration of his mediastinum twice. He ultimately underwent revision of the aortic root graft and CABG x 1 with S-RCA. Upon stabilization and transfer out to progressive care, the patient left AMA, now presented with concern for right groin swelling, ruled out pseudoaneursym.   Troponinemia,POA: elevated but flat. Likely post operative state. No chest pain. Cardiology on board. 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 Required takeback x 2 for revision of aortic root graft, CABG x 1 on 6/26. He required significant blood product resuscitation for coagulopathy.  On 6/27,He was taken back to the OR for Re-exploration of mediastinum, extracorporeal circulation, coronary artery bypass grafting x1 with saphenous vein graft to the right coronary at the end of procedure.  Out patient f/u with CTS.     Chronic Heart failure with reduced ejection fraction,POA: NYHA III. Non-ischemic cardiomyopathy in the setting of cocaine abuse S/p Cath in  2015 which didn't show any significant blockages. I reviewed the ECHO which showed EF 30-35%, regional wall motion abnormalities and severe LVH. On GDMT with Metoprolol  succinate and SGLT2i Continue with losartan  Consideration will be given to start him on MRA as an outpatient if BP and kidney function allows. Fluid restriction, Heart healthy diet and daily weight F/u with cardiology     Fever with leukocytosis: Could be postoperative fever. Afebrile throughout the hospitalization. Cultures obtained and started on empiric antibiotics. No growth on blood cultures. May be postoperative pericarditis and left arm superficial thrombophlebitis. Ruled out pseudoaneurysm of right groin by arterial duplex. Dced on oral doxycyline.   Hypertension: Continue to monitor. On losartan  and metoprolol  succinate.   Normocytic Anemia: likely from postoperative blood loss.  Has required transfusion last week.  Follow CBC. Aspirin  dose reduced to 81 mg as per cardiologist's recommendation.   Right groin swelling: Presence of hematoma seen on arterial duplex. No evidence of pseudoaneurysm. Right groin staples removed before discharge.       Consultants: Cardiology, CTS Procedures performed: None  Disposition: Home Diet recommendation:  Cardiac diet DISCHARGE MEDICATION: Allergies as of 01/20/2024       Reactions   Penicillins Hives   Principen [ampicillin] Hives        Medication List     STOP taking these medications    amLODipine  10 MG tablet Commonly known as: NORVASC    indomethacin 25 MG capsule Commonly known as: INDOCIN   lisinopril -hydrochlorothiazide  20-12.5 MG tablet Commonly known as: ZESTORETIC    magnesium  oxide 400 MG tablet Commonly known as: MAG-OX   metoprolol  tartrate 50 MG tablet Commonly known as: LOPRESSOR    potassium  chloride SA 20 MEQ tablet Commonly known as: KLOR-CON  M       TAKE these medications    amiodarone  200 MG tablet Commonly known as:  PACERONE  Take 2 tablets (400 mg total) by mouth daily. X 5 days, then decrease 200 mg (1 tab) BID x 7 days, then decrease to 200 mg. Stop after one month What changed: additional instructions   aspirin  EC 81 MG tablet Take 1 tablet (81 mg total) by mouth daily. Swallow whole.   dapagliflozin  propanediol 10 MG Tabs tablet Commonly known as: FARXIGA  Take 1 tablet (10 mg total) by mouth daily. Start taking on: January 21, 2024   doxycycline  100 MG tablet Commonly known as: VIBRA -TABS Take 1 tablet (100 mg total) by mouth 2 (two) times daily.   furosemide  40 MG tablet Commonly known as: Lasix  Take 1 tablet (40 mg total) by mouth daily.   losartan  25 MG tablet Commonly known as: COZAAR  Take 1 tablet (25 mg total) by mouth daily. Start taking on: January 21, 2024   magnesium  oxide 400 (240 Mg) MG tablet Commonly known as: MAG-OX Take 1 tablet (400 mg total) by mouth daily. Start taking on: January 21, 2024   melatonin 5 MG Tabs Take 1 tablet (5 mg total) by mouth at bedtime as needed.   metoprolol  succinate 50 MG 24 hr tablet Commonly known as: TOPROL -XL Take 1 tablet (50 mg total) by mouth daily. Take with or immediately following a meal. Start taking on: January 21, 2024   Potassium Chloride  ER 20 MEQ Tbcr Take 1 tablet (20 mEq total) by mouth daily. Start taking on: January 21, 2024   rosuvastatin  40 MG tablet Commonly known as: Crestor  Take 1 tablet (40 mg total) by mouth daily.        Follow-up Information     Kerrin Elspeth BROCKS, MD. Go on 02/17/2024.   Specialty: Cardiothoracic Surgery Why: Your appointment is at 10am.  Please arrive 1 hour before the appointment for a chest X-ray to be done on the second floor of the same building. Contact information: [address] [place] [address] [phone]         Delbert Clam, MD. Call in 1 week(s).   Specialty: Family Medicine Contact information: [address] [place] [address] [place]  [address] [phone]         Lavona Agent, MD. Call.   Specialty: Cardiology Contact information: [address] [place] [address] [phone]                Discharge Exam: Jeremy [name]   01/16/24 1706 01/17/24 0244  Weight: 117.9 kg 106.1 kg   General exam: Appears calm and comfortable  Respiratory system: Clear to auscultation. Respiratory effort normal. Cardiovascular system: Mid line CABG scar,no erythema or discharge Gastrointestinal system: Abdomen is nondistended, soft and nontender. No organomegaly or masses felt. Normal bowel sounds heard. Central nervous system: Alert and oriented. No focal neurological deficits. Extremities: Symmetric 5 x 5 power. Skin: No rashes, lesions or ulcers, right inguinal swelling Psychiatry: Judgement and insight appear normal. Mood & affect appropriate.  Condition at discharge: good  The results of significant diagnostics from this hospitalization (including imaging, microbiology, ancillary and laboratory) are listed below for reference.   Imaging Studies: VAS US  UPPER EXTREMITY VENOUS DUPLEX Result Date: 01/19/2024 UPPER VENOUS STUDY  Patient Name:  Jeremy [name]  Date of Exam:   01/18/2024 Medical Rec #: [id number]  Accession #:    [id number] Date of Birth: [birth year]             Patient Gender: M Patient Age:   34 years Exam Location:  [facility] Procedure:      VAS US  UPPER EXTREMITY VENOUS DUPLEX Referring Phys: DELILIAH Marda Breidenbach --------------------------------------------------------------------------------  Indications: Focal Pain, Swelling, and Erythema in left forearm at prior IV site. Comparison Study: No prior study Performing Technologist: Alberta Lis RVS  Examination Guidelines: A complete evaluation includes B-mode imaging, spectral Doppler, color Doppler, and power Doppler as needed of all accessible portions of each vessel. Bilateral testing is considered an integral part of a complete  examination. Limited examinations for reoccurring indications may be performed as noted.  Right Findings: +----------+------------+---------+-----------+----------+-------+ RIGHT     CompressiblePhasicitySpontaneousPropertiesSummary +----------+------------+---------+-----------+----------+-------+ Subclavian               Yes       Yes                      +----------+------------+---------+-----------+----------+-------+  Left Findings: +----------+------------+---------+-----------+----------+---------------------+ LEFT      CompressiblePhasicitySpontaneousProperties       Summary        +----------+------------+---------+-----------+----------+---------------------+ IJV           Full       Yes       Yes                                    +----------+------------+---------+-----------+----------+---------------------+ Subclavian               Yes       Yes                                    +----------+------------+---------+-----------+----------+---------------------+ Axillary                 Yes       Yes                                    +----------+------------+---------+-----------+----------+---------------------+ Brachial      Full       Yes       Yes                                    +----------+------------+---------+-----------+----------+---------------------+ Radial        Full                                                        +----------+------------+---------+-----------+----------+---------------------+ Ulnar         Full                                                        +----------+------------+---------+-----------+----------+---------------------+ Cephalic      Full                                                        +----------+------------+---------+-----------+----------+---------------------+  Basilic       None                                   Acute in forearm at                                                         site of prior IV    +----------+------------+---------+-----------+----------+---------------------+  Summary:  Right: No evidence of thrombosis in the subclavian.  Left: No evidence of deep vein thrombosis in the upper extremity. Findings consistent with acute superficial vein thrombosis involving the left basilic vein at site of knotting in forearm at prior IV site, patent throughout the rest of the extremity.  *See table(s) above for measurements and observations.  Diagnosing physician: Gaile New MD Electronically signed by Gaile New MD on 01/19/2024 at 2:27:17 PM.    Final    VAS US  LOWER EXTREMITY ARTERIAL DUPLEX Result Date: 01/19/2024 LOWER EXTREMITY ARTERIAL DUPLEX STUDY Patient Name:  Jeremy [name]  Date of Exam:   01/18/2024 Medical Rec #: [id number]             Accession #:    [id number] Date of Birth: 1980/07/22             Patient Gender: M Patient Age:   58 years Exam Location:  [facility] Procedure:      VAS US  LOWER EXTREMITY ARTERIAL DUPLEX Referring Phys: DELILIAH Jissel Slavens --------------------------------------------------------------------------------  Indications: Pain and swelling right thigh. 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 Complicated course with 2 reoperations and coagulopathy,              with revision and addition CABG X1.  Current ABI: N/A Limitations: Staples Comparison Study: No prior study on file Performing Technologist: Alberta Lis RVS  Examination Guidelines: A complete evaluation includes B-mode imaging, spectral Doppler, color Doppler, and power Doppler as needed of all accessible portions of each vessel. Bilateral testing is considered an integral part of a complete examination. Limited examinations for reoccurring indications may be performed as noted.   Summary: Right: No pseudoaneurysm noted. Hematoma noted in proximal thigh running the  course of staples harvest site.  See table(s) above for measurements and observations. Electronically signed by Gaile New MD on 01/19/2024 at 2:27:00 PM.    Final    DG CHEST PORT 1 VIEW Result Date: 01/17/2024 CLINICAL DATA:  Shortness of breath EXAM: PORTABLE CHEST 1 VIEW COMPARISON:  01/16/2024 FINDINGS: Cardiomegaly. Prior CABG. No confluent opacities, effusions or edema. No acute bony abnormality. IMPRESSION: Cardiomegaly.  No active disease. Electronically Signed   By: Franky Crease M.D.   On: 01/17/2024 23:30   ECHOCARDIOGRAM COMPLETE Result Date: 01/17/2024    ECHOCARDIOGRAM REPORT   Patient Name:   Jeremy [name] Date of Exam: 01/17/2024 Medical Rec #:  [id number]            Height:       72.0 in Accession #:    [id number]           Weight:  233.9 lb Date of Birth:  04/03/1981            BSA:          2.277 m Patient Age:    42 years             BP:           116/88 mmHg Patient Gender: M                    HR:           103 bpm. Exam Location:  Inpatient Procedure: 2D Echo, Cardiac Doppler and Color Doppler (Both Spectral and Color            Flow Doppler were utilized during procedure). Indications:    Chest Pain R07.9  History:        Patient has prior history of Echocardiogram examinations, most                 recent 05/16/2014. Cardiomyopathy and CHF; Risk                 Factors:Hypertension and Current Smoker.  Sonographer:    Thea Norlander RCS Referring Phys: REDIA LOISE CLEAVER IMPRESSIONS  1. Left ventricular ejection fraction, by estimation, is 30 to 35%. The left ventricle has moderately decreased function. The left ventricle demonstrates regional wall motion abnormalities (see scoring diagram/findings for description). The left ventricular internal cavity size was moderately dilated. There is severe left ventricular hypertrophy. Left ventricular diastolic parameters are indeterminate.  2. Right ventricular systolic function is moderately reduced. The right ventricular size is  normal. Tricuspid regurgitation signal is inadequate for assessing PA pressure.  3. A small pericardial effusion is present.  4. The mitral valve is normal in structure. Trivial mitral valve regurgitation.  5. The aortic valve was not well visualized. Aortic valve regurgitation is mild. Moderate aortic valve stenosis. Vmax 2.6 m/s, MG , DI 0.30  6. Aortic root/ascending aorta has been repaired/replaced. Aortic root is thickened  7. The inferior vena cava is normal in size with greater than 50% respiratory variability, suggesting right atrial pressure of 3 mmHg. FINDINGS  Left Ventricle: Left ventricular ejection fraction, by estimation, is 30 to 35%. The left ventricle has moderately decreased function. The left ventricle demonstrates regional wall motion abnormalities. The left ventricular internal cavity size was moderately dilated. There is severe left ventricular hypertrophy. Left ventricular diastolic parameters are indeterminate.  LV Wall Scoring: The mid and distal anterior wall, entire septum, mid and distal inferior wall, and apex are hypokinetic. The entire lateral wall, basal anterior segment, and basal inferior segment are normal. Right Ventricle: The right ventricular size is normal. No increase in right ventricular wall thickness. Right ventricular systolic function is moderately reduced. Tricuspid regurgitation signal is inadequate for assessing PA pressure. Left Atrium: Left atrial size was normal in size. Right Atrium: Right atrial size was normal in size. Pericardium: A small pericardial effusion is present. Mitral Valve: The mitral valve is normal in structure. Trivial mitral valve regurgitation. Tricuspid Valve: The tricuspid valve is normal in structure. Tricuspid valve regurgitation is trivial. Aortic Valve: The aortic valve was not well visualized. Aortic valve regurgitation is mild. Moderate aortic stenosis is present. Aortic valve mean gradient measures 14.5 mmHg. Aortic valve peak  gradient measures 27.6 mmHg. Aortic valve area, by VTI measures 1.36 cm. Pulmonic Valve: The pulmonic valve was not well visualized. Pulmonic valve regurgitation is trivial. Aorta: The aortic root and ascending aorta are  structurally normal, with no evidence of dilitation and the aortic root/ascending aorta has been repaired/replaced. Venous: The inferior vena cava is normal in size with greater than 50% respiratory variability, suggesting right atrial pressure of 3 mmHg. IAS/Shunts: The interatrial septum was not well visualized.  LEFT VENTRICLE PLAX 2D LVIDd:         6.40 cm   Diastology LVIDs:         4.70 cm   LV e' medial:    8.38 cm/s LV PW:         1.70 cm   LV E/e' medial:  7.4 LV IVS:        1.30 cm   LV e' lateral:   11.30 cm/s LVOT diam:     2.40 cm   LV E/e' lateral: 5.5 LV SV:         54 LV SV Index:   24 LVOT Area:     4.52 cm  RIGHT VENTRICLE            IVC RV S prime:     8.85 cm/s  IVC diam: 1.60 cm TAPSE (M-mode): 1.2 cm LEFT ATRIUM             Index        RIGHT ATRIUM           Index LA diam:        3.60 cm 1.58 cm/m   RA Area:     18.90 cm LA Vol (A2C):   50.5 ml 22.18 ml/m  RA Volume:   52.40 ml  23.01 ml/m LA Vol (A4C):   53.2 ml 23.36 ml/m LA Biplane Vol: 57.0 ml 25.03 ml/m  AORTIC VALVE AV Area (Vmax):    1.38 cm AV Area (Vmean):   1.39 cm AV Area (VTI):     1.36 cm AV Vmax:           262.50 cm/s AV Vmean:          177.000 cm/s AV VTI:            0.395 m AV Peak Grad:      27.6 mmHg AV Mean Grad:      14.5 mmHg LVOT Vmax:         79.80 cm/s LVOT Vmean:        54.500 cm/s LVOT VTI:          0.119 m LVOT/AV VTI ratio: 0.30  AORTA Ao Asc diam: 3.60 cm MITRAL VALVE MV Area (PHT): 4.96 cm    SHUNTS MV Decel Time: 153 msec    Systemic VTI:  0.12 m MV E velocity: 61.60 cm/s  Systemic Diam: 2.40 cm MV A velocity: 73.30 cm/s MV E/A ratio:  0.84 Lonni Nanas MD Electronically signed by Lonni Nanas MD Signature Date/Time: 01/17/2024/3:10:13 PM    Final    DG Chest Port 1  View Result Date: 01/16/2024 CLINICAL DATA:  Postop fever EXAM: PORTABLE CHEST 1 VIEW COMPARISON:  01/13/2024 FINDINGS: Post sternotomy changes. Interim removal of mediastinal drainage catheter and right IJ catheter sheath. Cardiomegaly with small pleural effusions. Improved aeration of the left lung base compared to prior. No visible pneumothorax IMPRESSION: Cardiomegaly with small pleural effusions. Improved aeration of the left lung base compared to prior. Electronically Signed   By: Luke Bun M.D.   On: 01/16/2024 18:49   DG CHEST PORT 1 VIEW Result Date: 01/13/2024 CLINICAL DATA:  Status post aortic dissection repair. EXAM: PORTABLE CHEST 1 VIEW COMPARISON:  01/11/2024  FINDINGS: The cardio pericardial silhouette is enlarged. There is pulmonary vascular congestion without overt pulmonary edema. Similar retrocardiac left base collapse/consolidation with probable left effusion. Pulmonary artery catheter has been removed in the interval. Right IJ sheath again noted. Midline thoracic drain persists. Telemetry leads overlie the chest. IMPRESSION: 1. Interval removal of pulmonary artery catheter. 2. Similar retrocardiac left base collapse/consolidation with probable left effusion. Electronically Signed   By: Camellia Candle M.D.   On: 01/13/2024 07:00   ECHO INTRAOPERATIVE TEE Result Date: 01/12/2024  *INTRAOPERATIVE TRANSESOPHAGEAL REPORT *  Patient Name:   Jeremy [name] Date of Exam: 01/08/2024 Medical Rec #:  [id number]            Height:       73.0 in Accession #:    [id number]           Weight:       236.8 lb Date of Birth:  June 09, 1981            BSA:          2.31 m Patient Age:    42 years             BP:           115/45 mmHg Patient Gender: M                    HR:           68 bpm. Exam Location:  Inpatient Transesophogeal exam was perform intraoperatively during surgical procedure. Patient was closely monitored under general anesthesia during the entirety of examination. Indications:     Aortic  Aneurysm Sonographer:     Koleen Popper RDCS Performing Phys: [id number] BROCKS HENDRICKSON Diagnosing Phys: Juliene Clinton MD Complications: No known complications during this procedure. POST-OP IMPRESSIONS _ Left Ventricle: LVEF is reduced relative to pre-op. EF is now 25-30%. It appears globally hypokinetic with no RWMA. _ Aortic Valve: The native AV remains in place. The insufficiency has been reduced to mild with the jet appearing to originate centrally and directed anteriorly hugging the LVOT wall. PRE-OP FINDINGS  Left Ventricle: The left ventricle has mild-moderately reduced systolic function, with an ejection fraction of 40-45%. The cavity size was mildly dilated. There is moderately increased left ventricular wall thickness. There is moderate concentric left ventricular hypertrophy. Right Ventricle: The right ventricle has normal systolic function. The cavity was normal. There is no increase in right ventricular wall thickness. Left Atrium: Left atrial size was normal in size. No left atrial/left atrial appendage thrombus was detected. Right Atrium: Right atrial size was normal in size. Interatrial Septum: No atrial level shunt detected by color flow Doppler. Pericardium: There is no evidence of pericardial effusion. Mitral Valve: The mitral valve is normal in structure. Mitral valve regurgitation is trivial by color flow Doppler. There is No evidence of mitral stenosis. Tricuspid Valve: The tricuspid valve was normal in structure. Tricuspid valve regurgitation was not visualized by color flow Doppler. No evidence of tricuspid stenosis is present. Aortic Valve: The aortic valve is tricuspid Aortic valve regurgitation is moderate by color flow Doppler. The jet is anteriorly-directed. There is no stenosis of the aortic valve. Pulmonic Valve: The pulmonic valve was normal in structure. Pulmonic valve regurgitation is not visualized by color flow Doppler. Aorta: The is normal in size and structure. There is an  aneurysm involving the ascending aorta measuring 66 mm. No dissection flap visualized in any part of the aorta. +------------+--------++ AORTIC VALVE         +------------+--------++  AR PHT:     601 msec +------------+--------++  +-------------+-------++ AORTA                +-------------+-------++ Ao Root diam:5.65 cm +-------------+-------++  Juliene Clinton MD Electronically signed by Juliene Clinton MD Signature Date/Time: 01/12/2024/10:05:03 AM    Final    DG Chest Port 1 View Result Date: 01/11/2024 CLINICAL DATA:  Status post ascending aortic aneurysm repair. EXAM: PORTABLE CHEST 1 VIEW COMPARISON:  01/09/2024 FINDINGS: Interval removal of ET tube and enteric tube. Pulmonary arterial catheter is identified with tip in the main pulmonary artery. Mediastinal drain is unchanged in position. Stable cardiac enlargement. Bilateral pleural effusions and pulmonary vascular congestion. Unchanged retrocardiac opacification in the left lower lung. IMPRESSION: 1. Interval removal of ET tube and enteric tube. 2. No change in pulmonary vascular congestion and bilateral pleural effusions. 3. Persistent retrocardiac opacification in the left lower lung. Likely reflecting underlying pleural effusion and atelectasis. Electronically Signed   By: Waddell Calk M.D.   On: 01/11/2024 07:40   DG Chest Port 1 View Result Date: 01/09/2024 EXAM: 1 VIEW XRAY OF THE CHEST 01/09/2024 08:17:00 AM COMPARISON: 1 view chest x-ray 01/08/2024. CLINICAL HISTORY: S/P ascending aortic replacement FINDINGS: LUNGS AND PLEURA: Lung volumes are low with small bilateral pleural effusions and mild edema. Interstitial edema and small effusions have increased slightly. No pneumothorax is present. HEART AND MEDIASTINUM: No acute abnormality of the cardiac and mediastinal silhouettes. BONES AND SOFT TISSUES: No acute osseous abnormality. LINES AND TUBES: The endotracheal tube is stable. The site of the gastric tube projects over the  stomach. The swan-ganz catheter is stable in the main pulmonary outflow tract. Mediastinal drain and right-sided chest tube are in place. IMPRESSION: 1. Stable endotracheal tube, gastric tube, and Swan-Ganz catheter. 2. Mediastinal drain and right-sided chest tube in place. 3. No pneumothorax. 4. Low lung volumes with small bilateral pleural effusions and mild interstitial edema, slightly increased compared to prior study. Electronically signed by: Lonni Necessary MD 01/09/2024 08:24 AM EDT RP Workstation: [id number]   DG Chest Port 1 View Result Date: 01/08/2024 CLINICAL DATA:  [id number] Foreign body (FB) in soft tissue [phone] OR unable to do count prior to surgery due to emergency Please call report to OR 14 at [phone] EXAM: PORTABLE CHEST 1 VIEW COMPARISON:  None Available. FINDINGS: Mediastinal drain. Right internal jugular approach Swan-Ganz catheter with tip overlying the main pulmonary artery. Endotracheal tube above the carina with exact positioning difficult to measure but likely in appropriate position. Enteric tube coursing below the hemidiaphragm with tip and side port overlying the expected region of the gastric lumen. Right chest tube overlying the right hemithorax. Epicardial pacing wires noted. Right axilla vascular clips. Multiple lines and tubes otherwise overlie the chest and abdomen. A 1.1 cm curvilinear density overlying the left neck overlying the posterior first rib. The heart and mediastinal contours are unchanged. No focal consolidation. No pulmonary edema. Possible trace pleural effusions. No pneumothorax. No acute osseous abnormality. IMPRESSION: 1. A 1.1 cm curvilinear density overlying the left neck at the level of the posterior first rib of unclear etiology. Finding is difficult to evaluate given overlying sheet. Finding could represent an needle likely external to the patient given location of surgery within the chest and not the neck. Please correlate with physical exam. 2.  Possible trace pleural effusions. 3. Lines and tubes as above. These results were called by telephone at the time of interpretation on 01/08/2024 at 10:45 pm to OR provider, who verbally acknowledged  these results. Electronically Signed   By: Morgane  Naveau M.D.   On: 01/08/2024 22:51   DG Chest Port 1 View Result Date: 01/08/2024 CLINICAL DATA:  Intubation. EXAM: PORTABLE CHEST 1 VIEW COMPARISON:  None Available. FINDINGS: Endotracheal tube with tip approximately 5.5 cm above the carina. Enteric tube extends below the diaphragm with tip beyond the inferior margin of the image. Right IJ Swan-Ganz catheter in similar position. Right mid to lower lung field atelectasis or infiltrate. No pneumothorax. Stable cardiomegaly. Median sternotomy wires. No acute osseous pathology. IMPRESSION: 1. Endotracheal tube with tip approximately 5.5 cm above the carina. 2. Right mid to lower lung field atelectasis or infiltrate. Electronically Signed   By: Vanetta Chou M.D.   On: 01/08/2024 19:32   DG Chest Port 1 View Result Date: 01/08/2024 CLINICAL DATA:  Instrument count.  Missing needle. EXAM: PORTABLE CHEST 1 VIEW COMPARISON:  Chest x-ray 01/07/2024.  Chest CT 01/07/2024. FINDINGS: -the tip overlies mid mediastinum. Swan-Ganz catheter tip overlies the expected location of the main pulmonary artery. Right pleural and mediastinal drains are present. Sternotomy wires are present. There surgical clips overlying the right axilla. Pacer wires are noted. No unexpected radiopaque foreign bodies identified. IMPRESSION: 1. No unexpected radiopaque foreign bodies identified. 2. Support apparatus as described above. 3. These results were called by telephone at the time of interpretation on 01/08/2024 at 5:11 pm to the operating room , who verbally acknowledged these results. Electronically Signed   By: Greig Pique M.D.   On: 01/08/2024 17:11   CT Angio Chest/Abd/Pel for Dissection W and/or Wo Contrast Result Date:  01/07/2024 CLINICAL DATA:  Acute aortic syndrome suspected. EXAM: CT ANGIOGRAPHY CHEST, ABDOMEN AND PELVIS TECHNIQUE: Noncontrast chest CT was performed. Multidetector CT imaging through the chest, abdomen and pelvis was performed using the standard protocol during bolus administration of intravenous contrast. Multiplanar reconstructed images and MIPs were obtained and reviewed to evaluate the vascular anatomy. RADIATION DOSE REDUCTION: This exam was performed according to the departmental dose-optimization program which includes automated exposure control, adjustment of the mA and/or kV according to patient size and/or use of iterative reconstruction technique. CONTRAST:  OMNIPAQUE  IOHEXOL  350 MG/ML SOLN COMPARISON:  01/07/2024 and 01/05/2021. FINDINGS: CTA CHEST FINDINGS Cardiovascular: Motion artifact limits evaluation. There is intramural hematoma involving the ascending aorta and aortic arch measuring up to 2 cm in thickness. There is ascending aortic aneurysm measuring 6.7 x 6.6 cm. Aortic arch and descending aorta appear normal in size. Origin of the great vessels appears within normal limits. The heart appears enlarged, particularly the left ventricle. There is no pericardial effusion. Mediastinum/Nodes: Posterior right thyroid nodule present measuring 18 mm. No enlarged lymph nodes identified. Esophagus within normal limits. Lungs/Pleura: Mild emphysema. There is a band of atelectasis in the left lower lobe. The lungs are otherwise clear. No pleural effusion or pneumothorax. Musculoskeletal: No chest wall abnormality. No acute or significant osseous findings. Review of the MIP images confirms the above findings. CTA ABDOMEN AND PELVIS FINDINGS VASCULAR Aorta: Normal caliber aorta without aneurysm, dissection, vasculitis or significant stenosis. There are minimal calcified plaques in the aorta. Celiac: Patent without evidence of aneurysm, dissection, vasculitis or significant stenosis. SMA: Patent  without evidence of aneurysm, dissection, vasculitis or significant stenosis. Renals: Both renal arteries are patent without evidence of aneurysm, dissection, vasculitis, fibromuscular dysplasia or significant stenosis. Accessory right renal artery present. IMA: Patent without evidence of aneurysm, dissection, vasculitis or significant stenosis. Inflow: There is a small focal dissection in the left common  iliac artery. No evidence for aneurysm. External and internal iliac arteries are within normal limits on the left. Right common iliac, external iliac and internal iliac arteries are within normal limits. Veins: No obvious venous abnormality within the limitations of this arterial phase study. Review of the MIP images confirms the above findings. NON-VASCULAR Hepatobiliary: Liver is mildly enlarged. No focal liver abnormality is seen. No gallstones, gallbladder wall thickening, or biliary dilatation. Pancreas: Unremarkable. No pancreatic ductal dilatation or surrounding inflammatory changes. Spleen: Normal in size without focal abnormality. Adrenals/Urinary Tract: There is an indeterminate left adrenal nodule measuring 2 0.4 x 2.0 cm. Right adrenal gland is within normal limits. Bilateral kidneys appear within normal limits. The bladder is within normal limits. Stomach/Bowel: Stomach is within normal limits. Appendix appears normal. No evidence of bowel wall thickening, distention, or inflammatory changes. Sigmoid and descending colon diverticulosis present. Lymphatic: No enlarged lymph nodes are seen. Reproductive: Prostate is unremarkable. Other: There is a small fat containing umbilical hernia. No abdominopelvic ascites. Musculoskeletal: No acute or significant osseous findings. Review of the MIP images confirms the above findings. IMPRESSION: 1. Ascending aortic aneurysm measuring 6.7 cm with intramural hematoma measuring up to 2 cm. Findings compatible with acute aortic syndrome. Recommend urgent cardiothoracic  surgery consultation. 2. 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 No evidence for aneurysm. 3. Cardiomegaly. 4. Mild hepatomegaly. 5. Indeterminate left adrenal nodule measuring 2 cm. Recommend nonemergent adrenal protocol CT or MRI. 6. Colonic diverticulosis. Aortic Atherosclerosis (ICD10-I70.0) and Emphysema (ICD10-J43.9). Electronically Signed   By: Greig Pique M.D.   On: 01/07/2024 17:11   CT Angio Chest PE W and/or Wo Contrast Result Date: 01/07/2024 CLINICAL DATA:  Shortness of breath. High probability for pulmonary embolism. EXAM: CT ANGIOGRAPHY CHEST WITH CONTRAST TECHNIQUE: Multidetector CT imaging of the chest was performed using the standard protocol during bolus administration of intravenous contrast. Multiplanar CT image reconstructions and MIPs were obtained to evaluate the vascular anatomy. RADIATION DOSE REDUCTION: This exam was performed according to the departmental dose-optimization program which includes automated exposure control, adjustment of the mA and/or kV according to patient size and/or use of iterative reconstruction technique. CONTRAST:  OMNIPAQUE  IOHEXOL  350 MG/ML SOLN COMPARISON:  CT angiogram chest abdomen and pelvis 01/05/2021. FINDINGS: Cardiovascular: Ascending aorta measures 6.1 x 6.2 cm and appears slightly increased in size when compared to the prior study. There is some indistinctness and haziness surrounding the ascending aorta which was not seen on the prior examination. The left ventricle is significantly dilated. The heart is mildly. No pericardial effusion. There is adequate opacification of the pulmonary arteries to the segmental level. There is no evidence for pulmonary embolism clear Mediastinum/Nodes: Mild emphysema present. There is atelectasis in the left lower lobe. The lungs are otherwise clear. There is no pleural effusion or pneumothorax. Lungs/Pleura: Lungs are clear. No pleural effusion or pneumothorax. Upper Abdomen: No acute  abnormality. Musculoskeletal: No chest wall abnormality. No acute or significant osseous findings. Review of the MIP images confirms the above findings. IMPRESSION: 1. No evidence for pulmonary embolism. 2. Ascending aortic aneurysm measuring 6.2 cm, increased in size when compared to the prior study. There is some indistinctness and haziness surrounding the ascending aorta which was not seen on the prior examination. Findings may be related to acute aortitis or other acute aortic pathology. Recommend further evaluation with CT angiogram chest dissection protocol and cardiothoracic surgery consultation. 3. Significant dilatation of the left ventricle. 4. Mild emphysema. Emphysema (ICD10-J43.9). These results were called by  telephone at the time of interpretation on 01/07/2024 at 4:25 pm to provider ADAM CURATOLO , who verbally acknowledged these results. Electronically Signed   By: Greig Pique M.D.   On: 01/07/2024 16:27   DG Chest 2 View Result Date: 01/07/2024 CLINICAL DATA:  Chest pain. EXAM: CHEST - 2 VIEW COMPARISON:  May 15, 2014 FINDINGS: The cardiac silhouette is mildly enlarged and unchanged in size. No acute infiltrate, pleural effusion or pneumothorax is identified. The visualized skeletal structures are unremarkable. IMPRESSION: Stable cardiomegaly without active cardiopulmonary disease. Electronically Signed   By: Suzen Dials M.D.   On: 01/07/2024 14:12    Microbiology: Results for orders placed or performed during the hospital encounter of 01/16/24  Culture, blood (Routine X 2) w Reflex to ID Panel     Status: None (Preliminary result)   Collection Time: 01/16/24  5:32 PM   Specimen: BLOOD LEFT FOREARM  Result Value Ref Range Status   Specimen Description   Final    BLOOD LEFT FOREARM Performed at [facility], [address], [place], [place] [address]    Special Requests   Final    BOTTLES DRAWN AEROBIC AND ANAEROBIC Blood Culture adequate  volume Performed at [facility], [address] [place], [place] [address]    Culture   Final    NO GROWTH 3 DAYS Performed at [facility], [address] [place], [place] [address]    Report Status PENDING  Incomplete  Culture, blood (Routine X 2) w Reflex to ID Panel     Status: None (Preliminary result)   Collection Time: 01/16/24  5:34 PM   Specimen: BLOOD LEFT WRIST  Result Value Ref Range Status   Specimen Description   Final    BLOOD LEFT WRIST Performed at [facility], [address] [place], [place] [address]    Special Requests   Final    BOTTLES DRAWN AEROBIC AND ANAEROBIC Blood Culture adequate volume Performed at [facility], [address], [place], [place] [address]    Culture   Final    NO GROWTH 3 DAYS Performed at [facility], [address] [place], [place] [address]    Report Status PENDING  Incomplete    Labs: CBC: Recent Labs  Lab 01/16/24 1810 01/17/24 0656 01/18/24 1214 01/19/24 0419 01/20/24 0609  WBC 18.9* 19.0* 23.5* 19.3* 19.8*  NEUTROABS 14.0* 14.8* 18.9* 14.5* 15.6*  HGB 10.5* 10.3* 10.6* 10.4* 9.5*  HCT 31.4* 30.3* 30.5* 31.1* 28.8*  MCV 88.0 88.3 87.6 88.1 89.2  PLT 363 353 430* 461* 553*   Basic Metabolic Panel: Recent Labs  Lab 01/16/24 1810 01/17/24 0656 01/18/24 1214 01/19/24 0822 01/20/24 1328  NA 136 135 134* 136 135  K 3.4* 3.2* 3.4* 3.2* 3.6  CL 98 100 99 97* 100  CO2 28 24 25 26 24   GLUCOSE 92 113* 114* 113* 135*  BUN 9 8 9 10 10   CREATININE 0.84 0.85 0.92 0.79 0.85  CALCIUM  8.7* 8.0* 8.2* 8.0* 8.4*   Liver Function Tests: Recent Labs  Lab 01/16/24 1810 01/17/24 0656  AST 99* 52*  ALT 42 32  ALKPHOS 75 50  BILITOT 0.9 1.2  PROT 7.3 6.6  ALBUMIN  3.5 2.7*   CBG: No results for input(s): GLUCAP in the last 168 hours.  Discharge time spent:  45 minutes.  Signed: Deliliah Room, MD [facility] 01/20/2024

## 2024-01-20 NOTE — Progress Notes (Signed)
 Discharge instructions (including medications) discussed with and copy provided to patient/caregiver

## 2024-01-21 ENCOUNTER — Telehealth: Payer: Self-pay

## 2024-01-21 ENCOUNTER — Inpatient Hospital Stay

## 2024-01-21 MED FILL — Heparin Sodium (Porcine) Inj 1000 Unit/ML: Qty: 1000 | Status: AC

## 2024-01-21 MED FILL — Potassium Chloride Inj 2 mEq/ML: INTRAVENOUS | Qty: 40 | Status: AC

## 2024-01-21 MED FILL — Lidocaine HCl Local Preservative Free (PF) Inj 2%: INTRAMUSCULAR | Qty: 14 | Status: AC

## 2024-01-21 NOTE — Transitions of Care (Post Inpatient/ED Visit) (Signed)
   01/21/2024  Name: Jeremy [name] MRN: [id number] DOB: July 18, 1980  Today's TOC FU Call Status: Today's TOC FU Call Status:: Successful TOC FU Call Completed TOC FU Call Complete Date: 01/21/24 Patient's Name and Date of Birth confirmed.  Transition Care Management Follow-up Telephone Call Date of Discharge: 01/20/24 Discharge Facility: [facility]) Type of Discharge: Inpatient Admission Primary Inpatient Discharge Diagnosis:: s/p ascending aortic replacement How have you been since you were released from the hospital?: Better Any questions or concerns?: No  Items Reviewed: Did you receive and understand the discharge instructions provided?: Yes Medications obtained,verified, and reconciled?: No Medications Not Reviewed Reasons:: Other: (He said he didn't want to review the med list. He stated he has all medications except the farxiga  because his insurance would not cover it because he doesn't have DM. I instructed him to contact his cardiologist about the farxiga  and he said he would.) Any new allergies since your discharge?: No Dietary orders reviewed?: Yes Type of Diet Ordered:: heart healthy, low sodium.  he said he is not on a fluid restriction Do you have support at home?: Yes People in Home [RPT]: parent(s) Name of Support/Comfort Primary Source: his mother  Medications Reviewed Today: Medications Reviewed Today   Medications were not reviewed in this encounter     Home Care and Equipment/Supplies: Were Home Health Services Ordered?: No Any new equipment or medical supplies ordered?: No  Functional Questionnaire: Do you need assistance with bathing/showering or dressing?: No Do you need assistance with meal preparation?: No Do you need assistance with eating?: No Do you have difficulty maintaining continence: No Do you need assistance with getting out of bed/getting out of a chair/moving?: No Do you have difficulty managing or taking your medications?: Yes  (His mother manages his meds and he said she tells him what to take and when and he has been following her instuctions.)  Follow up appointments reviewed: PCP Follow-up appointment confirmed?: Yes Follow-up Provider: Dr Delbert is listed as his PCP but he said she is not his PCP any longer. He now has Dr Alec as his PPC at  [facility].  He said he has an appointment with her but is not sure when. I instructed him to call her office and confirm the appointment as it is very important that he follows up with his PCP>. Specialist Hospital Follow-up appointment confirmed?: Yes Date of Specialist follow-up appointment?: 01/29/24 Follow-Up Specialty Provider:: Cardiology.   02/17/2024- CTS     He has a referral for cardiac rehab and he said he is anxious to get started.  I told him that he can always call cardiac rehab to schedule an appointment if he has not heard from them Do you need transportation to your follow-up appointment?: No Do you understand care options if your condition(s) worsen?: Yes-patient verbalized understanding    SIGNATURE Slater Diesel, RN

## 2024-01-22 LAB — CULTURE, BLOOD (ROUTINE X 2)
Culture: NO GROWTH
Culture: NO GROWTH
Special Requests: ADEQUATE
Special Requests: ADEQUATE

## 2024-01-22 MED FILL — Lidocaine HCl Local Preservative Free (PF) Inj 2%: INTRAMUSCULAR | Qty: 14 | Status: AC

## 2024-01-22 MED FILL — Potassium Chloride Inj 2 mEq/ML: INTRAVENOUS | Qty: 40 | Status: AC

## 2024-01-22 MED FILL — Heparin Sodium (Porcine) Inj 1000 Unit/ML: Qty: 1000 | Status: AC

## 2024-01-27 ENCOUNTER — Telehealth: Payer: Self-pay | Admitting: Student

## 2024-01-27 NOTE — Telephone Encounter (Signed)
 Trudy Birmingham, PA-C      01/27/24  1:58 PM Please call and check in with patient. If still having symptoms, definitely needs more emergent evaluation in the ED or by CT surgery prior to my appointment with him.   [place] Trudy, PA-C     Tried calling the pt back to endorse the above per [place] Trudy PA-C, and pt did not answer and voicemail not offered at this time.

## 2024-01-27 NOTE — Telephone Encounter (Signed)
 Patient called the after hour answering service with complaints of 8/10 left arm pain that he has had through out the night. States that he has taken tylenol  which helps briefly by pain returns. Denies the pain radiates. Denies any recent strenuous activity. Blood pressure was 123/77 on right arm. On chart review the patient had repair of an aortic aneurysm done on 01/08/24 and a revision done on 01/09/24. Because of this recent surgery recommended patient go to emergency department to get imaging of his surgical repair. Recommended patient get someone to drive him or that he call 911 and go by ambulance rather than drive himself. Will forward to [employer] as has a follow up appointment scheduled with him on 7/17.  Rithwik Schmieg PA-C

## 2024-01-27 NOTE — Telephone Encounter (Signed)
 Tried calling the pt with no success in connecting with him.  Will send this message to our triage team to follow-up with the pt tomorrow (Will be out of the office), to get a symptom update as requested by Artist Pouch PA-C.

## 2024-01-28 NOTE — Telephone Encounter (Signed)
 Spoke with pt regarding his prior symptoms. Pt stated he is no longer having any pain in his arm. Pt denies chest pain or shortness of breath. Pt plans to come in tomorrow for his appointment with Artist Pouch, PA-C. Pt was given ED precautions should he begin to have chest pain or arm pain or shortness of breath. Pt verbalized understanding. All questions if any were answered.

## 2024-01-29 ENCOUNTER — Encounter: Payer: Self-pay | Admitting: Cardiology

## 2024-01-29 ENCOUNTER — Ambulatory Visit: Attending: Cardiology | Admitting: Cardiology

## 2024-01-29 ENCOUNTER — Other Ambulatory Visit: Payer: Self-pay | Admitting: Physician Assistant

## 2024-01-29 VITALS — BP 124/72 | HR 99 | Ht 72.0 in | Wt 230.8 lb

## 2024-01-29 DIAGNOSIS — I7111 Aneurysm of the ascending aorta, ruptured: Secondary | ICD-10-CM | POA: Diagnosis present

## 2024-01-29 DIAGNOSIS — I428 Other cardiomyopathies: Secondary | ICD-10-CM | POA: Diagnosis present

## 2024-01-29 DIAGNOSIS — Z720 Tobacco use: Secondary | ICD-10-CM | POA: Insufficient documentation

## 2024-01-29 DIAGNOSIS — I10 Essential (primary) hypertension: Secondary | ICD-10-CM | POA: Diagnosis present

## 2024-01-29 MED ORDER — SPIRONOLACTONE 25 MG PO TABS: 12.5000 mg | ORAL_TABLET | Freq: Every day | ORAL | 3 refills | Status: AC

## 2024-01-29 NOTE — Progress Notes (Unsigned)
 Cardiology Office Note:   Date:  01/30/2024  ID:  Jeremy [name], DOB 1981-06-26, MRN [id number] PCP: No primary care provider on file.  [facility] Providers Cardiologist:  None    History of Present Illness:   Discussed the use of AI scribe software for clinical note transcription with the patient, who gave verbal consent to proceed.  History of Present Illness Jeremy [name] is a 43 year old male with hypertension and non-ischemic cardiomyopathy who presents for follow-up after recent aortic surgery.  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 He was discharged but returned to the emergency department on July 4th with fever and leukocytosis, and was readmitted after evaluation revealed an elevated troponin level.  Since his last hospital discharge, he feels better but experiences shortness of breath, particularly in his living room, which he attributes to the environment. No pain with deep breaths and no recent swelling in his legs or abdomen. He has noticed significant weight loss from 270 to 230 pounds.  He has a persistent hematoma in his right groin, which is numb but not increasing in size. He is concerned about the size and numbness of the hematoma in right groin. This was noted during second admission and patient had vascular imaging that did not reveal pseudoaneurysm. Overall patient notes decreasing size.  He reports intermittent left arm pain, described as nerve pain similar to a pinched nerve, which he associates with his arthritis. This pain can be alleviated by movement and sometimes worsens when sitting still.  He monitors his blood pressure at home, which ranges from 97/65 to 133/85, with an average around 115/75. No dizziness or lightheadedness.   Studies Reviewed:    EKG:         01/18/24 Vascular Ultrasound  Indications: Pain and swelling right thigh. Recent saphenous harvest for               emergent CABG/Aortic root aneurysm and type 1 aortic  intramural              hematoma. Complicated course with 2 reoperations and  coagulopathy,              with revision and addition CABG X1.     Current ABI: N/A   Limitations: Staples   Comparison Study: No prior study on file   Performing Technologist: Alberta Lis RVS     Examination Guidelines: A complete evaluation includes B-mode imaging,  spectral  Doppler, color Doppler, and power Doppler as needed of all accessible  portions  of each vessel. Bilateral testing is considered an integral part of a  complete  examination. Limited examinations for reoccurring indications may be  performed  as noted.    Summary:  Right: No pseudoaneurysm noted. Hematoma noted in proximal thigh running  the course of staples harvest site.   01/17/24 TTE  IMPRESSIONS     1. Left ventricular ejection fraction, by estimation, is 30 to 35%. The  left ventricle has moderately decreased function. The left ventricle  demonstrates regional wall motion abnormalities (see scoring  diagram/findings for description). The left  ventricular internal cavity size was moderately dilated. There is severe  left ventricular hypertrophy. Left ventricular diastolic parameters are  indeterminate.   2. Right ventricular systolic function is moderately reduced. The right  ventricular size is normal. Tricuspid regurgitation signal is inadequate  for assessing PA pressure.   3. A small pericardial effusion is present.   4. The mitral valve is normal in structure. Trivial mitral valve  regurgitation.   5. The aortic valve was not well visualized. Aortic valve regurgitation  is mild. Moderate aortic valve stenosis. Vmax 2.6 m/s, MG , DI 0.30   6. Aortic root/ascending aorta has been repaired/replaced. Aortic root is   thickened   7. The inferior vena cava is normal in size with greater than 50%  respiratory variability, suggesting right atrial pressure of 3 mmHg.   FINDINGS   Left Ventricle: Left ventricular ejection fraction, by estimation, is 30  to 35%. The left ventricle has moderately decreased function. The left  ventricle demonstrates regional wall motion abnormalities. The left  ventricular internal cavity size was  moderately dilated. There is severe left ventricular hypertrophy. Left  ventricular diastolic parameters are indeterminate.     LV Wall Scoring:  The mid and distal anterior wall, entire septum, mid and distal inferior  wall, and apex are hypokinetic. The entire lateral wall, basal anterior  segment, and basal inferior segment are normal.   Right Ventricle: The right ventricular size is normal. No increase in  right ventricular wall thickness. Right ventricular systolic function is  moderately reduced. Tricuspid regurgitation signal is inadequate for  assessing PA pressure.   Left Atrium: Left atrial size was normal in size.   Right Atrium: Right atrial size was normal in size.   Pericardium: A small pericardial effusion is present.   Mitral Valve: The mitral valve is normal in structure. Trivial mitral  valve regurgitation.   Tricuspid Valve: The tricuspid valve is normal in structure. Tricuspid  valve regurgitation is trivial.   Aortic Valve: The aortic valve was not well visualized. Aortic valve  regurgitation is mild. Moderate aortic stenosis is present. Aortic valve  mean gradient measures 14.5 mmHg. Aortic valve peak gradient measures 27.6  mmHg. Aortic valve area, by VTI  measures 1.36 cm.   Pulmonic Valve: The pulmonic valve was not well visualized. Pulmonic valve  regurgitation is trivial.   Aorta: The aortic root and ascending aorta are structurally normal, with  no evidence of dilitation and the aortic root/ascending aorta has been  repaired/replaced.    Venous: The inferior vena cava is normal in size with greater than 50%  respiratory variability, suggesting right atrial pressure of 3 mmHg.   IAS/Shunts: The interatrial septum was not well visualized.   Risk Assessment/Calculations:              Physical Exam:   VS:  BP 124/72   Pulse 99   Ht 6' (1.829 m)   Wt 230 lb 12.8 oz (104.7 kg)   SpO2 99%   BMI 31.30 kg/m    Wt Readings from Last 3 Encounters:  01/29/24 230 lb 12.8 oz (104.7 kg)  01/17/24 233 lb 14.5 oz (106.1 kg)  01/13/24 260 lb 1.6 oz (118 kg)     Physical Exam Vitals reviewed.  Constitutional:      Appearance: Normal appearance.  HENT:     Head: Normocephalic.  Eyes:     Pupils: Pupils are equal, round, and reactive to light.  Cardiovascular:     Rate and Rhythm: Normal rate and regular rhythm.     Pulses: Normal pulses.     Heart sounds: Normal heart sounds.  Pulmonary:     Effort: Pulmonary effort is normal.     Breath sounds: Normal breath sounds.  Abdominal:  General: Abdomen is flat.     Palpations: Abdomen is soft.  Musculoskeletal:     Right lower leg: No edema.     Left lower leg: No edema.  Skin:    General: Skin is warm and dry.     Capillary Refill: Capillary refill takes less than 2 seconds.     Comments: Firm hematoma in right groin. Superficial vein thrombosis on left forearm. Both decreasing in size per patient.  Neurological:     General: No focal deficit present.     Mental Status: He is alert and oriented to person, place, and time.  Psychiatric:        Mood and Affect: Mood normal.        Behavior: Behavior normal.        Thought Content: Thought content normal.        Judgment: Judgment normal.     ASSESSMENT AND PLAN:    Assessment & Plan Non-ischemic cardiomyopathy Non-ischemic cardiomyopathy with reduced ejection fraction (LVEF 30-35%) since 2015. Essentially unchanged on echo during recent admission. Inpatient adjustment of GDMT limited by BP. Improved BP  today. - Continue GDMT with current medications: Toprol  XL 50 mg, Lasix  40 mg, Losartan  25mg . Add Spironolactone  12.5mg  daily and stop potassium supplementation. - Patient unable to afford Farxiga , will reach out to pharmacy team about grant qualification. - Monitor blood pressure regularly and report if consistently below 100 mmHg systolic. - Order metabolic panel to check electrolytes and kidney function next week with MRA initiation.  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 - Follow up with cardiothoracic surgery team on August 5th. - Avoid heavy lifting and strenuous activities until cleared by surgery team.  Hematoma and superficial vein thrombosis Hematoma in the right groin area, stable in size with some numbness. No pseudoaneurysm noted on ultrasound. Slight reduction in size with heat application. Left forearm also with slowly improving superficial vein thrombosis per inpatient vascular imaging. - Continue applying heat to the groin hematoma and superficial veing thrombosis. - Monitor for changes in size or symptoms and report any worsening.   Hypertension Hypertension well-controlled with current regimen. Blood pressure readings range from 97/65 to 133/85, with most readings around 115/75. - Continue HF GDMT as above. - Monitor blood pressure at home and report any significant changes.  Post-operative arrhythmia prophylaxis Patient discharged after thoracic surgery on Amiodarone . Now tapering with plan to stop after 1 month of therapy.      Cardiac Rehabilitation Eligibility Assessment  The patient is ready to start cardiac rehabilitation pending clearance from the cardiac surgeon.        Signed, Artist Pouch, PA-C

## 2024-01-29 NOTE — Patient Instructions (Addendum)
 Medication Instructions:  Stop potassium supplement Start Spirolactone 12.5 mg once a day  *If you need a refill on your cardiac medications before your next appointment, please call your pharmacy*  Lab Work: We are going to order fasting labs in 6-8 weeks If you have labs (blood work) drawn today and your tests are completely normal, you will receive your results only by: MyChart Message (if you have MyChart) OR A paper copy in the mail If you have any lab test that is abnormal or we need to change your treatment, we will call you to review the results.  Testing/Procedures: No testing  Follow-Up: At [facility], you and your health needs are our priority.  As part of our continuing mission to provide you with exceptional heart care, our providers are all part of one team.  This team includes your primary Cardiologist (physician) and Advanced Practice Providers or APPs (Physician Assistants and Nurse Practitioners) who all work together to provide you with the care you need, when you need it.  Your next appointment:   3 month(s)  Provider:   Any APP  We recommend signing up for the patient portal called MyChart.  Sign up information is provided on this After Visit Summary.  MyChart is used to connect with patients for Virtual Visits (Telemedicine).  Patients are able to view lab/test results, encounter notes, upcoming appointments, etc.  Non-urgent messages can be sent to your provider as well.   To learn more about what you can do with MyChart, go to [email or web address].

## 2024-01-30 ENCOUNTER — Telehealth: Payer: Self-pay | Admitting: Cardiology

## 2024-01-30 ENCOUNTER — Other Ambulatory Visit (HOSPITAL_COMMUNITY): Payer: Self-pay

## 2024-01-30 NOTE — Telephone Encounter (Signed)
 Call placed to pt regarding message below.  Voicemail has not been set up, couldn't leave a message.  Will try later.

## 2024-01-30 NOTE — Telephone Encounter (Signed)
  During yesterday's visit, patient reported high Farxiga  cost preventing him from filling at pharmacy/taking. I spoke with our pharmD team and both Farxiga  and Jardiance should be $4 on his managed medicaid plan. Please call patient and advise of this. If he is agreeable to taking, would send Farxiga  10mg  to outpatient [facility] to ensure properly filled under his insurance coverage. Once he starts taking, recommend that he stop daily Lasix  and ONLY take as needed for weight gain greater than 3-4 pounds in 24 hours. No other medication changes.  Artist Pouch, PA-C

## 2024-02-06 ENCOUNTER — Telehealth: Payer: Self-pay | Admitting: Cardiology

## 2024-02-06 ENCOUNTER — Other Ambulatory Visit: Payer: Self-pay

## 2024-02-06 MED ORDER — FUROSEMIDE 40 MG PO TABS: 40.0000 mg | ORAL_TABLET | ORAL | Status: DC | PRN

## 2024-02-06 MED ORDER — FUROSEMIDE 40 MG PO TABS: 40.0000 mg | ORAL_TABLET | ORAL | 2 refills | Status: DC | PRN

## 2024-02-06 NOTE — Telephone Encounter (Signed)
  Patient states he was told to take Lasix  PRN. He states he is out. See that medication has been ended  on list. Patient would like a script sent to  [facility] [address] [place] - [address]   For when he does need to take it

## 2024-02-06 NOTE — Telephone Encounter (Signed)
 Spoke with pt, he advised once he went back to pharmacy, they had his Farxiga  filled and it was only $4.00.  Pt advised to reduce the Lasix  to prn for weight gain of 3-4 lbs in 24 hours.  Pt verbalized understanding.

## 2024-02-06 NOTE — Progress Notes (Signed)
 Order for Lasix  PRN sent to preferred pharmacy. Called pt, made aware prescription was sent in. Pt thanked me fro sending in the medication.

## 2024-02-06 NOTE — Telephone Encounter (Signed)
 Order for Lasix  PRN sent to preferred pharmacy. Called pt, made aware prescription was sent in. Pt thanked me fro sending in the medication.

## 2024-02-13 ENCOUNTER — Other Ambulatory Visit: Payer: Self-pay | Admitting: Thoracic Surgery (Cardiothoracic Vascular Surgery)

## 2024-02-13 DIAGNOSIS — I7410 Embolism and thrombosis of unspecified parts of aorta: Secondary | ICD-10-CM

## 2024-02-17 ENCOUNTER — Ambulatory Visit
Payer: Self-pay | Attending: Thoracic Surgery (Cardiothoracic Vascular Surgery) | Admitting: Thoracic Surgery (Cardiothoracic Vascular Surgery)

## 2024-02-17 ENCOUNTER — Encounter: Payer: Self-pay | Admitting: Thoracic Surgery (Cardiothoracic Vascular Surgery)

## 2024-02-17 ENCOUNTER — Ambulatory Visit (HOSPITAL_COMMUNITY)
Admission: RE | Admit: 2024-02-17 | Discharge: 2024-02-17 | Disposition: A | Discharge status: Home / Self Care | Source: Ambulatory Visit | Attending: Cardiology

## 2024-02-17 VITALS — BP 125/87 | HR 74 | Resp 18 | Ht 72.0 in | Wt 230.0 lb

## 2024-02-17 DIAGNOSIS — I7410 Embolism and thrombosis of unspecified parts of aorta: Secondary | ICD-10-CM | POA: Insufficient documentation

## 2024-02-17 DIAGNOSIS — I517 Cardiomegaly: Secondary | ICD-10-CM | POA: Insufficient documentation

## 2024-02-17 DIAGNOSIS — Z09 Encounter for follow-up examination after completed treatment for conditions other than malignant neoplasm: Secondary | ICD-10-CM

## 2024-02-17 DIAGNOSIS — Q2543 Congenital aneurysm of aorta: Secondary | ICD-10-CM

## 2024-02-17 NOTE — Progress Notes (Signed)
 [address], [name] [address]             [phone]       HPI: Mr. Jeremy [name] returns for a scheduled follow-up visit after repair of a type I aortic intramural hematoma and root aneurysm.  Jeremy [name] is a 43 year old man who presented with hypertensive crisis and acute chest pain.  CT showed an ascending and root aneurysm and an acute aortic intramural hematoma.  He underwent valve sparing root repair and replacement of the ascending aorta on 01/08/2024.  Postoperatively a coagulopathy and bleeding and required return to the OR twice in the first 24 hours.  Ultimately stop bleeding and then was doing extremely well.  He left AMA on 01/14/2024.  He presented back to the emergency room with fevers and swelling in his groin on 01/16/2024.  He was treated with antibiotics.  Discharged on 01/20/2024.  Since then he has been feeling well.  Is not having any significant pain.  Has not been taking narcotics.  Wants to return to work.  Swelling in groin has nearly completely resolved.  No fevers or chills.  Has been compliant with medications.  Past Medical History:  Diagnosis Date   Back pain    Hx of cardiac catheterization    a. 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 EF 35 to 40% by LHC on 05/15/14 likely due to hypertensive CM    Current Outpatient Medications  Medication Sig Dispense Refill   amiodarone  (PACERONE ) 200 MG tablet Take 2 tablets (400 mg total) by mouth daily. X 5 days, then decrease 200 mg (1 tab) BID x 7 days, then decrease to 200 mg. Stop after one month     aspirin  EC 81 MG tablet Take 1 tablet (81 mg total) by mouth daily. Swallow whole. 30 tablet 0   dapagliflozin  propanediol (FARXIGA ) 10 MG TABS tablet Take 1  tablet (10 mg total) by mouth daily. 30 tablet 0   doxycycline  (VIBRA -TABS) 100 MG tablet Take 1 tablet (100 mg total) by mouth 2 (two) times daily. 10 tablet 0   furosemide  (LASIX ) 40 MG tablet Take 1 tablet (40 mg total) by mouth as needed for fluid or edema. 30 tablet 2   losartan  (COZAAR ) 25 MG tablet Take 1 tablet (25 mg total) by mouth daily. 30 tablet 0   magnesium  oxide (MAG-OX) 400 (240 Mg) MG tablet Take 1 tablet (400 mg total) by mouth daily. 10 tablet 0   melatonin 5 MG TABS Take 1 tablet (5 mg total) by mouth at bedtime as needed. 5 tablet 0   metoprolol  succinate (TOPROL -XL) 50 MG 24 hr tablet Take 1 tablet (50 mg total) by mouth daily. Take with or immediately following a meal. 30 tablet 0   rosuvastatin  (CRESTOR ) 40 MG tablet Take 1 tablet (40 mg total) by mouth daily. 30 tablet 3   spironolactone  (ALDACTONE ) 25 MG tablet Take 0.5 tablets (12.5 mg total) by mouth daily. 45 tablet 3   No current facility-administered medications for this visit.    Physical Exam BP 125/87 (BP Location: Left Arm)   Pulse 74   Resp 18   Ht 6' (1.829 m)   Wt 230 lb (104.3 kg)   SpO2  96% Comment: RA  BMI 31.83 kg/m  43 year old man in no acute distress Alert and oriented x 3 with no focal deficits Lungs clear requested bilaterally Cardiac regular rate and rhythm, no murmur Sternum stable, incision intact.  Small pieces of suture removed. Right groin wound clean dry and intact No peripheral edema  Diagnostic Tests: I personally reviewed his chest x-ray images.  Postoperative changes.  No effusions or infiltrates.  Normal-appearing mediastinal silhouette.  Impression: Jeremy [name] is a 43 year old man with a history of hypertension and cocaine use.  Presented with hypertensive crisis and chest pain and was found to have a root and ascending aneurysm and acute aortic intramural hematoma.  Underwent repair of that on 01/08/2024.  Required return to the OR twice that evening, but after  that did extremely well.  Unfortunately he signed out AGAINST MEDICAL ADVICE on 01/14/2024.  He came back a couple days later with fever and swelling in the right groin.  That improved with antibiotics and the swelling is nearly completely resolved at this point.  He looks great.  Is not having any significant pain issues.  Incisions are healing well.  Advised him not to lift anything over 20 pounds until after Labor Day.  He is anxious to return to work.  That does not require any physical activity, so I am told him he can resume whenever he wants.  Emphasized the importance of compliance with medications and blood pressure control.  Also important to monitor blood pressure.  He asserts that he has been compliant.   Plan: Return in 3 months with CT angiogram chest  [name], MD [facility] [phone]

## 2024-02-19 ENCOUNTER — Telehealth: Payer: Self-pay | Admitting: Cardiology

## 2024-02-19 MED ORDER — METOPROLOL SUCCINATE ER 50 MG PO TB24: 50.0000 mg | ORAL_TABLET | Freq: Every day | ORAL | 3 refills | Status: AC

## 2024-02-19 MED ORDER — LOSARTAN POTASSIUM 25 MG PO TABS: 25.0000 mg | ORAL_TABLET | Freq: Every day | ORAL | 3 refills | Status: DC

## 2024-02-19 MED ORDER — ASPIRIN 81 MG PO TBEC: 81.0000 mg | DELAYED_RELEASE_TABLET | Freq: Every day | ORAL | 3 refills | Status: AC

## 2024-02-19 NOTE — Telephone Encounter (Signed)
*  STAT* If patient is at the pharmacy, call can be transferred to refill team.   1. Which medications need to be refilled? (please list name of each medication and dose if known)  aspirin  EC 81 MG tablet   losartan  (COZAAR ) 25 MG tablet  metoprolol  succinate (TOPROL -XL) 50 MG 24 hr tablet  2. Which pharmacy/location (including street and city if local pharmacy) is medication to be sent to? [facility] [address], [place] - [address] Phone: [phone]  Fax: [phone]     3. Do they need a 30 day or 90 day supply? 90

## 2024-02-19 NOTE — Telephone Encounter (Signed)
 Refills sent to pharmacy. Was seen by Artist Pouch, PA- C, has f/u appt with NP in October

## 2024-02-19 NOTE — Telephone Encounter (Signed)
 Pt has not seen a Cardiologist. Last seen by Artist Pouch PA. These RX's haven't been prescribed or refilled by Cardiology. Should we refill? Please advise.

## 2024-02-24 ENCOUNTER — Ambulatory Visit (HOSPITAL_COMMUNITY)
Admission: RE | Admit: 2024-02-24 | Discharge: 2024-02-24 | Disposition: A | Discharge status: Home / Self Care | Source: Ambulatory Visit | Attending: Cardiology | Admitting: Cardiology

## 2024-02-24 ENCOUNTER — Ambulatory Visit: Payer: Self-pay | Admitting: Cardiovascular Disease

## 2024-02-24 DIAGNOSIS — I5030 Unspecified diastolic (congestive) heart failure: Secondary | ICD-10-CM

## 2024-02-24 DIAGNOSIS — I7111 Aneurysm of the ascending aorta, ruptured: Secondary | ICD-10-CM | POA: Insufficient documentation

## 2024-02-24 DIAGNOSIS — I428 Other cardiomyopathies: Secondary | ICD-10-CM

## 2024-02-24 LAB — ECHOCARDIOGRAM COMPLETE
AR max vel: 1.28 cm2
AV Area VTI: 1.33 cm2
AV Area mean vel: 1.27 cm2
AV Mean grad: 11 mmHg
AV Peak grad: 19.4 mmHg
Ao pk vel: 2.2 m/s
Area-P 1/2: 2.36 cm2
P 1/2 time: 650 ms
S' Lateral: 5.35 cm

## 2024-02-26 ENCOUNTER — Telehealth: Payer: Self-pay | Admitting: Cardiovascular Disease

## 2024-02-26 DIAGNOSIS — I428 Other cardiomyopathies: Secondary | ICD-10-CM

## 2024-02-26 MED ORDER — DAPAGLIFLOZIN PROPANEDIOL 10 MG PO TABS: 10.0000 mg | ORAL_TABLET | Freq: Every day | ORAL | 0 refills | Status: DC

## 2024-02-26 NOTE — Telephone Encounter (Signed)
*  STAT* If patient is at the pharmacy, call can be transferred to refill team.   1. Which medications need to be refilled? (please list name of each medication and dose if known)   dapagliflozin  propanediol (FARXIGA ) 10 MG TABS tablet   2. Would you like to learn more about the convenience, safety, & potential cost savings by using the [facility]?   3. Are you open to using the [facility] (Type [facility]. ).  4. Which pharmacy/location (including street and city if local pharmacy) is medication to be sent to?  [facility] [address] [place] - [address]   5. Do they need a 30 day or 90 day supply?   90 day  Patient stated he has 1 tablet left.  Patient has appointment scheduled on 10/14 with K. Devora, NP.

## 2024-03-19 ENCOUNTER — Telehealth: Payer: Self-pay | Admitting: Physician Assistant

## 2024-03-19 NOTE — Telephone Encounter (Signed)
      [address] [place] [address]             [phone]    Mr. Keats called regarding back pain asking if he can restart his Indomethacin prescribed by his PCP that has been on hold since surgery. He is now over 10 weeks out from surgery. He reports this is his normal back pain that he has had for 10-15 years and is nothing new. I advised the patient he could restart the medication. I spoke with pharmacy regarding Indomethacin and ASA EC 81mg , they advised he space out when he takes the medications by a couple of hours and that it could slightly increase the patient's bleeding risk, especially GI bleed. The patient was advised of the risk of bleeding. He was also advised that if he develops new severe back pain or signs of bleeding to stop the medication and when to present to the emergency room. I also advised that he could reach out to his PCP for other medication options if he was not willing to take Indomethacin with increase in bleeding risk while on ASA 81mg . He was agreeable.   Con GORMAN Bend, PA-C 03/19/24

## 2024-03-26 ENCOUNTER — Telehealth: Payer: Self-pay | Admitting: Cardiovascular Disease

## 2024-03-26 NOTE — Telephone Encounter (Signed)
 Patient wants to get a prescription to get a COVID shot.

## 2024-03-28 NOTE — Telephone Encounter (Signed)
 Yes. Covid vaccination prescriptions are not something that our practice would do. I don't know current guidelines and completely out of my area of expertise. thx

## 2024-03-29 NOTE — Telephone Encounter (Signed)
 Call to patient to advise that Dr. Wonda is not writing covid vaccination scripts at this time, Patient verbalizes understanding to go to his PCP.

## 2024-03-31 ENCOUNTER — Other Ambulatory Visit: Payer: Self-pay | Admitting: Thoracic Surgery (Cardiothoracic Vascular Surgery)

## 2024-03-31 DIAGNOSIS — I7100 Dissection of unspecified site of aorta: Secondary | ICD-10-CM

## 2024-04-05 ENCOUNTER — Telehealth: Payer: Self-pay | Admitting: Cardiovascular Disease

## 2024-04-05 DIAGNOSIS — I428 Other cardiomyopathies: Secondary | ICD-10-CM

## 2024-04-05 MED ORDER — DAPAGLIFLOZIN PROPANEDIOL 10 MG PO TABS: 10.0000 mg | ORAL_TABLET | Freq: Every day | ORAL | 5 refills | Status: AC

## 2024-04-05 NOTE — Telephone Encounter (Signed)
*  STAT* If patient is at the pharmacy, call can be transferred to refill team.   1. Which medications need to be refilled? (please list name of each medication and dose if known) dapagliflozin  propanediol (FARXIGA ) 10 MG TABS tablet    2. Would you like to learn more about the convenience, safety, & potential cost savings by using the [facility]?     3. Are you open to using the [facility] (Type [facility].  ).   4. Which pharmacy/location (including street and city if local pharmacy) is medication to be sent to? [facility] [address] [place] - [address]    5. Do they need a 30 day or 90 day supply? 90 day

## 2024-04-05 NOTE — Telephone Encounter (Signed)
 Medication refill request completed. RX sent to [facility] pharmacy per pt's request.

## 2024-04-25 NOTE — Progress Notes (Unsigned)
 Cardiology Office Note    Date:  04/27/2024  ID:  Jeremy [name], DOB 12-Jul-1981, MRN [id number] PCP:  Patient, No Pcp Per  Cardiologist:  None  Electrophysiologist:  None   Chief Complaint: Follow up for HFrEF  History of Present Illness: .    Jeremy [name] is a 43 y.o. male with visit-pertinent history of HFrEF dating back to 2015 with a EF at that time 35 to 40%, thoracic aortic aneurysm with acute aortic syndrome s/p repair in 12/2023, hypertension, history of cocaine use.  Echocardiogram in 2015 indicated moderate LVH, EF 35 to 40%, diffuse HK, grade 1 diastolic dysfunction, trivial AI, mild to moderately dilated LAE, mild RVE, aortic root and ascending aorta normal in size.  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  LHC at that time indicated widely patent coronary arteries with minimal diffuse irregularity, mild to moderate segmental LV systolic dysfunction likely secondary to hypertensive cardiomyopathy, dilated ascending aorta, LVEF visualized at 45%.  Patient was lost to follow-up.  Patient was admitted from 01/07/2024 through 01/14/2024 with acute aortic syndrome.  He underwent valve sparing repair of his a ascending thoracic aorta this was complicated by continued bleeding requiring reexploration of his mediastinum twice.  He underwent minimally underwent revision of the aortic root graft and CABG x 1 with S-RCA.  Upon stabilization and transfer out of progressive care patient left AMA.  He returned to [facility] ED on 7//25 with right groin swelling and fevers.  His high sensitive troponins were to be elevated and he had anterior ST elevation inferior lateral ST depression on EKG.  It was felt that this was related to recent surgery.   Patient was last in clinic by cardiology service on 01/29/2024, he noted that he had been doing well overall, had noted  significant weight loss.  Noted to have a persistent hematoma to the right groin, reported it was decreasing in size.  Patient noted intermittent left arm pain described as nerve pain similar to a pinched nerve.  Echocardiogram on 02/24/2024 indicated LVEF 25 to 30%, LV with global hypokinesis, internal cavity size is mildly dilated, moderate concentric LVH, G1 DD, RV systolic function was mildly reduced, RV normal in size, mild aortic valve regurgitation with no evidence of stenosis.  As patient was on maximally tolerated GDMT without improvement is recommended that he be referred to heart failure clinic.  Today he reports that he has been doing well overall.  He denies any chest pain, shortness of breath, lower extremity edema, orthopnea or PND.  He denies any palpitations, presyncope or syncope.  Patient reports that he has regularly been monitoring his blood pressure at home, reports that it typically runs slightly low with systolics at 110.  Patient reports that he has been walking 3 to 4 miles a day and tolerating very well.  Patient reports that his right groin hematoma has completely resolved, notes some slight numbness to the area.  Patient reports that he has been having some difficulties with his insurance, notes that he was to have a CT scan today however Medicaid denied coverage, he reports that Dr. Kerrin office is working on resolving this. ROS: .   Today he denies chest pain, shortness of breath, lower extremity edema, fatigue, palpitations, melena, hematuria, hemoptysis, diaphoresis, weakness, presyncope, syncope, orthopnea, and PND.  All other systems are reviewed and otherwise negative. Studies Reviewed: .  EKG:  EKG is ordered today, personally reviewed, demonstrating  EKG Interpretation Date/Time:  Tuesday April 27 2024 10:32:57 EDT Ventricular Rate:  77 PR Interval:  144 QRS Duration:  124 QT Interval:  412 QTC Calculation: 466 R Axis:   41  Text Interpretation: Normal  sinus rhythm Left ventricular hypertrophy with QRS widening and repolarization abnormality ( Sokolow-Lyon , Cornell product , Romhilt-Estes ) Inferior infarct (cited on or before 17-Jan-2024) No significant change since last tracing Confirmed by Hayly Litsey [phone]) on 04/27/2024 1:04:45 PM   CV Studies: Cardiac studies reviewed are outlined and summarized above. Otherwise please see EMR for full report. Cardiac Studies & Procedures   ______________________________________________________________________________________________     ECHOCARDIOGRAM  ECHOCARDIOGRAM COMPLETE 02/24/2024  Narrative ECHOCARDIOGRAM REPORT    Patient Name:   Jeremy [name] Date of Exam: 02/24/2024 Medical Rec #:  [id number]            Height:       72.0 in Accession #:    [id number]           Weight:       230.0 lb Date of Birth:  1980/11/30            BSA:          2.261 m Patient Age:    42 years             BP:           124/72 mmHg Patient Gender: M                    HR:           76 bpm. Exam Location:  [address]  Procedure: 2D Echo, Cardiac Doppler and Color Doppler (Both Spectral and Color Flow Doppler were utilized during procedure).  Indications:    I71 Ascending aortic aneurysm  History:        Patient has no prior history of Echocardiogram examinations. CHF and NICM, Prior CABG, Polysubstance abuse, Ascending aortic aneurysm (s/p rupture and repair); Risk Factors:Hypertension and Current Smoker.  Sonographer:    Elsie Bohr RDCS Referring Phys: [phone] MICHAEL COOPER  IMPRESSIONS   1. Left ventricular ejection fraction, by estimation, is 25 to 30%. The left ventricle has severely decreased function. The left ventricle demonstrates global hypokinesis. The left ventricular internal cavity size was mildly dilated. There is moderate concentric left ventricular hypertrophy. Left ventricular diastolic parameters are consistent with Grade I diastolic dysfunction (impaired relaxation). 2.  Right ventricular systolic function is mildly reduced. The right ventricular size is normal. Tricuspid regurgitation signal is inadequate for assessing PA pressure. 3. Left atrial size was mildly dilated. 4. The patient is s/p valve-spairing aortic root repair. The aortic valve is tricuspid. Aortic valve regurgitation is mild. Mild aortic valve stenosis. Aortic valve mean gradient measures 11.0 mmHg. 5. The mitral valve is normal in structure. No evidence of mitral valve regurgitation. No evidence of mitral stenosis. 6. S/p valve-sparing aortic root repair. 7. The inferior vena cava is normal in size with greater than 50% respiratory variability, suggesting right atrial pressure of 3 mmHg.  FINDINGS Left Ventricle: Left ventricular ejection fraction, by estimation, is 25 to 30%. The left ventricle has severely decreased function. The left ventricle demonstrates global hypokinesis. The left ventricular internal cavity size was mildly dilated. There is moderate concentric left ventricular hypertrophy. Left ventricular diastolic parameters are consistent with Grade I diastolic dysfunction (impaired relaxation).  Right Ventricle: The right ventricular size is normal.  No increase in right ventricular wall thickness. Right ventricular systolic function is mildly reduced. Tricuspid regurgitation signal is inadequate for assessing PA pressure.  Left Atrium: Left atrial size was mildly dilated.  Right Atrium: Right atrial size was normal in size.  Pericardium: There is no evidence of pericardial effusion.  Mitral Valve: The mitral valve is normal in structure. No evidence of mitral valve regurgitation. No evidence of mitral valve stenosis.  Tricuspid Valve: The tricuspid valve is normal in structure. Tricuspid valve regurgitation is trivial.  Aortic Valve: The patient is s/p valve-spairing aortic root repair. The aortic valve is tricuspid. Aortic valve regurgitation is mild. Aortic regurgitation PHT  measures 650 msec. Mild aortic stenosis is present. Aortic valve mean gradient measures 11.0 mmHg. Aortic valve peak gradient measures 19.4 mmHg. Aortic valve area, by VTI measures 1.33 cm.  Pulmonic Valve: The pulmonic valve was normal in structure. Pulmonic valve regurgitation is not visualized.  Aorta: S/p valve-sparing aortic root repair.  Venous: The inferior vena cava is normal in size with greater than 50% respiratory variability, suggesting right atrial pressure of 3 mmHg.  IAS/Shunts: No atrial level shunt detected by color flow Doppler.   LEFT VENTRICLE PLAX 2D LVIDd:         6.45 cm   Diastology LVIDs:         5.35 cm   LV e' medial:    7.62 cm/s LV PW:         1.60 cm   LV E/e' medial:  6.7 LV IVS:        1.60 cm   LV e' lateral:   10.40 cm/s LVOT diam:     2.40 cm   LV E/e' lateral: 4.9 LV SV:         53 LV SV Index:   24 LVOT Area:     4.52 cm   RIGHT VENTRICLE            IVC RV S prime:     7.51 cm/s  IVC diam: 1.30 cm TAPSE (M-mode): 1.1 cm  LEFT ATRIUM             Index        RIGHT ATRIUM           Index LA diam:        4.80 cm 2.12 cm/m   RA Pressure: 3.00 mmHg LA Vol (A2C):   86.5 ml 38.26 ml/m  RA Area:     14.80 cm LA Vol (A4C):   72.8 ml 32.20 ml/m  RA Volume:   38.90 ml  17.21 ml/m LA Biplane Vol: 79.9 ml 35.34 ml/m AORTIC VALVE AV Area (Vmax):    1.28 cm AV Area (Vmean):   1.27 cm AV Area (VTI):     1.33 cm AV Vmax:           220.00 cm/s AV Vmean:          149.000 cm/s AV VTI:            0.399 m AV Peak Grad:      19.4 mmHg AV Mean Grad:      11.0 mmHg LVOT Vmax:         62.10 cm/s LVOT Vmean:        41.900 cm/s LVOT VTI:          0.118 m LVOT/AV VTI ratio: 0.29 AI PHT:            650 msec  AORTA Ao Root diam:  4.70 cm Ao Asc diam:  3.20 cm  MITRAL VALVE               TRICUSPID VALVE MV Area (PHT): 2.36 cm    Estimated RAP:  3.00 mmHg MV Decel Time: 321 msec MV E velocity: 51.40 cm/s  SHUNTS MV A velocity: 53.50 cm/s   Systemic VTI:  0.12 m MV E/A ratio:  0.96        Systemic Diam: 2.40 cm  Dalton McleanMD Electronically signed by Ezra Kanner Signature Date/Time: 02/24/2024/4:42:49 PM    Final   TEE  ECHO INTRAOPERATIVE TEE 01/08/2024  Narrative *INTRAOPERATIVE TRANSESOPHAGEAL REPORT *    Patient Name:   Jeremy [name] Date of Exam: 01/08/2024 Medical Rec #:  [id number]            Height:       73.0 in Accession #:    [id number]           Weight:       236.8 lb Date of Birth:  [birth year]            BSA:          2.31 m Patient Age:    42 years             BP:           115/45 mmHg Patient Gender: M                    HR:           68 bpm. Exam Location:  Inpatient  Transesophogeal exam was perform intraoperatively during surgical procedure. Patient was closely monitored under general anesthesia during the entirety of examination.  Indications:     Aortic Aneurysm Sonographer:     Koleen Popper RDCS Performing Phys: [id number]ENDRICKSON Diagnosing Phys: Juliene Clinton MD  Complications: No known complications during this procedure. POST-OP IMPRESSIONS _ Left Ventricle: LVEF is reduced relative to pre-op. EF is now 25-30%. It appears globally hypokinetic with no RWMA. _ Aortic Valve: The native AV remains in place. The insufficiency has been reduced to mild with the jet appearing to originate centrally and directed anteriorly hugging the LVOT wall.  PRE-OP FINDINGS Left Ventricle: The left ventricle has mild-moderately reduced systolic function, with an ejection fraction of 40-45%. The cavity size was mildly dilated. There is moderately increased left ventricular wall thickness. There is moderate concentric left ventricular hypertrophy.   Right Ventricle: The right ventricle has normal systolic function. The cavity was normal. There is no increase in right ventricular wall thickness.  Left Atrium: Left atrial size was normal in size. No left atrial/left atrial appendage  thrombus was detected.  Right Atrium: Right atrial size was normal in size.  Interatrial Septum: No atrial level shunt detected by color flow Doppler.  Pericardium: There is no evidence of pericardial effusion.  Mitral Valve: The mitral valve is normal in structure. Mitral valve regurgitation is trivial by color flow Doppler. There is No evidence of mitral stenosis.  Tricuspid Valve: The tricuspid valve was normal in structure. Tricuspid valve regurgitation was not visualized by color flow Doppler. No evidence of tricuspid stenosis is present.  Aortic Valve: The aortic valve is tricuspid Aortic valve regurgitation is moderate by color flow Doppler. The jet is anteriorly-directed. There is no stenosis of the aortic valve.   Pulmonic Valve: The pulmonic valve was normal in structure. Pulmonic valve regurgitation is not visualized by color flow Doppler.  Aorta: The is normal in size and structure. There is an aneurysm involving the ascending aorta measuring 66 mm. No dissection flap visualized in any part of the aorta.  +------------+--------++ AORTIC VALVE         +------------+--------++ AR PHT:     601 msec +------------+--------++  +-------------+-------++ AORTA                +-------------+-------++ Ao Root diam:5.65 cm +-------------+-------++   Juliene Clinton MD Electronically signed by Juliene Clinton MD Signature Date/Time: 01/12/2024/10:05:03 AM    Final        ______________________________________________________________________________________________       Current Reported Medications:.    Current Meds  Medication Sig   amiodarone  (PACERONE ) 200 MG tablet Take 2 tablets (400 mg total) by mouth daily. X 5 days, then decrease 200 mg (1 tab) BID x 7 days, then decrease to 200 mg. Stop after one month   aspirin  EC 81 MG tablet Take 1 tablet (81 mg total) by mouth daily. Swallow whole.   dapagliflozin  propanediol (FARXIGA ) 10 MG TABS tablet  Take 1 tablet (10 mg total) by mouth daily.   furosemide  (LASIX ) 40 MG tablet Take 1 tablet (40 mg total) by mouth as needed for fluid or edema.   losartan  (COZAAR ) 25 MG tablet Take 1 tablet (25 mg total) by mouth daily.   metoprolol  succinate (TOPROL -XL) 50 MG 24 hr tablet Take 1 tablet (50 mg total) by mouth daily. Take with or immediately following a meal.   rosuvastatin  (CRESTOR ) 40 MG tablet Take 1 tablet (40 mg total) by mouth daily.   spironolactone  (ALDACTONE ) 25 MG tablet Take 0.5 tablets (12.5 mg total) by mouth daily.    Physical Exam:    VS:  BP 110/80   Pulse 77   Ht 6' 1 (1.854 m)   Wt 233 lb (105.7 kg)   SpO2 97%   BMI 30.74 kg/m    Wt Readings from Last 3 Encounters:  04/27/24 233 lb (105.7 kg)  02/17/24 230 lb (104.3 kg)  01/29/24 230 lb 12.8 oz (104.7 kg)    GEN: Well nourished, well developed in no acute distress NECK: No JVD; No carotid bruits CARDIAC: RRR, no murmurs, rubs, gallops RESPIRATORY:  Clear to auscultation without rales, wheezing or rhonchi  ABDOMEN: Soft, non-tender, non-distended EXTREMITIES:  No edema; No acute deformity     Asessement and Plan:.    HFrEF: Remote cath in 2015 with no CAD and nonischemic cardiomyopathy.  EF on intraoperative TEE postsurgery with a EF 25 to 30%. Echocardiogram on 02/24/2024 indicated LVEF 25 to 30%, LV with global hypokinesis, internal cavity size is mildly dilated, moderate concentric LVH, G1 DD, RV systolic function was mildly reduced, RV normal in size, mild aortic valve regurgitation with no evidence of stenosis.  Today patient reports adherence with all medications, reports that his blood pressure is very well-controlled.  Patient denies any shortness of breath, lower extremity edema, orthopnea or PND.  Patient reports that he takes Lasix  40 mg every other day.  Patient reports that he does not weigh himself daily, encourage patient to weigh daily and notify the office of weight gain of 2 to 3 pounds overnight or  5 pounds in a week, to monitor fluid intake and decrease sodium intake.  Patient notes that he was to be referred to heart failure clinic, has not received a phone call to schedule follow-up, will reach out to office for scheduling.  Patient's blood pressures limit escalation of GDMT. Continue  Toprol -XL 50 mg daily, Lasix  40 mg every other day, losartan  25 mg daily, spironolactone  12.5 mg daily and Farxiga  10 mg daily. Check CBC, BMET and BNP today.   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  Patient denies any chest pain, shortness of breath, lower extremity edema, or PND.  He reports that his blood pressure has been very well-controlled at home.  Patient being followed by CT surgery team, reports he was to have a CT of the chest today however his insurance has denied it, reports that Dr. Chrystal office is working on a solution.  He will follow-up with CT surgery team.  Hypertension: Blood pressure today 110/80.  Continue current antihypertensive regimen.  Tobacco use: Patient reports that he previously stopped smoking 2 years ago, congratulated.  Cocaine abuse: Patient reports long history of cocaine use, reports that he stopped use in June of this year following his hospitalization.  Patient denies any further, discussed need for complete cessation, patient verbalized understanding.  Disposition: F/u with Heart Failure Clinic.   Signed, Scottie Stanish D Kobie Whidby, NP

## 2024-04-27 ENCOUNTER — Ambulatory Visit (HOSPITAL_COMMUNITY): Attending: Cardiology | Admitting: Cardiology

## 2024-04-27 ENCOUNTER — Encounter: Payer: Self-pay | Admitting: Cardiology

## 2024-04-27 ENCOUNTER — Ambulatory Visit (HOSPITAL_COMMUNITY)

## 2024-04-27 VITALS — BP 110/80 | HR 77 | Ht 73.0 in | Wt 233.0 lb

## 2024-04-27 DIAGNOSIS — I10 Essential (primary) hypertension: Secondary | ICD-10-CM | POA: Diagnosis present

## 2024-04-27 DIAGNOSIS — I428 Other cardiomyopathies: Secondary | ICD-10-CM | POA: Insufficient documentation

## 2024-04-27 DIAGNOSIS — Z95828 Presence of other vascular implants and grafts: Secondary | ICD-10-CM | POA: Diagnosis present

## 2024-04-27 DIAGNOSIS — I5020 Unspecified systolic (congestive) heart failure: Secondary | ICD-10-CM | POA: Insufficient documentation

## 2024-04-27 DIAGNOSIS — I7111 Aneurysm of the ascending aorta, ruptured: Secondary | ICD-10-CM | POA: Diagnosis not present

## 2024-04-27 NOTE — Patient Instructions (Addendum)
 Medication Instructions:  Your physician recommends that you continue on your current medications as directed. Please refer to the Current Medication list given to you today.  *If you need a refill on your cardiac medications before your next appointment, please call your pharmacy*  Lab Work: Today- CBC, BMET, BNP If you have labs (blood work) drawn today and your tests are completely normal, you will receive your results only by: MyChart Message (if you have MyChart) OR A paper copy in the mail If you have any lab test that is abnormal or we need to change your treatment, we will call you to review the results.  Testing/Procedures: None ordered  Follow-Up: Please follow up with Heart Failure Clinic

## 2024-04-28 LAB — BASIC METABOLIC PANEL WITH GFR
BUN/Creatinine Ratio: 14 (ref 9–20)
BUN: 18 mg/dL (ref 6–24)
CO2: 22 mmol/L (ref 20–29)
Calcium: 8.9 mg/dL (ref 8.7–10.2)
Chloride: 103 mmol/L (ref 96–106)
Creatinine, Ser: 1.3 mg/dL — ABNORMAL HIGH (ref 0.76–1.27)
Glucose: 80 mg/dL (ref 70–99)
Potassium: 4.4 mmol/L (ref 3.5–5.2)
Sodium: 140 mmol/L (ref 134–144)
eGFR: 69 mL/min/1.73 (ref >59)

## 2024-04-28 LAB — BRAIN NATRIURETIC PEPTIDE: BNP: 51 pg/mL (ref 0.0–100.0)

## 2024-04-28 LAB — CBC
Hematocrit: 43.6 % (ref 37.5–51.0)
Hemoglobin: 13.8 g/dL (ref 13.0–17.7)
MCH: 27.5 pg (ref 26.6–33.0)
MCHC: 31.7 g/dL (ref 31.5–35.7)
MCV: 87 fL (ref 79–97)
Platelets: 286 x10E3/uL (ref 150–450)
RBC: 5.02 x10E6/uL (ref 4.14–5.80)
RDW: 16.8 % — ABNORMAL HIGH (ref 11.6–15.4)
WBC: 7.9 x10E3/uL (ref 3.4–10.8)

## 2024-04-29 ENCOUNTER — Ambulatory Visit: Payer: Self-pay | Admitting: Cardiology

## 2024-04-29 NOTE — Telephone Encounter (Signed)
 Spoke with pt regarding lab results. Pt verbalized understanding. Pt agreed to keep f/u appt with Dr Zenaida. Pt had no further questions.

## 2024-05-04 ENCOUNTER — Ambulatory Visit: Admitting: Thoracic Surgery (Cardiothoracic Vascular Surgery)

## 2024-05-10 ENCOUNTER — Other Ambulatory Visit: Payer: Self-pay | Admitting: Physician Assistant

## 2024-05-15 ENCOUNTER — Other Ambulatory Visit: Payer: Self-pay | Admitting: Physician Assistant

## 2024-05-24 ENCOUNTER — Telehealth: Admitting: Physician Assistant

## 2024-05-24 ENCOUNTER — Ambulatory Visit
Admission: RE | Admit: 2024-05-24 | Discharge: 2024-05-24 | Disposition: A | Discharge status: Home / Self Care | Source: Ambulatory Visit

## 2024-05-24 VITALS — BP 154/94 | HR 66 | Temp 98.7°F | Resp 18 | Wt 233.0 lb

## 2024-05-24 DIAGNOSIS — S39012A Strain of muscle, fascia and tendon of lower back, initial encounter: Secondary | ICD-10-CM

## 2024-05-24 DIAGNOSIS — R1032 Left lower quadrant pain: Secondary | ICD-10-CM

## 2024-05-24 DIAGNOSIS — R10A2 Flank pain, left side: Secondary | ICD-10-CM

## 2024-05-24 LAB — POCT URINE DIPSTICK
Bilirubin, UA: NEGATIVE
Glucose, UA: 500 mg/dL — AB
Ketones, POC UA: NEGATIVE mg/dL
Leukocytes, UA: NEGATIVE
Nitrite, UA: NEGATIVE
POC PROTEIN,UA: 30 — AB
Spec Grav, UA: 1.02 (ref 1.010–1.025)
Urobilinogen, UA: 0.2 U/dL
pH, UA: 6 (ref 5.0–8.0)

## 2024-05-24 MED ORDER — CYCLOBENZAPRINE HCL 5 MG PO TABS: 5.0000 mg | ORAL_TABLET | Freq: Three times a day (TID) | ORAL | 0 refills | Status: AC | PRN

## 2024-05-24 MED ORDER — DICLOFENAC SODIUM 75 MG PO TBEC: 75.0000 mg | DELAYED_RELEASE_TABLET | Freq: Two times a day (BID) | ORAL | 0 refills | Status: DC

## 2024-05-24 NOTE — Progress Notes (Signed)
 E-Visit for Urinary Problems  Based on what you shared with me, I feel your condition warrants further evaluation and I recommend that you be seen for a face to face office visit.  Male bladder infections are not very common.  We worry about prostate or kidney conditions.  The standard of care is to examine the abdomen and kidneys, and to do a urine and blood test to make sure that something more serious is not going on.  We recommend that you see a provider today.  If your doctor's office is closed [place] [facility] has the following Urgent Cares:   NOTE: There will be NO CHARGE for this E-Visit   If you are having a true medical emergency, please call 911.     For an urgent face to face visit, [facility] has multiple urgent care centers for your convenience.  Click the link below for the full list of locations and hours, walk-in wait times, appointment scheduling options and driving directions:  Urgent Care - [place], [place], [place], [place], [place], [place]       Your MyChart E-visit questionnaire answers were reviewed by a board certified advanced clinical practitioner to complete your personal care plan based on your specific symptoms.  Thank you for using e-Visits.

## 2024-05-24 NOTE — ED Provider Notes (Signed)
 [facility]    CSN: [id number] Arrival date & time: 05/24/24  1456      History   Chief Complaint Chief Complaint  Patient presents with   Groin Pain    And bad kidney pain - Entered by patient   Flank Pain    HPI Jeremy [name] is a 43 y.o. male.   Pt presents today because for the past 4 days he has been experiencing lower back pain that moves across his lower back. Pt states that he believes that it is his kidneys because he takes Lasix  every other day and it is causing him. Pt denies fever, dysuria, nausea, or vomiting.  Patient states that he has been experiencing hematuria and urinalysis for over a year.  Patient denies use of medication for pain.  The history is provided by the patient.  Groin Pain  Flank Pain    Past Medical History:  Diagnosis Date   Back pain    Hx of cardiac catheterization    a. 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   Tobacco abuse 07/02/2018   Hypertensive heart disease without heart failure 04/27/2015   Excess ear wax 04/27/2015   Hypertensive heart disease without congestive heart failure    NICM (nonischemic cardiomyopathy) (HCC)    Essential hypertension 11/12/2013   Routine health maintenance 10/29/2013    Past Surgical History:  Procedure Laterality Date   EXPLORATION POST  OPERATIVE OPEN HEART  01/08/2024   Procedure: EXPLORATION POST OPERATIVE OPEN HEART BLEEDING;  Surgeon: Kerrin Elspeth BROCKS, MD;  Location: [facility] OR;  Service: Vascular;;   EXPLORATION POST OPERATIVE OPEN HEART N/A 01/09/2024   Procedure: REVISION OF AORTIC ROOT GRAFT AND CORONARY ARTERY BYPASS GRAFT TIMES ONE;  Surgeon: Kerrin Elspeth BROCKS, MD;  Location: [facility] OR;  Service: Vascular;  Laterality: N/A;   INTRAOPERATIVE TRANSESOPHAGEAL ECHOCARDIOGRAM N/A 01/08/2024   Procedure: ECHOCARDIOGRAM, TRANSESOPHAGEAL, INTRAOPERATIVE;  Surgeon: Kerrin Elspeth BROCKS, MD;  Location: [facility] OR;  Service: Open Heart Surgery;  Laterality: N/A;   THORACIC AORTIC ANEURYSM REPAIR N/A 01/08/2024   Procedure: REPAIR, ANEURYSM, AORTA, THORACIC, ASCENDING USING GELWEAVE GRAFT;  Surgeon: Kerrin Elspeth BROCKS, MD;  Location: [facility] OR;  Service: Open Heart Surgery;  Laterality: N/A;  REPAIR TYPE 1 INTRAMURAL HEMATOMA       Home Medications    Prior to Admission medications   Medication Sig Start Date End Date Taking? Authorizing Provider  amiodarone  (PACERONE ) 200 MG tablet Take 2 tablets (400 mg total) by mouth daily. X 5 days, then decrease 200 mg (1 tab) BID x 7 days, then decrease to 200 mg. Stop after one month 01/20/24  Yes Rashid, Farhan, MD  cyclobenzaprine  (FLEXERIL ) 5 MG tablet Take 1 tablet (5 mg total) by mouth 3 (three) times daily as needed for muscle spasms. 05/24/24  Yes Andra Corean BROCKS, PA-C  dapagliflozin  propanediol (FARXIGA ) 10 MG TABS tablet Take 1 tablet (10 mg total) by mouth daily. 04/05/24  Yes Cooper, Michael, MD  diclofenac (VOLTAREN) 75 MG EC tablet Take 1 tablet (75 mg total) by mouth 2 (two) times daily. 05/24/24  Yes Andra Krabbe C, PA-C  doxycycline  (VIBRA -TABS) 100 MG tablet Take 1 tablet (100 mg total) by mouth 2 (two) times daily. 01/20/24  Yes Rashid, Farhan, MD  furosemide  (LASIX ) 40 MG tablet Take 1 tablet (40 mg total) by mouth as needed for fluid or edema. 02/06/24 02/05/25 Yes  Trudy Birmingham, PA-C  KLOR-CON  M20 20 MEQ tablet Take 20 mEq by mouth daily. 01/20/24  Yes [provider]  lisinopril -hydrochlorothiazide  (ZESTORETIC ) 20-12.5 MG tablet Oral 11/15/13  Yes [provider]  losartan  (COZAAR ) 25 MG tablet Take 1 tablet (25 mg total) by mouth daily. 02/19/24  Yes Williams, Evan, PA-C  melatonin 5 MG TABS Take 1 tablet (5 mg total) by mouth at bedtime as needed. 01/20/24  Yes Rashid, Farhan, MD  metoprolol  succinate (TOPROL -XL) 50 MG 24 hr tablet Take 1 tablet (50 mg total) by mouth daily. Take with or immediately following a meal. 02/19/24  Yes Trudy Birmingham, PA-C  rosuvastatin  (CRESTOR ) 40 MG tablet Take 1 tablet (40 mg total) by mouth daily. 01/14/24  Yes Barrett, Erin R, PA-C  amLODipine  (NORVASC ) 10 MG tablet Oral 11/15/13   [provider]  aspirin  EC 81 MG tablet Take 1 tablet (81 mg total) by mouth daily. Swallow whole. 02/19/24   Trudy Birmingham, PA-C  magnesium  oxide (MAG-OX) 400 (240 Mg) MG tablet Take 1 tablet (400 mg total) by mouth daily. Patient not taking: Reported on 04/27/2024 01/21/24   Rashid, Farhan, MD  spironolactone  (ALDACTONE ) 25 MG tablet Take 0.5 tablets (12.5 mg total) by mouth daily. 01/29/24 04/28/24  Trudy Birmingham, PA-C    Family History Family History  Problem Relation Age of Onset   Heart attack Mother    Cancer Mother    Heart failure Maternal Grandfather    Heart attack Maternal Grandfather    Cancer Maternal Grandmother    Stroke Neg Hx     Social History Social History   Tobacco Use   Smoking status: Former    Types: Cigarettes    Passive exposure: Current   Smokeless tobacco: Current  Vaping Use   Vaping status: Some Days   Substances: THC, Flavoring  Substance Use Topics   Alcohol use: Yes   Drug use: Not Currently    Types: Marijuana, Cocaine    Comment: No cocaine since early June 2025     Allergies   Penicillins and Principen [ampicillin]   Review of Systems Review of Systems   Genitourinary:  Positive for flank pain.     Physical Exam Triage Vital Signs ED Triage Vitals  Encounter Vitals Group     BP 05/24/24 1529 (!) 154/94     Girls Systolic BP Percentile --      Girls Diastolic BP Percentile --      Boys Systolic BP Percentile --      Boys Diastolic BP Percentile --      Pulse Rate 05/24/24 1529 66     Resp 05/24/24 1529 18     Temp 05/24/24 1529 98.7 F (37.1 C)     Temp Source 05/24/24 1529 Oral     SpO2 05/24/24 1529 97 %     Weight 05/24/24 1526 233 lb 0.4 oz (105.7 kg)     Height --  Head Circumference --      Peak Flow --      Pain Score 05/24/24 1526 6     Pain Loc --      Pain Education --      Exclude from Growth Chart --    No data found.  Updated Vital Signs BP (!) 154/94 (BP Location: Left Arm)   Pulse 66   Temp 98.7 F (37.1 C) (Oral)   Resp 18   Wt 233 lb 0.4 oz (105.7 kg)   SpO2 97%   BMI 30.74 kg/m   Visual Acuity Right Eye Distance:   Left Eye Distance:   Bilateral Distance:    Right Eye Near:   Left Eye Near:    Bilateral Near:     Physical Exam Vitals and nursing note reviewed.  Constitutional:      General: He is not in acute distress.    Appearance: Normal appearance. He is not ill-appearing, toxic-appearing or diaphoretic.  Eyes:     General: No scleral icterus. Cardiovascular:     Rate and Rhythm: Normal rate and regular rhythm.     Heart sounds: Normal heart sounds.  Pulmonary:     Effort: Pulmonary effort is normal. No respiratory distress.     Breath sounds: Normal breath sounds. No wheezing or rhonchi.  Abdominal:     General: Abdomen is flat. Bowel sounds are normal.     Palpations: Abdomen is soft.     Tenderness: There is no abdominal tenderness. There is no right CVA tenderness or left CVA tenderness.  Musculoskeletal:     Lumbar back: Normal range of motion.     Comments: No tenderness to palpation, pain elicited with active range of motion of lumbar spine  Skin:    General:  Skin is warm.  Neurological:     Mental Status: He is alert and oriented to person, place, and time.  Psychiatric:        Mood and Affect: Mood normal.        Behavior: Behavior normal.      UC Treatments / Results  Labs (all labs ordered are listed, but only abnormal results are displayed) Labs Reviewed  POCT URINE DIPSTICK - Abnormal; Notable for the following components:      Result Value   Glucose, UA =500 (*)    Blood, UA trace-intact (*)    POC PROTEIN,UA =30 (*)    All other components within normal limits    EKG   Radiology No results found.  Procedures Procedures (including critical care time)  Medications Ordered in UC Medications - No data to display  Initial Impression / Assessment and Plan / UC Course  I have reviewed the triage vital signs and the nursing notes.  Pertinent labs & imaging results that were available during my care of the patient were reviewed by me and considered in my medical decision making (see chart for details).     Final Clinical Impressions(s) / UC Diagnoses   Final diagnoses:  Low back strain, initial encounter     Discharge Instructions      Today you have been diagnosed with a musculoskeletal injury.  You should use ice on affected area for 20 minutes at a time a couple times a day for the first 24 hours then you may switch to heat in the same intervals.  Be sure to put a barrier between ice or heat source and skin to prevent burns.  May also wrap affected area and  Ace bandage if tolerated and appropriate, and elevate above the level of the heart to help reduce swelling.  Do not wrap Ace bandages around neck or torso as wrapping too tight can restrict air movement inability to breathe.  If symptoms do not seem to be improving in 3 to 5 days after following these instructions we need to follow-up with orthopedist or PCP.     ED Prescriptions     Medication Sig Dispense Auth. Provider   diclofenac (VOLTAREN) 75 MG EC  tablet Take 1 tablet (75 mg total) by mouth 2 (two) times daily. 30 tablet Andra Krabbe C, PA-C   cyclobenzaprine  (FLEXERIL ) 5 MG tablet Take 1 tablet (5 mg total) by mouth 3 (three) times daily as needed for muscle spasms. 30 tablet Andra Krabbe BROCKS, PA-C      PDMP not reviewed this encounter.   Andra Krabbe BROCKS, PA-C 05/24/24 1810

## 2024-05-24 NOTE — Discharge Instructions (Signed)

## 2024-05-24 NOTE — ED Triage Notes (Signed)
 Pt presents c/o L flank pain  x 4 days. Pt states,  for the past 4 days my kidneys been bothering me. I got an appt on the 17th but it hurts to bad to wait. It's like every time I take the Lasix  I use the bathroom a lot. I take it every other day. It's like tender right here (points to groin area).

## 2024-05-26 ENCOUNTER — Other Ambulatory Visit: Payer: Self-pay | Admitting: Thoracic Surgery (Cardiothoracic Vascular Surgery)

## 2024-05-26 DIAGNOSIS — I7100 Dissection of unspecified site of aorta: Secondary | ICD-10-CM

## 2024-05-29 NOTE — Progress Notes (Signed)
 [facility] NEW PATIENT CLINIC NOTE  Referring Physician: Trudy Birmingham, PA-C  Primary Care: Knute Thersia Bitters, FNP Primary Cardiologist:  HPI: Jeremy [name] is a 43 y.o. male with a PMH of HFrEF, TAA, prior cocaine use who presents for initial visit for further evaluation and treatment of heart failure/cardiomyopathy.      Echocardiogram in 2015 indicated moderate LVH, EF 35 to 40%, diffuse HK, grade 1 diastolic dysfunction, trivial AI, mild to moderately dilated LAE, mild RVE, aortic root and ascending aorta normal in size.  LHC at that time indicated widely patent coronary arteries with minimal diffuse irregularity, mild to moderate segmental LV systolic dysfunction likely secondary to hypertensive cardiomyopathy, dilated ascending aorta, LVEF visualized at 45%.   Patient was admitted from 01/07/2024 through 01/14/2024 with acute aortic syndrome. He underwent valve sparing repair of his a ascending thoracic aorta this was complicated by continued bleeding requiring reexploration of his mediastinum twice. Also with CABG x1 to the RCA. Left AMA during this admission.  EF did not improve so was referred to AHF clinic.      SUBJECTIVE:  Overall reports that he has been doing fair. BP has been much better controlled and he no longer is using cocaine at this point in time, is motivated to improve his health. Discussed his HF and minimal improved on GDMT, though not significantly far out from his surgery.    PMH, current medications, allergies, social history, and family history reviewed in epic.  PHYSICAL EXAM: Vitals:   05/31/24 1540  BP: 120/78  Pulse: 64  SpO2: 97%   GENERAL: Well nourished and in no apparent distress at rest.  PULM:  Normal work of breathing, clear to auscultation bilaterally. Respirations are unlabored.  CARDIAC:  JVP: flat         Normal rate with regular rhythm. No murmurs, rubs or gallops.  No edema. Warm and well perfused  extremities. ABDOMEN: Soft, non-tender, non-distended. NEUROLOGIC: Patient is oriented x3 with no focal or lateralizing neurologic deficits.    DATA REVIEW  ECG: 04/2024: NSR, LVH, qrs widening, no LBBB    ECHO: 05/2014: LV moderately dilated, LVEF 35-40%, diastolic dysfunction 01/2024: LVEF 30-35%, moderately dilated, severe LVH 02/24/2024: LVEF 25-30%, moderate LVH, RV mildly reduced, grade I DD  CATH: None    ASSESSMENT & PLAN:  Chronic systolic heart failure: Long standing history, presumed NICM, recent CABG likely more related to bleeding during post op. Suspect due to hypertension but given LVH and chronicity, reasonable to rule out infiltrative disease.  - CMR ordered for scar burden and assess for etiology - NYHA class II, euvolemic, has never had volume symptoms - Continue farxiga  10mg  daily - Continue spironolactone  12.5mg  daily - Increase losartan  to 50mg  daily - Stop amlodipine  and hydrochlorothiazide  - Continue metoprolol  succinate 50mg  daily - Briefly discussed ICD - Recent bump in creatinine to 1.3, K 4.4. 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 Pending repeat CT of the chest and CT surgery follow up. - Follow up with CT surgery - Discussed importance of BP control and substance avoidance  CKD: - Stage IIIa, previously normal - repeat at next visit  Hypertension:  - Better controlled in clinic, changes as above  Tobacco use: Prior - Congratulated  Cocaine abuse:  - Stopped in June after admission, stressed importance  Follow up in 2-3 months  I spent 62 minutes caring for this patient today  including face to face time, ordering and reviewing labs, reviewing records from prior workup, recent hospital stay, discussing ICD, seeing the patient, documenting in the record, and arranging follow ups.   Morene Brownie, MD [facility] Mechanical Circulatory  Support 06/04/24

## 2024-05-31 ENCOUNTER — Ambulatory Visit (HOSPITAL_COMMUNITY)
Admission: RE | Admit: 2024-05-31 | Discharge: 2024-05-31 | Disposition: A | Discharge status: Home / Self Care | Source: Ambulatory Visit | Attending: Cardiology | Admitting: Cardiology

## 2024-05-31 ENCOUNTER — Encounter (HOSPITAL_COMMUNITY): Payer: Self-pay | Admitting: Cardiology

## 2024-05-31 VITALS — BP 120/78 | HR 64 | Wt 242.0 lb

## 2024-05-31 DIAGNOSIS — I7111 Aneurysm of the ascending aorta, ruptured: Secondary | ICD-10-CM

## 2024-05-31 DIAGNOSIS — I428 Other cardiomyopathies: Secondary | ICD-10-CM | POA: Diagnosis not present

## 2024-05-31 DIAGNOSIS — I71019 Dissection of thoracic aorta, unspecified: Secondary | ICD-10-CM | POA: Diagnosis not present

## 2024-05-31 DIAGNOSIS — I10 Essential (primary) hypertension: Secondary | ICD-10-CM

## 2024-05-31 DIAGNOSIS — I5020 Unspecified systolic (congestive) heart failure: Secondary | ICD-10-CM

## 2024-05-31 MED ORDER — LOSARTAN POTASSIUM 50 MG PO TABS
50.0000 mg | ORAL_TABLET | Freq: Every day | ORAL | 3 refills | Status: AC
Start: 2024-05-31 — End: ?

## 2024-05-31 NOTE — Patient Instructions (Signed)
 STOP Amlodipine   STOP lisinopril -hydrochlorothiazide   CHANGE Losartan  to 50 mg daily.  Your physician has requested that you have a cardiac MRI. Cardiac MRI uses a computer to create images of your heart as its beating, producing both still and moving pictures of your heart and major blood vessels. For further information please visit [email or web address]. Please follow the instruction sheet given to you today for more information.   Your physician recommends that you schedule a follow-up appointment in: 3 months ( February 2026) ** PLEASE CALL THE OFFICE IN DECEMBER TO ARRANGE YOUR FOLLOW UP APPOINTMENT.**  If you have any questions or concerns before your next appointment please send us  a message through [place] or call our office at [phone].    TO LEAVE A MESSAGE FOR THE NURSE SELECT OPTION 2, PLEASE LEAVE A MESSAGE INCLUDING: YOUR NAME DATE OF BIRTH CALL BACK NUMBER REASON FOR CALL**this is important as we prioritize the call backs  YOU WILL RECEIVE A CALL BACK THE SAME DAY AS LONG AS YOU CALL BEFORE 4:00 PM  At the [facility], you and your health needs are our priority. As part of our continuing mission to provide you with exceptional heart care, we have created designated Provider Care Teams. These Care Teams include your primary Cardiologist (physician) and Advanced Practice Providers (APPs- Physician Assistants and Nurse Practitioners) who all work together to provide you with the care you need, when you need it.   You may see any of the following providers on your designated Care Team at your next follow up: Dr Toribio Fuel Dr Ezra Shuck Dr. Morene Brownie Greig Mosses, NP Caffie Shed, [place] [facility] [place], [place] Beckey Coe, NP Jordan Lee, NP Ellouise Class, NP Tinnie Redman, PharmD Jaun Bash, PharmD   Please be sure to bring in all your medications bottles to every appointment.    Thank you for choosing [facility]

## 2024-06-01 ENCOUNTER — Ambulatory Visit (INDEPENDENT_AMBULATORY_CARE_PROVIDER_SITE_OTHER)

## 2024-06-01 ENCOUNTER — Ambulatory Visit (HOSPITAL_BASED_OUTPATIENT_CLINIC_OR_DEPARTMENT_OTHER): Payer: Self-pay | Admitting: Family Medicine

## 2024-06-01 ENCOUNTER — Encounter (HOSPITAL_BASED_OUTPATIENT_CLINIC_OR_DEPARTMENT_OTHER): Payer: Self-pay | Admitting: Family Medicine

## 2024-06-01 ENCOUNTER — Ambulatory Visit (INDEPENDENT_AMBULATORY_CARE_PROVIDER_SITE_OTHER): Admitting: Family Medicine

## 2024-06-01 VITALS — BP 130/86 | HR 64 | Ht 73.0 in | Wt 239.0 lb

## 2024-06-01 DIAGNOSIS — Z7689 Persons encountering health services in other specified circumstances: Secondary | ICD-10-CM

## 2024-06-01 DIAGNOSIS — Z1211 Encounter for screening for malignant neoplasm of colon: Secondary | ICD-10-CM

## 2024-06-01 DIAGNOSIS — I428 Other cardiomyopathies: Secondary | ICD-10-CM | POA: Diagnosis not present

## 2024-06-01 DIAGNOSIS — M21931 Unspecified acquired deformity of right forearm: Secondary | ICD-10-CM

## 2024-06-01 DIAGNOSIS — Z95828 Presence of other vascular implants and grafts: Secondary | ICD-10-CM

## 2024-06-01 DIAGNOSIS — L739 Follicular disorder, unspecified: Secondary | ICD-10-CM

## 2024-06-01 MED ORDER — DOXYCYCLINE HYCLATE 100 MG PO TABS: 100.0000 mg | ORAL_TABLET | Freq: Two times a day (BID) | ORAL | 0 refills | Status: AC

## 2024-06-01 MED ORDER — ROSUVASTATIN CALCIUM 40 MG PO TABS: 40.0000 mg | ORAL_TABLET | Freq: Every day | ORAL | 2 refills | Status: AC

## 2024-06-01 MED ORDER — MUPIROCIN 2 % EX OINT: TOPICAL_OINTMENT | CUTANEOUS | 3 refills | Status: AC

## 2024-06-01 NOTE — Progress Notes (Signed)
 New Patient Office Visit  Subjective:   Jeremy [name] [birth year] 06/01/2024  Chief Complaint  Patient presents with   New Patient (Initial Visit)    Patient is here today to get established with the practice. Has a knot on right hand that he wants to have looked at and also states his chest is breaking out.    Discussed the use of AI scribe software for clinical note transcription with the patient, who gave verbal consent to proceed.  History of Present Illness Jeremy [name] is a 43 year old male who presents with concerns about a knot on his hand and skin issues on his chest.  He noticed a hard, bony protrusion on his hand approximately two weeks ago, which becomes more prominent with movement but is not painful to touch. A similar knot has recently appeared on the opposite hand. He is right-handed.  He has skin issues on his chest that began about two weeks ago, described as 'breaking up' and itchy. There have been no recent changes in creams or lotions used. He has a history of cystic acne but notes this presentation is different, with pustules on his chest. The skin issues began about a month after discontinuing doxycycline , which he was taking post-surgery to prevent infection. He reports itching associated with the chest skin issues.  His current medications include rosuvastatin , for which he is seeking a refill. He was previously on doxycycline  for a surgical procedure, which he completed.  Patient has extensive hx of HFrEF dating back to 2015 and hx thoracic aortic aneurysm with acute aortic syndrome s/p repair in 12/2023, HTN, and history of cocaine abuse which he stopped following hospitalization in June 2025. He is followed by Vascular and Cardiology with upcoming CTA and Cardiac MRI next week. Reports tolerating medications well, BP well controlled, and exercising regularly.   The following portions of the patient's history were reviewed and updated as  appropriate: past medical history, past surgical history, family history, social history, allergies, medications, and problem list.   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 EF 35 to 40% by LHC on 05/15/14 likely due to hypertensive CM   Past Surgical History:  Procedure Laterality Date   EXPLORATION POST OPERATIVE OPEN HEART  01/08/2024   Procedure: EXPLORATION POST OPERATIVE OPEN HEART BLEEDING;  Surgeon: Kerrin Elspeth BROCKS, MD;  Location: [facility] OR;  Service: Vascular;;   EXPLORATION POST OPERATIVE OPEN HEART N/A 01/09/2024   Procedure: REVISION OF AORTIC ROOT GRAFT AND CORONARY ARTERY BYPASS  GRAFT TIMES ONE;  Surgeon: Kerrin Elspeth BROCKS, MD;  Location: [facility] OR;  Service: Vascular;  Laterality: N/A;   INTRAOPERATIVE TRANSESOPHAGEAL ECHOCARDIOGRAM N/A 01/08/2024   Procedure: ECHOCARDIOGRAM, TRANSESOPHAGEAL, INTRAOPERATIVE;  Surgeon: Kerrin Elspeth BROCKS, MD;  Location: [facility] OR;  Service: Open Heart Surgery;  Laterality: N/A;   THORACIC  AORTIC ANEURYSM REPAIR N/A 01/08/2024   Procedure: REPAIR, ANEURYSM, AORTA, THORACIC, ASCENDING USING GELWEAVE GRAFT;  Surgeon: Kerrin Elspeth BROCKS, MD;  Location: [facility] OR;  Service: Open Heart Surgery;  Laterality: N/A;  REPAIR TYPE 1 INTRAMURAL HEMATOMA   Family History  Problem Relation Age of Onset   Heart attack Mother    Cancer Mother    Heart failure Maternal Grandfather    Heart attack Maternal Grandfather    Cancer Maternal Grandmother    Stroke Neg Hx    Social History   Socioeconomic History   Marital status: Single    Spouse name: Not on file   Number of children: Not on file   Years of education: Not on file   Highest education level: Not on file  Occupational History   Not on file  Tobacco Use   Smoking status: Former    Types: Cigarettes    Passive exposure: Current   Smokeless tobacco: Current  Vaping Use   Vaping status: Some Days   Substances: THC, Flavoring  Substance and Sexual Activity   Alcohol use: Yes   Drug use: Not Currently    Types: Marijuana, Cocaine    Comment: No cocaine since early June 2025   Sexual activity: Not on file  Other Topics Concern   Not on file  Social History Narrative   Not on file   Social Drivers of Health   Financial Resource Strain: Not on file  Food Insecurity: No Food Insecurity (01/17/2024)   Hunger Vital Sign    Worried About Running Out of Food in the Last Year: Never true    Ran Out of Food in the Last Year: Never true  Transportation Needs: No Transportation Needs (01/17/2024)   PRAPARE - Administrator, Civil Service (Medical): No    Lack of Transportation (Non-Medical): No  Physical Activity: Not on file  Stress: Not on file  Social Connections: Not on file  Intimate Partner Violence: Not At Risk (01/17/2024)   Humiliation, Afraid, Rape, and Kick questionnaire    Fear of Current or Ex-Partner: No    Emotionally Abused: No    Physically Abused: No    Sexually Abused: No   Outpatient  Medications Prior to Visit  Medication Sig Dispense Refill   aspirin  EC 81 MG tablet Take 1 tablet (81 mg total) by mouth daily. Swallow whole. 90 tablet 3   cyclobenzaprine  (FLEXERIL ) 5 MG tablet Take 1 tablet (5 mg total) by mouth 3 (three) times daily as needed for muscle spasms. 30 tablet 0   dapagliflozin  propanediol (FARXIGA ) 10 MG TABS tablet Take 1 tablet (10 mg total) by mouth daily. 30 tablet 5   diclofenac (VOLTAREN) 75 MG EC tablet Take 75 mg by mouth daily.     furosemide  (LASIX ) 40 MG tablet Take 1 tablet (40 mg total) by mouth as needed for fluid or edema. 30 tablet 2   KLOR-CON  M20 20 MEQ tablet Take 20 mEq by mouth daily.     losartan  (COZAAR ) 50 MG tablet Take 1 tablet (50 mg total) by mouth daily. 90 tablet 3   magnesium  oxide (MAG-OX) 400 (240 Mg) MG tablet Take 1  tablet (400 mg total) by mouth daily. 10 tablet 0   metoprolol  succinate (TOPROL -XL) 50 MG 24 hr tablet Take 1 tablet (50 mg total) by mouth daily. Take with or immediately following a meal. 90 tablet 3   spironolactone  (ALDACTONE ) 25 MG tablet Take 0.5 tablets (12.5 mg total) by mouth daily. 45 tablet 3   rosuvastatin  (CRESTOR ) 40 MG tablet Take 1 tablet (40 mg total) by mouth daily. 30 tablet 3   No facility-administered medications prior to visit.   Allergies  Allergen Reactions   Penicillins Hives   Principen [Ampicillin] Hives    ROS: A complete ROS was performed with pertinent positives/negatives noted in the HPI. The remainder of the ROS are negative.   Objective:   Today's Vitals   06/01/24 1305 06/01/24 1333  BP: (!) 144/89 130/86  Pulse: 64   SpO2: 98%   Weight: 239 lb (108.4 kg)   Height: 6' 1 (1.854 m)     GENERAL: Well-appearing, in NAD. Well nourished.  SKIN: Pink, warm and dry. Pustular rash present to bilateral aspects of chest with multiple pustules with clear and white drainage with mild erythema. Midline chest surgical scar present without irritation or erythema.  Head:  Normocephalic. NECK: Trachea midline. Full ROM w/o pain or tenderness.  RESPIRATORY: Chest wall symmetrical. Respirations even and non-labored. Breath sounds clear to auscultation bilaterally.  CARDIAC: S1, S2 present, regular rate and rhythm without murmur or gallops. Peripheral pulses 2+ bilaterally.  MSK: Muscle tone and strength appropriate for age. Small palpable bony deformity present to anteior right wrist upon flexion, reducible with extension.   EXTREMITIES: Without clubbing, cyanosis, or edema.  NEUROLOGIC: No motor or sensory deficits. Steady, even gait. C2-C12 intact.  PSYCH/MENTAL STATUS: Alert, oriented x 3. Cooperative, appropriate mood and affect.   No results found for any visits on 06/01/24.     Assessment & Plan:  1. Encounter to establish care with new doctor (Primary) Discussed role of PCP and reviewed his medical, surgical, and social history.   2. Screening for colon cancer - Cologuard  3. Right wrist deformity Possible ganglion cyst versus bony prominence. Will obtain xray to confirm.  - DG Wrist Complete Right; Future  4. Folliculitis Presenting as folliculitis and likely due to hair regrowth from shaving due to surgical procedure. Start Doxy 100mg  BID and Mupirocin TID x 7 days. Use benzoyl peroxide creamy wash 2-3x a week. - mupirocin ointment (BACTROBAN) 2 %; Apply to affected area three times a day for 7 days.  Dispense: 30 g; Refill: 3 - doxycycline  (VIBRA -TABS) 100 MG tablet; Take 1 tablet (100 mg total) by mouth 2 (two) times daily.  Dispense: 14 tablet; Refill: 0  5. S/P ascending aortic replacement 6. NICM (nonischemic cardiomyopathy) (HCC) Continue with management by cardiothoracic surgery and cardiology. Hospital course and recent office visits reviewed.    Patient to reach out to office if new, worrisome, or unresolved symptoms arise or if no improvement in patient's condition. Patient verbalized understanding and is agreeable to treatment plan.  All questions answered to patient's satisfaction.    Return in about 6 months (around 11/29/2024) for ANNUAL PHYSICAL.    Thersia Schuyler Stark, [place]

## 2024-06-01 NOTE — Patient Instructions (Signed)
 Benzoyl Peroxide creamy Wash - Panoxyl- in acne facewash aisle  Start by using twice weekly and decrease use if irritatoin/dryness occurs.

## 2024-06-07 ENCOUNTER — Telehealth (HOSPITAL_COMMUNITY): Payer: Self-pay

## 2024-06-07 ENCOUNTER — Encounter (HOSPITAL_COMMUNITY): Payer: Self-pay

## 2024-06-07 ENCOUNTER — Ambulatory Visit (HOSPITAL_COMMUNITY)
Admission: RE | Admit: 2024-06-07 | Discharge: 2024-06-07 | Disposition: A | Discharge status: Home / Self Care | Source: Ambulatory Visit | Attending: Thoracic Surgery (Cardiothoracic Vascular Surgery) | Admitting: Thoracic Surgery (Cardiothoracic Vascular Surgery)

## 2024-06-07 DIAGNOSIS — I7100 Dissection of unspecified site of aorta: Secondary | ICD-10-CM | POA: Diagnosis present

## 2024-06-07 MED ORDER — IOHEXOL 350 MG/ML SOLN
75.0000 mL | Freq: Once | INTRAVENOUS | Status: AC | PRN
  Administered 2024-06-07: 75 mL via INTRAVENOUS

## 2024-06-09 ENCOUNTER — Other Ambulatory Visit (HOSPITAL_COMMUNITY): Payer: Self-pay | Admitting: Cardiology

## 2024-06-09 ENCOUNTER — Ambulatory Visit (HOSPITAL_COMMUNITY)
Admission: RE | Admit: 2024-06-09 | Discharge: 2024-06-09 | Disposition: A | Discharge status: Home / Self Care | Source: Ambulatory Visit | Attending: Cardiology | Admitting: Cardiology

## 2024-06-09 DIAGNOSIS — I5020 Unspecified systolic (congestive) heart failure: Secondary | ICD-10-CM | POA: Diagnosis not present

## 2024-06-09 MED ORDER — GADOBUTROL 1 MMOL/ML IV SOLN
10.0000 mL | Freq: Once | INTRAVENOUS | Status: AC | PRN
  Administered 2024-06-09: 10 mL via INTRAVENOUS

## 2024-06-22 ENCOUNTER — Ambulatory Visit

## 2024-06-22 VITALS — BP 130/76 | HR 74 | Resp 20 | Ht 73.0 in | Wt 247.0 lb

## 2024-06-22 DIAGNOSIS — I7100 Dissection of unspecified site of aorta: Secondary | ICD-10-CM

## 2024-06-22 DIAGNOSIS — Z95828 Presence of other vascular implants and grafts: Secondary | ICD-10-CM

## 2024-06-22 NOTE — Patient Instructions (Signed)
-  Follow up in 6 months with CTA of chest  -Continue control of blood pressure (prefer BP 130/80 or less) -Exercise and activity limitations is individualized, but in general, contact sports are to be avoided and one should avoid heavy lifting (defined as half of ideal body weight) and exercises involving sustained Valsalva maneuver.

## 2024-06-22 NOTE — Progress Notes (Signed)
 [address] [place] [address]             [phone]       HPI:  Patient returns for routine postoperative follow-up having undergone valve sparing root repair and replacement of the ascending aorta and CABG x 1 on 01/08/2024 with Dr. 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 He left AMA during this admission.  He was last seen in clinic on 02/17/2024 and since that visit he has been doing well.  His blood pressure is well controlled with current medication therapy and he is compliant with medications.  He is checking his blood pressure at home and it is 110-130s/80s. He had returned to work without issue. He denies chest pain, shortness of breath and lower leg edema.    Allergies as of 06/22/2024       Reactions   Penicillins Hives   Principen [ampicillin] Hives        Medication List        Accurate as of June 22, 2024 11:26 AM. If you have any questions, ask your nurse or doctor.          aspirin  EC 81 MG tablet Take 1 tablet (81 mg total) by mouth daily. Swallow whole.   cyclobenzaprine  5 MG tablet Commonly known as: FLEXERIL  Take 1 tablet (5 mg total) by mouth 3 (three) times daily as needed for muscle spasms.   dapagliflozin  propanediol 10 MG Tabs tablet Commonly known as: FARXIGA  Take 1 tablet (10 mg total) by mouth daily.   diclofenac  75 MG EC tablet Commonly known as: VOLTAREN  Take 75 mg by mouth daily.   doxycycline  100 MG tablet Commonly known as: VIBRA -TABS Take 1 tablet (100 mg total) by mouth 2 (two) times daily.   furosemide  40 MG tablet Commonly known as: Lasix  Take 1 tablet (40 mg total) by mouth as needed for fluid or edema.   Klor-Con  M20 20 MEQ tablet Generic drug: potassium chloride  SA Take 20 mEq by mouth daily.   losartan  50 MG tablet Commonly known as: COZAAR  Take 1 tablet (50 mg total) by mouth daily.   magnesium  oxide 400 (240 Mg) MG tablet Commonly known as:  MAG-OX Take 1 tablet (400 mg total) by mouth daily.   metoprolol  succinate 50 MG 24 hr tablet Commonly known as: TOPROL -XL Take 1 tablet (50 mg total) by mouth daily. Take with or immediately following a meal.   mupirocin  ointment 2 % Commonly known as: BACTROBAN  Apply to affected area three times a day for 7 days.   rosuvastatin  40 MG tablet Commonly known as: Crestor  Take 1 tablet (40 mg total) by mouth daily.   spironolactone  25 MG tablet Commonly known as: ALDACTONE  Take 0.5 tablets (12.5 mg total) by mouth daily.         ROS  Review of Systems  Constitutional: Negative.  Negative for malaise/fatigue.  Respiratory: Negative.  Negative for cough, shortness of breath and wheezing.   Cardiovascular: Negative.  Negative for chest pain, palpitations and leg swelling.     BP 130/76   Pulse 74   Resp 20   Ht 6' 1 (1.854 m)   Wt 247 lb (112 kg)   SpO2 97% Comment: RA  BMI 32.59 kg/m    Physical Exam Constitutional:      Appearance: Normal appearance.  HENT:     Head: Normocephalic and atraumatic.  Cardiovascular:  Rate and Rhythm: Normal rate and regular rhythm.     Heart sounds: Normal heart sounds, S1 normal and S2 normal.  Pulmonary:     Effort: Pulmonary effort is normal.     Breath sounds: Normal breath sounds.  Skin:    General: Skin is warm and dry.  Neurological:     General: No focal deficit present.     Mental Status: He is alert and oriented to person, place, and time.      Imaging: EXAM: CTA CHEST 06/07/2024 04:54:07 PM   TECHNIQUE: CTA of the chest was performed without and with the administration of 75 mL iohexol  (OMNIPAQUE ) 350 MG/ML injection. Multiplanar reformatted images are provided for review. MIP images are provided for review. Automated exposure control, iterative reconstruction, and/or weight based adjustment of the mA/kV was utilized to reduce the radiation dose to as low as reasonably achievable.    COMPARISON: Comparison with prior chest radiograph 03/08/2024 and CT chest abdomen and pelvis 01/07/2024.   CLINICAL HISTORY: dissecting aortic aneurysm   FINDINGS:   PULMONARY ARTERIES: Visualized central pulmonary arteries are patent without evidence of significant pulmonary embolus. Main pulmonary artery is normal in caliber.   MEDIASTINUM: Postoperative changes in the mediastinum with median sternotomy, sternotomy wires, and ascending thoracic aortic repair. No residual or recurrent dissection. Scattered calcification in the aorta. Great vessel origins are patent. The heart is enlarged. No pericardial effusions. Coronary artery calcifications.   LYMPH NODES: No mediastinal, hilar or axillary lymphadenopathy.   LUNGS AND PLEURA: Emphysematous changes in the lung apices. No airspace disease or consolidation in the lungs. Focal pulmonary nodule in the right lower lobe, image 108, measuring 8 mm diameter. This is new since the previous study. Due to the short interval time frame of development, this most likely represents focal atelectasis. No evidence of pleural effusion or pneumothorax.   UPPER ABDOMEN: Limited images of the upper abdomen are unremarkable.   SOFT TISSUES AND BONES: Degenerative changes in the spine. No acute bone or soft tissue abnormality.   IMPRESSION: 1. No residual or recurrent dissection of the ascending thoracic aorta postoperative repair . 2. New 8 mm right lower lobe pulmonary nodule, likely focal atelectasis given rapid interval appearance.   Electronically signed by: Elsie Gravely MD 06/07/2024 05:44 PM EST RP Workstation: [id number]   Assessment/Plan:  S/P ascending aortic replacement -We reviewed CTA of chest which showed postoperative changes to ascending thoracic aorta without residual or recurrent dissection -Discussed the importance of blood pressure control.  He is compliant with all medications.  He is to continue with home  blood pressure readings and report readings 140s or higher/90s or higher to his cardiologist for possible medication adjustments -Discussed avoidance of contact sports, heavy lifting and exercises involving sustained Valsalva maneuver -New pulmonary nodule seen on CTA of chest, will continue to monitor with follow up CT scan -Follow up in 6 months with CTA of chest for continued ascending aortic replacement surveillance     Manuelita CHRISTELLA Rough, PA-C 11:26 AM 06/22/24

## 2024-06-23 ENCOUNTER — Encounter (HOSPITAL_BASED_OUTPATIENT_CLINIC_OR_DEPARTMENT_OTHER): Payer: Self-pay

## 2024-07-19 ENCOUNTER — Other Ambulatory Visit: Payer: Self-pay | Admitting: Cardiology

## 2024-08-10 ENCOUNTER — Ambulatory Visit (HOSPITAL_COMMUNITY): Payer: Self-pay | Admitting: Cardiology

## 2024-11-29 ENCOUNTER — Encounter (HOSPITAL_BASED_OUTPATIENT_CLINIC_OR_DEPARTMENT_OTHER): Admitting: Family Medicine

## 2024-11-30 ENCOUNTER — Encounter (HOSPITAL_BASED_OUTPATIENT_CLINIC_OR_DEPARTMENT_OTHER): Admitting: Family Medicine
